# Patient Record
Sex: Female | Born: 1990 | Race: White | Hispanic: No | Marital: Single | Smoking: Never smoker
Health system: Southern US, Community
[De-identification: ages and names within clinical notes are randomized; demographics above are authoritative.]

## PROBLEM LIST (undated history)

## (undated) DIAGNOSIS — J45909 Unspecified asthma, uncomplicated: Secondary | ICD-10-CM

## (undated) DIAGNOSIS — N200 Calculus of kidney: Secondary | ICD-10-CM

## (undated) DIAGNOSIS — K922 Gastrointestinal hemorrhage, unspecified: Secondary | ICD-10-CM

## (undated) DIAGNOSIS — F419 Anxiety disorder, unspecified: Secondary | ICD-10-CM

## (undated) DIAGNOSIS — M797 Fibromyalgia: Secondary | ICD-10-CM

## (undated) DIAGNOSIS — F603 Borderline personality disorder: Secondary | ICD-10-CM

## (undated) DIAGNOSIS — E039 Hypothyroidism, unspecified: Secondary | ICD-10-CM

## (undated) DIAGNOSIS — F1410 Cocaine abuse, uncomplicated: Secondary | ICD-10-CM

## (undated) DIAGNOSIS — N760 Acute vaginitis: Secondary | ICD-10-CM

## (undated) DIAGNOSIS — F1290 Cannabis use, unspecified, uncomplicated: Secondary | ICD-10-CM

## (undated) DIAGNOSIS — E559 Vitamin D deficiency, unspecified: Secondary | ICD-10-CM

## (undated) DIAGNOSIS — G43909 Migraine, unspecified, not intractable, without status migrainosus: Secondary | ICD-10-CM

## (undated) DIAGNOSIS — U071 COVID-19: Secondary | ICD-10-CM

## (undated) DIAGNOSIS — R569 Unspecified convulsions: Secondary | ICD-10-CM

## (undated) DIAGNOSIS — F32A Depression, unspecified: Secondary | ICD-10-CM

## (undated) DIAGNOSIS — N189 Chronic kidney disease, unspecified: Secondary | ICD-10-CM

## (undated) HISTORY — PX: BUNIONECTOMY: SHX129

## (undated) HISTORY — PX: APPENDECTOMY: SHX54

## (undated) HISTORY — DX: Unspecified asthma, uncomplicated: J45.909

## (undated) HISTORY — PX: COLONOSCOPY: SHX174

## (undated) HISTORY — DX: Chronic kidney disease, unspecified: N18.9

## (undated) HISTORY — PX: OTHER SURGICAL HISTORY: SHX169

## (undated) HISTORY — DX: Depression, unspecified: F32.A

## (undated) HISTORY — DX: Fibromyalgia: M79.7

## (undated) SURGERY — Surgical Case
Anesthesia: *Unknown

## (undated) NOTE — Progress Notes (Signed)
Formatting of this note might be different from the original.  Chaplain met with pt in waiting room. Pt was tucked in a corner, waiting for more treatment. Pt was frustrated at what she considered lack of care or understanding from associates. Pt described herself as spiritual, and asked for prayer. Chaplain listened, offered words of encouragement and prayer for her circumstances.  Electronically signed by Fenton Malling, Chaplain at 06/14/2021  2:57 PM EST

## (undated) NOTE — ED Triage Notes (Signed)
Formatting of this note might be different from the original.  Pt c/o 10/10 abd pain after IUD placement.   Electronically signed by Dorothyann Gibbs, RN at 06/14/2021 10:30 AM EST

## (undated) NOTE — ED Notes (Signed)
Formatting of this note might be different from the original.  While getting her discharge paperwork and instructions pt walked out of room.     Dorothyann Gibbs, RN  06/14/21 1622    Electronically signed by Dorothyann Gibbs, RN at 06/14/2021  4:22 PM EST

## (undated) NOTE — ED Provider Notes (Signed)
Formatting of this note is different from the original.  Images from the original note were not included.      Patient Name:         Casey Hughes  Date of Birth:           03-08-1991  Medical Record #:   [id number]     HPI     Chief Complaint   Patient presents with   ? Abdominal Pain     63 year old female history of polysubstance abuse currently at [facility] heroin and cocaine abuse, fibromyalgia, anxiety depression, endometriosis presents emergency department today with lower pelvic/abdominal discomfort.  Patient states that last Thursday she recently had an IUD placed for "hormonal control" and since then she has been having excruciating "10/10" pain in her lower abdominal/pelvic area.  She states "my pain is so bad that I cannot walk".  Denies any associated fever, chills, shortness of breath, chest pain, nausea vomiting, dysuria, increased urinary frequency, melena, bloody stools, paresthesias in upper or lower extremities.  Patient was able to ambulate from waiting room to exam room as witnessed by Clinical research associate.  He states that she is followed by Dr. Luan Pulling OB/GYN "back at home".     Patient History     Allergies   Allergen Reactions   ? Ciprofloxacin Nausea   ? Lactose Diarrhea     Prior to Admission medications    Medication Sig Start Date End Date Taking? Authorizing Provider   acetaminophen (Tylenol) 500 mg tablet Take by mouth every 6 (six) hours if needed for mild pain (pain scale 1-3).    Historical Chs Provider   amphetamine-dextroamphetamine XR (Adderall XR) 10 mg 24 hr capsule Take 20 mg by mouth 1 (one) time each day in the morning. Do not crush or chew.    Historical Chs Provider   ARIPiprazole (Abilify) 10 mg tablet Take 10 mg by mouth 1 (one) time each day.    Historical Chs Provider   baclofen (Lioresal) 10 mg tablet Take by mouth 2 (two) times a day.    Historical Chs Provider   baclofen (Lioresal) 5 mg tablet tablet  10/28/19   Historical Chs Provider   cefdinir (Omnicef) 300 mg capsule  11/18/19    Historical Chs Provider   cetirizine 10 mg capsule Take 10 mg by mouth 1 (one) time each day if needed. ALLERGIES    Historical Chs Provider   clonazePAM (KlonoPIN) 0.5 mg tablet Take 0.5 mg by mouth at night if needed for anxiety.    Historical Chs Provider   DULoxetine (Cymbalta) 60 mg DR capsule Take 60 mg by mouth 1 (one) time each day. Do not crush or chew.    Historical Chs Provider   famotidine (Pepcid) 20 mg tablet Take 20 mg by mouth 2 (two) times a day if needed for heartburn.    Historical Chs Provider   fluticasone (Flonase) 50 mcg/actuation nasal spray Administer 1 spray into each nostril 1 (one) time each day. Shake gently. Before first use, prime pump. After use, clean tip and replace cap.    Historical Chs Provider   ibuprofen (Advil,Motrin) 600 mg tablet Take 600 mg by mouth every 8 (eight) hours if needed for mild pain (pain scale 1-3).    Historical Chs Provider   levonorgestrel [facility] UTRN) by intrauterine route 2 (two) times a day.    Historical Chs Provider   phenol (phenol) 1.4 % aerosol,spray Use in the mouth or throat.    Historical Chs Provider  prazosin (Minipress) 2 mg capsule Take 6 mg by mouth every night.    Historical Chs Provider   traMADoL (Ultram) 50 mg tablet  11/05/19   Historical Chs Provider   traZODone (Desyrel) 150 mg tablet Take 150 mg by mouth every night.    Historical Chs Provider     Past Medical History:   Diagnosis Date   ? Anxiety 11/07/2019   ? Depression 11/07/2019   ? Endometriosis    ? Endometriosis    ? Fibromyalgia    ? Fibromyalgia    ? Hx of cocaine abuse (CMS/HCC) 11/07/2019     Past Surgical History:   Procedure Laterality Date   ? APPENDECTOMY     ? FOOT SURGERY Left    ? KIDNEY STONE SURGERY       Family History   Problem Relation Name Age of Onset   ? Cervical cancer Mother     ? No Known Problems Father       Social History     Tobacco Use   ? Smoking status: Every Day     Packs/day: 0.25     Types: Cigarettes   ? Smokeless tobacco: Never   Substance Use  Topics   ? Alcohol use: Not Currently   ? Drug use: Not Currently     Types: Cocaine, Other     Comment: methadone      Review of Systems     Review of Systems   Constitutional: Negative for chills and fever.   HENT: Negative for ear pain and sore throat.    Eyes: Negative for pain and visual disturbance.   Respiratory: Negative for cough and shortness of breath.    Cardiovascular: Negative for chest pain and palpitations.   Gastrointestinal: Negative for abdominal pain, nausea and vomiting.   Genitourinary: Positive for pelvic pain. Negative for dysuria and hematuria.   Musculoskeletal: Negative for arthralgias and back pain.   Skin: Negative for color change and rash.   Neurological: Negative for seizures and syncope.   All other systems reviewed and are negative.     Physical Exam     ED Triage Vitals [06/14/21 1027]   Temp Pulse Resp BP   36.1 C (96.9 F) (!) 107 18 (!) 142/90     SpO2 Temp src Heart Rate Source Patient Position   97 % Temporal Oxygen saturation monitor Sitting     BP Location FiO2 (%)     Left arm --       Physical Exam  Vitals and nursing note reviewed.   Constitutional:       General: She is not in acute distress.     Appearance: She is well-developed. She is obese. She is not ill-appearing.   HENT:      Head: Normocephalic and atraumatic.   Eyes:      Conjunctiva/sclera: Conjunctivae normal.   Cardiovascular:      Rate and Rhythm: Normal rate and regular rhythm.      Heart sounds: No murmur heard.  Pulmonary:      Effort: Pulmonary effort is normal. No respiratory distress.      Breath sounds: Normal breath sounds.   Abdominal:      Palpations: Abdomen is soft.      Tenderness: There is abdominal tenderness in the suprapubic area.   Musculoskeletal:         General: No swelling.      Cervical back: Neck supple.   Skin:     General:  Skin is warm and dry.      Capillary Refill: Capillary refill takes less than 2 seconds.   Neurological:      Mental Status: She is alert and oriented to person,  place, and time.   Psychiatric:         Mood and Affect: Mood normal.         ED Course as of 06/14/21 1619   Mon Jun 14, 2021   1614 On reevaluation, patient was sitting in the corner of the waiting room and was able to ambulate all the way to the exam room without any difficulty and steady gait.  I thoroughly discussed the results of her labs and imaging as they are negative for any acute findings.  Discussed the results of blood work as her LFTs are within normal limits. Patient requesting doctor as she states "I dont believe my results by a nurse". Patient again was told her results with chaperone Randolph Bing at bedside with Clinical research associate.  Daine Floras   [phone] Counseled patient about diagnosis, the need for appropriate outpatient follow-up, return to the emergency department if symptoms worsen or persist or if there are any questions or concerns that arise at home.  After thorough patient history, exam, diagnosis, and evaluation there is no indication for emergent intervention or inpatient treatment at this time.  It is understood by the patient and/or guardian that if the symptoms persist or worsening to return immediately to the emergency room for evaluation.  I discussed with the patient/guardian the need to see primary care provider for further evaluation of symptoms.    Dragon voice recognition was used to prepare this note.  Although notes are reviewed for syntactic and/or grammatical errors, unintended but conspicuous translational errors can occur.  Please call if there are any questions about the contents of this note. [SQ]     ED Course User Index  [SQ] Letta Moynahan, NP       Clinical Impressions as of 06/14/21 1619   Generalized abdominal pain     Procedures    BP: (!) 142/90 (1027)  Pulse: (!) 107 (1027)  Resp: 18 (1027)  Temp: 36.1 C (96.9 F) (1027)  Are rigors present?: No (1030)  Is there a suspected infection?: No (1030)  Is the patient's mental status altered?: No (1030)        MDM    CT lumbar spine  reconstruction from abdomen statistical   Final Result   No acute findings     To comply with MIPS quality improvement reporting purposes, Amy described simple appearing hepatic or renal cyst (Bosniak one or 2), subcentimeter adrenal nodules, and adrenal adenomas with classic imaging features are considered benign findings into groin no follow-up imaging.     Reading Location:  [id number]     Dictated By: Mary Sella     Dictated Date/Time: 06/14/2021 3:56 PM     Electronically Signed By: Mary Sella     Signed Date/Time:  06/14/2021 4:02 PM         CT abdomen pelvis with IV contrast only [id number]   Final Result   No acute problems noted. Horseshoe kidney. IUD. Additional as above.     Reading Location:  [facility]     Dictated By: Kirk Ruths     Dictated Date/Time: 06/14/2021 3:55 PM     Electronically Signed By: Kirk Ruths     Signed Date/Time:  06/14/2021 4:02 PM     Radimetrics Dose Report^Up-to-date CT equipment and radiation  dose reduction techniques were employed. CTDIvol: 60.0 mGy. DLP: 3141 mGy-cm.     Ultrasound transvaginal non-obstetric 16109   Final Result   1. There is an IUD within the endometrial canal as expected.   2. Otherwise, unremarkable pelvic ultrasound.   3. Consider CT follow-up if symptoms persist or worsen.     Reading Location:  [id number]     Dictated By: Estrella Deeds     Dictated Date/Time: 06/14/2021 11:14 AM     Electronically Signed By: Estrella Deeds     Signed Date/Time:  06/14/2021 11:16 AM           89 year old female, HPI as above.  When seen at bedside discussed the results of ultrasound with patient as IUD is in endometrial canal as expected.  She was offered blood work and CT of the abdomen pelvis.  She is also offered pain medication however patient declined, stating that "Toradol is the same as ibuprofen and Tylenol and my doctor told me that had an enlarged liver and I cannot take Tylenol".  Patient also states "I feel like you are not giving me stronger  medication because you know I come from rehab and I feel like you think I am wasting my time".  Patient was corrected and I did discuss that I did offer her medication imaging and blood work and words such as "wasting my time" never came from Clinical research associate. States "I cant get tylenol because I was told my liver is enlarged". On review LFTs wnl. Dispo pending.    Labs Reviewed   COMPREHENSIVE METABOLIC PANEL - Abnormal       Result Value    Glucose 81      BUN 11      Creatinine, Serum 0.51 (*)     Bun/Creatinine Ratio 21.6 (*)     Sodium 137      Potassium 4.4      Chloride 102      CO2 27      Anion Gap 8      Calcium 8.9      Aspartate Aminotranferase (AST) 25      Alanine Aminotransferase (ALT) 19      Alkaline Phosphatase 97      Protein, Total 7.3      Albumin 3.9      Albumin/Globulin Ratio 1.1      Bilirubin, Total 0.3      GFR >90.0     CBC WITH AUTODIFF - Abnormal    WBC (White Blood Cell) Count 8.3      RBC 4.08 (*)     Hemoglobin 12.6      Hematocrit 37.0      MCV 90.8      MCH 30.9      MCHC 34.0      RDW 13.8      Platelets 268      MPV 6.8 (*)     Neutrophils Relative 63      Lymphocytes Relative 27      Monocytes Relative 7      Eosinophils Relative 3      Basophils Relative 1      Neutrophils Absolute 5.2      Lymphocytes Absolute 2.2      Monocytes Absolute 0.6      Eosinophils Absolute 0.2      Basophils Absolute 0.1     URINALYSIS WITH URINE CULTURE REFLEX - Abnormal    Urine Source Urine, Clean Catch  Urine Color Yellow      Urine Appearance Clear      Glucose, Urine Negative      Ketones, Urine Negative      Blood, Urine Trace (*)     Protein, Urine Negative      Nitrites, Urine Negative      Bilirubin, Urine Negative      Specific Gravity 1.025      pH, Urine 6.0      Urobilinogen, Urine 0.2      Leukocyte Esterase Negative     MICROSCOPIC REFLEX - Abnormal    Red Blood Cells, Urine 3-5 (*)     White Blood Cells, Urine 0-2      Bacteria Few (*)     Squamous Epithelial Cells 6-10 (*)    URINALYSIS  W/URINE CULTURE REFLEX    Narrative:     The following orders were created for panel order Urinalysis with Urine Culture Reflex.  Procedure                               Abnormality         Status                     ---------                               -----------         ------                     Wallace Cullens top tube (urine micro)[[id number]]                       In process                 Urinalysis with Urine Cul.Marland KitchenMarland Kitchen[[id number]]  Abnormal            Final result               Clear top tube (urine add.Marland KitchenMarland Kitchen[[id number]]                      In process                   Please view results for these tests on the individual orders.   GRAY TOP TUBE (URINE MICRO)   CLEAR TOP TUBE       CT lumbar spine reconstruction from abdomen statistical   Final Result   No acute findings     To comply with MIPS quality improvement reporting purposes, Amy described simple appearing hepatic or renal cyst (Bosniak one or 2), subcentimeter adrenal nodules, and adrenal adenomas with classic imaging features are considered benign findings into groin no follow-up imaging.     Reading Location:  [id number]     Dictated By: Mary Sella     Dictated Date/Time: 06/14/2021 3:56 PM     Electronically Signed By: Mary Sella     Signed Date/Time:  06/14/2021 4:02 PM         CT abdomen pelvis with IV contrast only [id number]   Final Result   No acute problems noted. Horseshoe kidney. IUD. Additional as above.     Reading Location:  [facility]     Dictated By: Kirk Ruths     Dictated Date/Time: 06/14/2021 3:55 PM  Electronically Signed By: Kirk Ruths     Signed Date/Time:  06/14/2021 4:02 PM     Radimetrics Dose Report^Up-to-date CT equipment and radiation dose reduction techniques were employed. CTDIvol: 60.0 mGy. DLP: 3141 mGy-cm.     Ultrasound transvaginal non-obstetric 16109   Final Result   1. There is an IUD within the endometrial canal as expected.   2. Otherwise, unremarkable pelvic ultrasound.   3. Consider CT follow-up if symptoms  persist or worsen.     Reading Location:  [id number]     Dictated By: Estrella Deeds     Dictated Date/Time: 06/14/2021 11:14 AM     Electronically Signed By: Estrella Deeds     Signed Date/Time:  06/14/2021 11:16 AM           Labs Reviewed   COMPREHENSIVE METABOLIC PANEL - Abnormal       Result Value    Glucose 81      BUN 11      Creatinine, Serum 0.51 (*)     Bun/Creatinine Ratio 21.6 (*)     Sodium 137      Potassium 4.4      Chloride 102      CO2 27      Anion Gap 8      Calcium 8.9      Aspartate Aminotranferase (AST) 25      Alanine Aminotransferase (ALT) 19      Alkaline Phosphatase 97      Protein, Total 7.3      Albumin 3.9      Albumin/Globulin Ratio 1.1      Bilirubin, Total 0.3      GFR >90.0     CBC WITH AUTODIFF - Abnormal    WBC (White Blood Cell) Count 8.3      RBC 4.08 (*)     Hemoglobin 12.6      Hematocrit 37.0      MCV 90.8      MCH 30.9      MCHC 34.0      RDW 13.8      Platelets 268      MPV 6.8 (*)     Neutrophils Relative 63      Lymphocytes Relative 27      Monocytes Relative 7      Eosinophils Relative 3      Basophils Relative 1      Neutrophils Absolute 5.2      Lymphocytes Absolute 2.2      Monocytes Absolute 0.6      Eosinophils Absolute 0.2      Basophils Absolute 0.1     URINALYSIS WITH URINE CULTURE REFLEX - Abnormal    Urine Source Urine, Clean Catch      Urine Color Yellow      Urine Appearance Clear      Glucose, Urine Negative      Ketones, Urine Negative      Blood, Urine Trace (*)     Protein, Urine Negative      Nitrites, Urine Negative      Bilirubin, Urine Negative      Specific Gravity 1.025      pH, Urine 6.0      Urobilinogen, Urine 0.2      Leukocyte Esterase Negative     MICROSCOPIC REFLEX - Abnormal    Red Blood Cells, Urine 3-5 (*)     White Blood Cells, Urine 0-2      Bacteria Few (*)  Squamous Epithelial Cells 6-10 (*)    URINALYSIS W/URINE CULTURE REFLEX    Narrative:     The following orders were created for panel order Urinalysis with Urine Culture  Reflex.  Procedure                               Abnormality         Status                     ---------                               -----------         ------                     Wallace Cullens top tube (urine micro)[[id number]]                       In process                 Urinalysis with Urine Cul.Marland KitchenMarland Kitchen[[id number]]  Abnormal            Final result               Clear top tube (urine add.Marland KitchenMarland Kitchen[[id number]]                      In process                   Please view results for these tests on the individual orders.   GRAY TOP TUBE (URINE MICRO)   CLEAR TOP TUBE     See ED course.                             Electronically signed by:    Letta Moynahan, NP  06/14/21 1619    Electronically signed by Lewie Chamber, MD at 06/14/2021  4:39 PM EST

---
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
## 2011-07-26 DIAGNOSIS — I471 Supraventricular tachycardia, unspecified: Secondary | ICD-10-CM

## 2011-07-26 HISTORY — DX: Supraventricular tachycardia, unspecified: I47.10

## 2011-07-26 HISTORY — DX: Supraventricular tachycardia: I47.1

## 2011-08-30 ENCOUNTER — Ambulatory Visit: Payer: Self-pay | Admitting: Cardiology

## 2011-08-30 ENCOUNTER — Encounter: Payer: Self-pay | Admitting: Gastroenterology

## 2011-10-04 ENCOUNTER — Ambulatory Visit: Payer: Self-pay | Admitting: Cardiology

## 2011-10-04 ENCOUNTER — Encounter: Payer: Self-pay | Admitting: Cardiology

## 2011-10-04 VITALS — BP 102/70 | HR 82 | Ht 65.5 in | Wt 199.0 lb

## 2011-10-04 DIAGNOSIS — I471 Supraventricular tachycardia: Secondary | ICD-10-CM | POA: Insufficient documentation

## 2011-10-04 NOTE — Progress Notes (Signed)
Recent Hospitalizations / ED Visits / Illnesses:  []  Yes [x]  No      Caffeine Intake:  [x]  Yes []  No  Coffee, hot tea, coke/pepsi totaling 2 small cups per day.    Diet:           Exercise:         Cardiovascular  Chest Discomfort [x]  Yes []  No   []  At rest    [x]  With exertion/ at work.  Has not happened in a month or more   []  At night   [x]  Random (no pattern)   Location: mid chest   Duration:    Pain moves to: arm   Type of pain: heavy   Other symptoms with chest pain: dizziness, feels hot and sweaty,    What do you take for the pain: nothing   Last Taken:    Swelling in Legs, Feet or Ankles:  [x]  Yes  With exercise knees go thot and swell get "itchy" []  No   Palpitations: []  Yes []  No   []  Heart Skips   [x]  Heart Races  Last episode end of February.  Chest pain with/burning with arm weakness and diaphoresis.  Pounding/fast HR.  Typical episode comes on while working at [employer].  Coming on suddenly.  Sometimes when bending over.  Takes about 5 minutes to subside, tries drinking/splashing cold water.  Takes 10 minutes to go away.  Feels weak and sick afterwards.    Has had palpitations 10 years ago.  But no symptoms since then until August-Nov.    Respiratory   Difficulty breathing []  Yes [x]  No   []  When lying down   []  At night   Shortness of Breath []  Yes []  No   [x]  with exertion/sensation in chest   []  At rest   Do you snore: []  Yes [x]  No   []  Stop breathing?   []  Excessive daytime fatigue  General   [x]  Fatigue-loggy a lot [x]  Yes []  No   [x]  Weight Gain > 10# in 29yr. States weight waxes and wanes and that this past week she "lost 12 pounds" []  Yes []  No   []  Weight Loss > 10# in 1 yr. []  Yes []  No  Neurologic   []  Fainting, dizziness []  Yes [x]  No   []  Lightheadedness-working/exertion [x]  Yes []  No   []  Blackout, near blackout []  Yes []  No   []  Anxiety [x]  Yes []  No   []  Headache []  Yes []  No   []  Depression []  Yes []  No   []  Seizures []  Yes []  No   []  Memory Loss []  Yes []  No   []  Change in sensation on  body []  Yes []  No   []  Localized weakness/numbness []  Yes []  No   []  Head Injury [x]  Yes  Mild concussion 2years ago []  No  Endocrine   []  Change in skin texture []  Yes []  No   []  Cold intolerance []  Yes []  No   []  Heat Intolerance []  Yes []  No   []  Diabetes []  Yes [x]  No  Eyes  NONE   []  Glaucoma []  Yes []  No   []  Double Vision []  Yes []  No   []  Blurred Vision []  Yes []  No  Skin  NONE    []  Excessive Sweating []  Yes []  No   []  Oily Skin []  Yes []  No   []  Dry Skin []  Yes []  No  Vascular   Pain in calves while walking []  Yes [  x] No  Gastrointestinal     [x]  Indigestion    [x]  Heartburn    [x]  Reflux    []  Ulcers    []  Hiatal Hernia    []  Gallstones    []  Appetite []  Increase []  Decrease     Bowels   [x]  Regular    []  Irregular    []  Constipation    []  Diarrhea   Urinary  NONE   []  Excessive thirst    []  urinating frequently (day)    []  urinating frequently (night)     Upcoming Surgery: NONE  Date of Surgery:   Surgeon:

## 2011-10-04 NOTE — Progress Notes (Signed)
CARDIOLOGY CONSULTATION    Dear Dr. Gilford Silvius,    Today, on 10/04/2011, I had the pleasure of seeing your patient Casey Hughes in consultation at your request for palpitations with an abnormal cardiac event monitor.  The patient complains of palpitations occurring since November occurring approximately 2-3 times every 2 weeks.  She states her symptoms occur suddenly in the form of a racing heart with associated chest discomfort, diaphoresis, and nausea.  She states her symptoms can last a total of 5-10 minutes with low cessation.  She reports approximately 2 cups of caffeine in the form of coffee, tea, or soda per day but she does not note that her symptoms are triggered by caffeine.  She does report distant marijuana use and has noted some symptoms associated with this, she has since stopped smoking marijuana.  She was seen in your office for these concerns and an event monitor was placed.  She wore the monitor from 08/16/11 until 08/23/11.  She reported 3 episodes associated with a sinus tachycardia.  At least one episode was associated with brief SVT.  She reports no recurrent symptoms in the last 2-3 weeks.      PAST MEDICAL HISTORY:   Past Medical History   Diagnosis Date   . Anxiety    . Hypothyroid    . Asthma    . Depression    . Chronic kidney disease      stone and horseshoe kidney        PAST SURGICAL HISTORY:   Past Surgical History   Procedure Date   . Lithotripsy        MEDICATIONS:   Current Outpatient Prescriptions   Medication Sig   . norethin-eth estradiol-fe (LO LOESTRIN FE) 1 MG-10 MCG / 10 MCG tablet Take 1 tablet by mouth daily       ALLERGIES:   Cipro xr     SOCIAL HISTORY: The patient lives locally and works at [employer].  She admits to distance tobacco and marijuana abuse but denies current use.  She denies alcohol use.  She does state she has approximately 2 cups of caffeinated beverages per day.    FAMILY HISTORY:   History reviewed. No pertinent family history.    REVIEW OF SYSTEMS: A complete  10+ ROS is documented in the patient's electronic chart which I reviewed & confirmed.  Pertinent positives and negatives are detailed in the HPI.    VITAL SIGNS:  Filed Vitals:    10/04/11 1317   BP: 102/70   Pulse: 82   Height: 1.664 m (5' 5.5")   Weight: 90.266 kg (199 lb)     Body mass index is 32.61 kg/(m^2).    PHYSICAL EXAMINATION: General: well appearing, sitting comfortably and in no acute distress.  Neck: supple, carotids are 2+ and without bruit bilaterally, JVP not elevated.  Pulmonary: chest expansion symmetric, clear to auscultation throughout, no wheezes, rales, or rhonchi noted.  Cardiac: regular rate, regular rhythm, normal S1 and S2 without murmur, rub, or gallop appreciated.  Abd: soft, nontender, normal bowel sounds Extremities: no edema, pedal pulses equal bilaterally    LAB DATA: TSH 1.07, free T4 1.15, sodium 140, potassium 3.8, BUN 10, creatinine 0.4  EVENT MONITOR: Sinus rhythm and sinus tachycardia, 116 with P waves difficult to find.  Regular tachycardia with no clear P waves, consistent with SVT was also observed.  No ventricular ectopy noted.    IMPRESSION/PLAN:  1.  SVT.  The patient's palpitations did correlate with very brief episodes  of SVT terminating quickly into the sinus tachycardia.  She reports no recurrent symptoms in the last 2-3 weeks.  Given that these episodes of SVT are so brief we will continue to treat this conservatively.  We did discuss avoidance triggers as well as vagal maneuvers to help abort an episode.  If she continues to experience symptomatic episodes, as needed AV nodal blockers can be considered.  We did discuss the option of electrophysiology consultation with subsequent ablation but, we do not feel this aggressive measure is indicated at this point.  We will plan for an echocardiogram to further assess cardiac structure and function and rule out any congenital cardiac abnormalities.    Thank you for the opportunity to participate in the care of this  pleasant patient.  We will plan for follow up at the time of her echocardiogram.      Sincerely,      Donah Driver, MD [facility]  Dictated by: Micah Flesher, RPA-C

## 2011-10-14 ENCOUNTER — Ambulatory Visit: Payer: Self-pay

## 2011-10-14 DIAGNOSIS — I471 Supraventricular tachycardia: Secondary | ICD-10-CM

## 2011-10-18 ENCOUNTER — Encounter: Payer: Self-pay | Admitting: Cardiology

## 2011-10-18 ENCOUNTER — Ambulatory Visit: Payer: Self-pay | Admitting: Cardiology

## 2011-10-18 VITALS — BP 118/76 | Ht 67.0 in | Wt 210.0 lb

## 2011-10-18 DIAGNOSIS — I471 Supraventricular tachycardia: Secondary | ICD-10-CM

## 2011-10-18 NOTE — Patient Instructions (Signed)
Try to exercise every day!    Call us if your palpitations get worse.

## 2011-10-18 NOTE — Progress Notes (Signed)
CARDIOLOGY FOLLOWUP     Dear Dr. Dolphus Jenny D:    Today, 10/18/2011, I had the pleasure of seeing Casey Hughes in followup for followup of her SVT.  Since we last saw her, she reports that she has had a few brief episodes of palpitations.  These have lasted for just a few minutes at a time.  She's had no associated syncope or near syncopal type symptoms and denies chest pain or dyspnea.  Vagal maneuvers have not been successful in terminating the SVT, however, the symptoms have resolved on.    The patient and I reviewed her recent echocardiogram.  It is normal with normal biventricular size and function and no significant valvular abnormality.    Allergies, PMFS, & ROS were reviewed and updated. Pertinent details as above.     MEDICATIONS:   Current Outpatient Prescriptions   Medication Sig   . norethin-eth estradiol-fe (LO LOESTRIN FE) 1 MG-10 MCG / 10 MCG tablet Take 1 tablet by mouth daily       PHYSICAL EXAM:  Blood pressure 118/76, height 1.702 m (5\' 7" ), weight 95.255 kg (210 lb), last menstrual period 09/19/2011., Body mass index is 32.89 kg/(m^2).. GEN: Pleasant, well-appearing, no acute distress. HEENT: JVP not elevated, Carotids without bruits. LUNGS: Clear to auscultation bilaterally without wheezes or crackles. CARDIOVASCULAR: Regular rate and rhythm, normal S1 and S2, no S3 or S4, no murmur, no rub. EXTREMITIES: No edema, pedal pulses intact.    IMPRESSION/PLAN:   1.  SVT.  With occasional fairly brief episodes.  At this point, EP study/ablation seems quite aggressive.  We discussed medical therapy but ultimately decided to follow the patient clinically without additional therapy.  She will try to avoid caffeine and other potential triggers of arrhythmia.  If her symptoms become more frequent, last longer, or are more bothersome to her, we could reconsider either nodal blocker therapy with a beta blocker or calcium channel blocker and/or ablation.  2.  weight.  I recommended regular exercise and  caloric restriction in an effort to lose weight.    We will plan for followup in 6 months or sooner if her symptoms dictate.    Sincerely,    Theron Arista, MD, [facility]  (dictated using Dragon Medical V10.1 software)

## 2011-10-18 NOTE — Progress Notes (Signed)
Recent Hospitalizations / ED Visits / Illnesses:  []  Yes [x]  No      Caffeine Intake:  []  Yes []  No      Diet:           Exercise:         Cardiovascular  Some since we have seen her last but they are "the same".  Chest Discomfort [x]  Yes []  No   []  At rest    []  With exertion   []  At night   []  Random (no pattern)   Location:    Duration:    Pain moves to:    Type of pain:    Other symptoms with chest pain:   What do you take for the pain:    Last Taken:    Swelling in Legs, Feet or Ankles:  []  Yes [x]  No   Palpitations: [x]  Yes []  No   []  Heart Skips  Unchanged   []  Heart Races    Respiratory   Difficulty breathing []  Yes []  No   []  When lying down   []  At night   Shortness of Breath [x]  Yes []  No   []  with exertion   []  At rest   Do you snore: []  Yes []  No   []  Stop breathing?   []  Excessive daytime fatigue  General   []  Fatigue [x]  Yes []  No   []  Weight Gain > 10# in 35yr.  11 pounds since 10/04/11 [x]  Yes []  No   []  Weight Loss > 10# in 1 yr. []  Yes []  No  Neurologic   []  Fainting, dizziness  Symptoms unchanged [x]  Yes []  No   []  Lightheadedness [x]  Yes []  No   []  Blackout, near blackout [x]  Yes []  No   []  Anxiety []  Yes []  No   []  Headache []  Yes []  No   []  Depression [x]  Yes []  No   []  Seizures []  Yes []  No   []  Memory Loss []  Yes []  No   []  Change in sensation on body []  Yes []  No   []  Localized weakness/numbness [x]  Yes []  No   []  Head Injury []  Yes []  No  Endocrine   []  Change in skin texture []  Yes []  No   []  Cold intolerance []  Yes []  No   []  Heat Intolerance []  Yes []  No   []  Diabetes []  Yes [x]  No  Eyes   []  Glaucoma []  Yes []  No   []  Double Vision []  Yes []  No   []  Blurred Vision []  Yes []  No  Skin   []  Excessive Sweating []  Yes []  No   []  Oily Skin []  Yes []  No   []  Dry Skin []  Yes []  No  Vascular   Pain in calves while walking []  Yes []  No  Gastrointestinal   []  Indigestion    []  Heartburn    []  Reflux    []  Ulcers    []  Hiatal Hernia    []  Gallstones    [x]  Appetite []  Increase []   Decrease     Bowels   [x]  Regular    []  Irregular    []  Constipation    []  Diarrhea   Urinary   []  Excessive thirst    []  urinating frequently (day)    []  urinating frequently (night)     Upcoming Surgery:   Date of Surgery:   Surgeon:

## 2012-11-30 ENCOUNTER — Encounter: Payer: Self-pay | Admitting: Gastroenterology

## 2012-12-04 ENCOUNTER — Telehealth: Payer: Self-pay

## 2012-12-04 NOTE — Telephone Encounter (Signed)
[facility]    M. Payton Spark, M.D., F.A.C.C.,F.S.C.A.I.  Silvano Bilis, M.D., F.A.C.C., F.A.S.N.C.  Edwena Blow, M.D.,F.A.C.C.,F.S.C.A.I.  Donah Driver, M.D., F.A.C.C.  Roger K. Gloris Manchester, M.D., F.A.C.C.  Colleen Can, M.D., F.A.C.C.  Robert L. Franchot Erichsen, D.O., F.A.C.Sammuel Bailiff, RPA-C  Dorena Bodo, MS, ANP- C  Suezanne Cheshire, MS, FNP  Victorio Palm. Lorelee Market, RN, FNP   Eula Flax, MS, ANP        PRE-OPERATIVE CARDIAC INFORMATION      Date: 12/04/2012   Patient:Casey Hughes  DOB: March 06, 1991     Type of surgery/procedure needed: bunionectomy    Procedure Date: 12/06/12    Doctor: Dr. Orlin Hilding    [x]    Cova Roblez  is low risk for the upcoming procedure.      [x]  She does have a history of SVT that occurs on rare occasions. She is not currently on a beta blocker. Her SVT is usually responsive to vagal maneuvers.       Sincerely,      Eula Flax, NP

## 2012-12-04 NOTE — Telephone Encounter (Signed)
Per Dr. Colin Rhein to have bunionectomy without placing her on BB.

## 2012-12-04 NOTE — Telephone Encounter (Signed)
[facility]  Misty Stanley from Dr Gypsy Lore office is calling because they saw Khala today so she could be cleared for surgery with Dr Orlin Hilding. While she was in she complained about having a rapid heart beat. They want to know what we thought about this. They were thinking about medications but they wanted our opinion. She is having bunion surgery this Thursday.   Should Misty Stanley come in and be seen or can we give her a medication?  Phone # to Dr [employer] office [phone]

## 2013-01-08 ENCOUNTER — Ambulatory Visit: Payer: Self-pay | Admitting: Cardiology

## 2013-01-08 ENCOUNTER — Encounter: Payer: Self-pay | Admitting: Cardiology

## 2013-01-08 VITALS — BP 116/60 | HR 64 | Ht 66.0 in | Wt 210.0 lb

## 2013-01-08 DIAGNOSIS — I471 Supraventricular tachycardia: Secondary | ICD-10-CM

## 2013-01-08 MED ORDER — DILTIAZEM HCL CR 120 MG PO CP12 *I*
120.0000 mg | ORAL_CAPSULE | Freq: Two times a day (BID) | ORAL | Status: DC
Start: 2013-01-08 — End: 2013-05-16

## 2013-01-08 NOTE — Progress Notes (Signed)
CARDIOLOGY FOLLOWUP     Dear Dr. Fernand Parkins, [name] A:    Today, 01/08/2013, I had the pleasure of seeing Casey Hughes in followup for her palpitations and history of supraventricular tachycardia.  Recently, the patient has been having more frequent palpitations.  She says that 3 or 4 times per week she has tended episodes where her heart beats rapidly.  At times, she feels a bit lightheaded and dizzy during these times.  However, there's been no syncope or presyncope.  She has cut back on her caffeine use and denies excessive alcohol consumption or illicit substance use.  She does not think that avoiding caffeine has changed her symptoms.    Allergies, PMFS, & ROS were reviewed and updated. Pertinent details as above.     MEDICATIONS:   Current Outpatient Prescriptions   Medication Sig   . amoxicillin (AMOXIL) 875 MG tablet    . antipyrine-benzocaine (AURALGAN) otic solution    . HYDROcodone-acetaminophen (NORCO) 5-325 MG per tablet    . hydrOXYzine HCl (ATARAX) 25 MG tablet    . omeprazole (PRILOSEC) 20 MG capsule    . ondansetron (ZOFRAN-ODT) 4 MG disintegrating tablet    . oxyCODONE-acetaminophen (PERCOCET) 5-325 MG per tablet    . venlafaxine (EFFEXOR-XR) 75 MG 24 hr capsule    . diltiazem (CARDIZEM SR) 120 MG 12 hr capsule Take 1 capsule (120 mg total) by mouth 2 times daily   . norethin-eth estradiol-fe (LO LOESTRIN FE) 1 MG-10 MCG / 10 MCG tablet Take 1 tablet by mouth daily     No current facility-administered medications for this visit.       PHYSICAL EXAM:  Blood pressure 116/60, pulse 64, height 1.676 m (5\' 6" ), weight 95.255 kg (210 lb)., Body mass index is 33.91 kg/(m^2).. GEN: Pleasant, overweight, no acute distress. HEENT: JVP not elevated, Carotids without bruits. LUNGS: Clear to auscultation bilaterally without wheezes or crackles. CARDIOVASCULAR: Regular rate and rhythm, normal S1 and S2, no S3 or S4, no murmur, no rub. EXTREMITIES: No edema, pedal pulses intact.    LABORATORY DATA:  11/30/12  include sodium 138, potassium 3.7, BUN 9, creatinine 0.5, TSH 1.77    IMPRESSION/PLAN:   1.  palpitations, SVT.  A prior event monitor showed an episode of SVT at a rate of 170 beats per minute when the patient experienced palpitations.  However, there were 4 other episodes of palpitations while she was wearing a monitor which correlated with sinus rhythm/sinus tachycardia.  The patient's palpitations have increased recently.  Therefore, we have elected to initiate medical therapy in the form of diltiazem CD 120 mg a day.  We will plan for a 48-hour Holter monitor to reassess her symptoms and tachycardia when she is on the diltiazem.    We will plan for followup in approximately one month.    Sincerely,    Theron Arista, MD, [facility]  (dictated using Dragon Medical V10.1 software)

## 2013-02-05 ENCOUNTER — Ambulatory Visit: Payer: Self-pay

## 2013-02-12 ENCOUNTER — Ambulatory Visit: Payer: Self-pay | Admitting: Cardiology

## 2013-02-17 ENCOUNTER — Encounter: Payer: Self-pay | Admitting: Gastroenterology

## 2013-02-19 ENCOUNTER — Encounter: Payer: Self-pay | Admitting: Cardiology

## 2013-02-19 ENCOUNTER — Ambulatory Visit: Payer: Self-pay | Admitting: Cardiology

## 2013-02-19 VITALS — BP 120/78 | HR 80 | Ht 66.0 in | Wt 210.0 lb

## 2013-02-19 DIAGNOSIS — I471 Supraventricular tachycardia: Secondary | ICD-10-CM

## 2013-02-19 NOTE — Progress Notes (Signed)
CARDIOLOGY FOLLOWUP     Dear Dr. Fernand Hughes, Casey Hughes A:    Today, 02/19/2013, I had the pleasure of seeing Casey Hughes in followup for her palpitations in the setting of a history of supraventricular tachycardia.  The patient has been feeling poorly in general.  She was seen in the emergency department over the weekend for back and abdominal pain along with anorexia.  She continues to experience palpitations where she feels her heart racing and she feels dyspneic.  At times she gets lightheaded with these symptoms.    The patient I reviewed her recent Holter monitor.  It shows a baseline rhythm of normal sinus/sinus tachycardia.  She had 2 episodes of palpitations correlating with sinus rhythm and a third correlating with sinus tachycardia.    Allergies, PMFS, & ROS were reviewed and updated. Pertinent details as above.     MEDICATIONS:   Current Outpatient Prescriptions   Medication Sig   . clonazePAM (KLONOPIN) 0.5 MG tablet Take 0.5 mg by mouth 2 times daily as needed      . venlafaxine (EFFEXOR-XR) 75 MG 24 hr capsule Take 75 mg by mouth 2 times daily      . diltiazem (CARDIZEM SR) 120 MG 12 hr capsule Take 1 capsule (120 mg total) by mouth 2 times daily   . norethin-eth estradiol-fe (LO LOESTRIN FE) 1 MG-10 MCG / 10 MCG tablet Take 1 tablet by mouth daily     No current facility-administered medications for this visit.       PHYSICAL EXAM:  Blood pressure 120/78, pulse 80, height 1.676 m (5\' 6" ), weight 95.255 kg (210 lb)., Body mass index is 33.91 kg/(m^2).. GEN: Pleasant, anxious appearing,  no acute distress. HEENT: JVP not elevated, Carotids without bruits. LUNGS: Clear to auscultation bilaterally without wheezes or crackles. CARDIOVASCULAR: Regular rate and rhythm, normal S1 and S2, no S3 or S4, no murmur, no rub. EXTREMITIES: No edema, pedal pulses intact.    IMPRESSION/PLAN:   1.  palpitations, history of supraventricular tachycardia.  The patient continues to experience palpitations but is no clear  arrhythmia on her recent Holter.  She did one of one episode of sinus tachycardia that occurred while working at [facility] [employer] where she was symptomatic.  She has several other medical complaints at present.  At this point, we do not appear to be dealing with a malignant arrhythmia.  I have recommended that we continue her current diltiazem for her history of SVT.  We will see her back in 3 months after repeating a Holter to reevaluate for arrhythmia.    We will plan for followup in 3 months.    Sincerely,    Theron Arista, MD, [facility]  (dictated using Dragon Medical V10.1 software)

## 2013-02-26 ENCOUNTER — Ambulatory Visit: Payer: Self-pay

## 2013-05-16 ENCOUNTER — Other Ambulatory Visit: Payer: Self-pay

## 2013-05-16 MED ORDER — DILTIAZEM HCL CR 120 MG PO CP12 *I*
120.0000 mg | ORAL_CAPSULE | Freq: Two times a day (BID) | ORAL | Status: DC
Start: 2013-05-16 — End: 2014-08-09

## 2013-05-21 ENCOUNTER — Ambulatory Visit: Payer: Self-pay | Admitting: Cardiology

## 2014-07-09 ENCOUNTER — Other Ambulatory Visit: Payer: Self-pay | Admitting: Family Medicine

## 2014-07-10 LAB — ANTINUCLEAR ANTIBODY SCREEN: ANA Screen: NEGATIVE

## 2014-07-22 ENCOUNTER — Telehealth: Payer: Self-pay | Admitting: Physical Medicine and Rehabilitation

## 2014-07-22 NOTE — Telephone Encounter (Signed)
Casey Hughes called in today, she would like to know if Dr. Arley Phenix could make an exception to see her anytime after 4pm she lives in [place] and she is relying on someone bringing her. Please call her back at [phone].    Thank you

## 2014-07-28 ENCOUNTER — Ambulatory Visit: Payer: Self-pay | Admitting: Physical Medicine and Rehabilitation

## 2014-08-09 ENCOUNTER — Emergency Department
Admission: EM | Admit: 2014-08-09 | Disposition: A | Discharge status: Home / Self Care | Payer: Self-pay | Source: Ambulatory Visit | Attending: Emergency Medicine | Admitting: Emergency Medicine

## 2014-08-09 ENCOUNTER — Encounter: Payer: Self-pay | Admitting: Obstetrics & Gynecology

## 2014-08-09 ENCOUNTER — Other Ambulatory Visit: Payer: Self-pay | Admitting: Gastroenterology

## 2014-08-09 DIAGNOSIS — M797 Fibromyalgia: Secondary | ICD-10-CM | POA: Insufficient documentation

## 2014-08-09 LAB — CBC AND DIFFERENTIAL
Baso # K/uL: 0.1 10*3/uL (ref 0.0–0.1)
Basophil %: 0.5 %
Eos # K/uL: 0.2 10*3/uL (ref 0.0–0.4)
Eosinophil %: 1.8 %
Hematocrit: 36 % (ref 34–45)
Hemoglobin: 11.8 g/dL (ref 11.2–15.7)
IMM Granulocytes #: 0 10*3/uL (ref 0.0–0.1)
IMM Granulocytes: 0.2 %
Lymph # K/uL: 2.6 10*3/uL (ref 1.2–3.7)
Lymphocyte %: 21.8 %
MCH: 28 pg/cell (ref 26–32)
MCHC: 32 g/dL (ref 32–36)
MCV: 88 fL (ref 79–95)
Mono # K/uL: 0.7 10*3/uL (ref 0.2–0.9)
Monocyte %: 6 %
Neut # K/uL: 8.4 10*3/uL — ABNORMAL HIGH (ref 1.6–6.1)
Nucl RBC # K/uL: 0 10*3/uL (ref 0.0–0.0)
Nucl RBC %: 0 /100 WBC (ref 0.0–0.2)
Platelets: 250 10*3/uL (ref 160–370)
RBC: 4.2 MIL/uL (ref 3.9–5.2)
RDW: 13.2 % (ref 11.7–14.4)
Seg Neut %: 69.7 %
WBC: 12 10*3/uL — ABNORMAL HIGH (ref 4.0–10.0)

## 2014-08-09 LAB — COMPREHENSIVE METABOLIC PANEL
ALT: 9 U/L (ref 0–35)
AST: 18 U/L (ref 0–35)
Albumin: 3.9 g/dL (ref 3.5–5.2)
Alk Phos: 79 U/L (ref 35–105)
Anion Gap: 17 — ABNORMAL HIGH (ref 7–16)
Bilirubin,Total: <0.2 mg/dL (ref 0.0–1.2)
CO2: 22 mmol/L (ref 20–28)
Calcium: 8.5 mg/dL — ABNORMAL LOW (ref 9.0–10.4)
Chloride: 102 mmol/L (ref 96–108)
Creatinine: 0.58 mg/dL (ref 0.51–0.95)
GFR,Black: 150 *
GFR,Caucasian: 130 *
Glucose: 90 mg/dL (ref 60–99)
Lab: 9 mg/dL (ref 6–20)
Potassium: 3.7 mmol/L (ref 3.3–5.1)
Sodium: 141 mmol/L (ref 133–145)
Total Protein: 7.1 g/dL (ref 6.3–7.7)

## 2014-08-09 LAB — HM HIV SCREENING OFFERED

## 2014-08-09 LAB — TROPONIN T: Troponin T: <0.01 ng/mL (ref 0.00–0.02)

## 2014-08-09 MED ORDER — SODIUM CHLORIDE 0.9 % IV BOLUS *I*
1000.0000 mL | Freq: Once | Status: AC
Start: 2014-08-09 — End: 2014-08-10
  Administered 2014-08-10: 1000 mL via INTRAVENOUS

## 2014-08-09 NOTE — ED Notes (Signed)
Pt with multiple complaints including CP, R arm pain with inability to move, left arm pain, nausea, bruising, memory loss, and smelling "toast." Friends at bedside. Pt endorses having 2 beers tonight. IV placed and labs drawn. Will continue to monitor and treat patient per provider orders

## 2014-08-09 NOTE — First Provider Contact (Signed)
ED Medical Screening Exam Note    Initial provider evaluation performed by   ED First Provider Contact     Date/Time Event User Comments    08/09/14 1935 ED Provider First Contact Selina CooleyOURTNEY, Rees Santistevan Initial Face to Face Provider Contact      pt states that she thinks she is having a medication induced heart attack. Pt states that she feelks dizzy, her arms hurt, she has chest pain and numbness in the legs  PMH obesity, svt,horseshoe kidney issues, gerd , hypothyroid.    Vital signs reviewed.    Orders placed:  EKG, LABS and XRAYS     Patient requires further evaluation.     Selina CooleyKathleen Gustin Zobrist, NP, 08/09/2014, 7:35 PM    Supervising physician Dr Neil Crouchrane was immediately available

## 2014-08-09 NOTE — ED Provider Notes (Addendum)
History     Chief Complaint   Patient presents with    Arm Pain    Dizziness       HPI Comments: 24 y/o with PMH of SVT, anxiety and depression, hypothyroid, GERD and fibromyalgia presents to the ED with multiple vague complaints. She states that at about 4 pm (~7hrs ago) she started having right arm pain. She states she cannot move her arm and she feels like "something is hugging her arm." Currently she states she feels numb from her elbow to her hand, but has pain from her shoulder to her elbow. Approximately an hour later she developed substernal chest pain that has gotten worse as well. She states it feels like a stabbing pain. This ahppened while she was riding in a car with her parents. She denies any trauma or arguments at the time of the onset of pain. She states, the pain is different from acid reflux and it hurts when she pushes on her sternum. She then became dizzy and nauseated, but did not vomit. She also states she feels week and "like I am in a dream." Lastly, she states she smells burning toast.           History provided by:  Patient      Past Medical History   Diagnosis Date    Anxiety     Hypothyroid     Depression     Chronic kidney disease      stone and horseshoe kidney    Fibromyalgia             Past Surgical History   Procedure Laterality Date    Lithotripsy      Bunionectomy      Appendectomy         No family history on file.      Social History      reports that she has never smoked. She does not have any smokeless tobacco history on file. She reports that she drinks alcohol. She reports that she does not currently engage in sexual activity. She reports using the following method of birth control/protection: OCP. She reports that she does not use illicit drugs.    Living Situation     Questions Responses    Patient lives with Family    Homeless No    Caregiver for other family member     External Services     Employment     Domestic Violence Risk           Problem List      Patient Active Problem List   Diagnosis Code    Calculi, ureter N20.1    GERD (gastroesophageal reflux disease) K21.9    Hypothyroid E03.9    Bronchitis J40    Pneumonia J18.9    Anxiety F41.9    Depression F32.9    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    Renal calculi N20.0    SVT (supraventricular tachycardia) I47.1    Fibromyalgia M79.7       Review of Systems   Review of Systems   Constitutional: Positive for chills. Negative for fever.   HENT: Negative for congestion, rhinorrhea and sore throat.    Respiratory: Positive for chest tightness and shortness of breath.    Cardiovascular: Positive for chest pain and palpitations.   Gastrointestinal: Positive for nausea. Negative for vomiting, diarrhea and anal bleeding.   Endocrine: Positive for heat intolerance.   Genitourinary: Negative for dysuria and hematuria.   Neurological: Positive for light-headedness and  numbness. Negative for syncope and headaches.       Physical Exam     ED Triage Vitals   BP Heart Rate Heart Rate (via Pulse Ox) Resp Temp Temp src SpO2 O2 Device O2 Flow Rate   08/09/14 1934 08/09/14 1934 -- 08/09/14 1938 08/09/14 1934 -- 08/09/14 1934 08/09/14 1934 --   152/110 mmHg 135  12 36.6 C (97.9 F)  100 % None (Room air)       Weight           --                          Physical Exam   Constitutional: She appears well-developed and well-nourished.   Pulmonary/Chest: Effort normal and breath sounds normal.   Abdominal: Soft. Bowel sounds are normal. There is tenderness in the right lower quadrant.   Neurological: She is alert. A cranial nerve deficit is present.   Reflex Scores:       Tricep reflexes are 2+ on the right side and 2+ on the left side.       Brachioradialis reflexes are 2+ on the right side and 2+ on the left side.  Skin: Skin is warm and dry.       Medical Decision Making        Initial Evaluation:  ED First Provider Contact     Date/Time Event User Comments    08/09/14 1935 ED Provider First Contact Selina Cooley Initial  Face to Face Provider Contact          Patient seen by me today 08/09/2014 at 1140pm    Assessment:  24 y.o., female comes to the ED with multiple complaints including chest pain, SOB, arm pain and numbness, dizziness and now RLQ pain.    Differential Diagnosis includes somatization, GERD, PE, ischemic event, UTI, thyroid disorder, infectious process    Plan:    - EKG: shows sinus tachycardia  - Troponin <0.01 and leukocytosis   - Will give TUMs now for possible acid reflux and decreased calcium (8.5)  - D-dimer and CXR  - UA and POCT pregnancy test  - Tylenol 650 mg q4hrs PRN for pain    Argentina Donovan, MD  Ob/Gyn, PGY-1  Pager - [phone]      Hanks, Fran Lowes, MD  Resident  08/10/14 0014    Hanks, Fran Lowes, MD  Resident  08/10/14 0038        Patient seen by me today, 08/10/2014 at 12:10 AM.    History:   I reviewed this patient, reviewed the resident note and agree     Exam:    I examined this patient, reviewed the resident note and agree     Decision Making:   I discussed with the documented resident decision making and agree     ED  Diagnosis:   Atypical chest pain;  right upper extremity pain, paresthesias & weakness.    Author Hayes Ludwig, MD          Hayes Ludwig, MD  08/10/14 [phone]

## 2014-08-09 NOTE — ED Notes (Signed)
C/o bilateral arm pain since 4pm, states she feels weak, dizzy, palpitations. Patient [facility]

## 2014-08-10 ENCOUNTER — Other Ambulatory Visit: Payer: Self-pay | Admitting: Neurology

## 2014-08-10 DIAGNOSIS — R29898 Other symptoms and signs involving the musculoskeletal system: Secondary | ICD-10-CM

## 2014-08-10 LAB — URINALYSIS REFLEX TO CULTURE
Ketones, UA: NEGATIVE
Leuk Esterase,UA: NEGATIVE
Nitrite,UA: NEGATIVE
Protein,UA: NEGATIVE mg/dL
Specific Gravity,UA: 1.009 (ref 1.002–1.030)
pH,UA: 8 (ref 5.0–8.0)

## 2014-08-10 LAB — D-DIMER, QUANTITATIVE: D-Dimer: 0.52 ug/mL FEU — ABNORMAL HIGH (ref 0.00–0.50)

## 2014-08-10 LAB — BHCG, QUANT PREGNANCY: BHCG, QUANT PREGNANCY: <1 m[IU]/mL (ref 0–1)

## 2014-08-10 LAB — TSH: TSH: 0.91 u[IU]/mL (ref 0.27–4.20)

## 2014-08-10 LAB — URINE MICROSCOPIC (IQ200)
RBC,UA: <1 /hpf (ref 0–2)
WBC,UA: <1 /hpf (ref 0–5)

## 2014-08-10 LAB — T4, FREE: Free T4: 1.4 ng/dL (ref 0.9–1.7)

## 2014-08-10 MED ORDER — CALCIUM CARBONATE ANTACID 500 MG PO CHEW *I*
1000.0000 mg | CHEWABLE_TABLET | Freq: Two times a day (BID) | ORAL | Status: DC
Start: 2014-08-10 — End: 2014-08-10
  Filled 2014-08-10 (×2): qty 2

## 2014-08-10 MED ORDER — ACETAMINOPHEN 325 MG PO TABS *I*
650.0000 mg | ORAL_TABLET | ORAL | Status: DC | PRN
Start: 2014-08-10 — End: 2014-08-10

## 2014-08-10 MED ORDER — IOHEXOL 350 MG/ML (OMNIPAQUE) IV SOLN *I*
1.0000 mL | Freq: Once | INTRAVENOUS | Status: AC
Start: 2014-08-10 — End: 2014-08-10
  Administered 2014-08-10: 70 mL via INTRAVENOUS

## 2014-08-10 NOTE — Discharge Instructions (Signed)
You were seen here in the Emergency Department for chest pain, dizziness, and right arm pain with numbness. Your blood work, imaging and the remainder of your physical exam was reassuring and you were felt safe to go home. Some illnesses are best understood/treated over time.      Neurology saw you while you were in the ED and made a plan to see you outpatient to further work-up your symptoms. Call them first thing tomorrow to schedule an appointment.     As always when coming to the emergency department, please follow up with your primary care provider in 3-5 days. Call first thing in the morning for an appointment and let them know that you were seen in the emergency department today.     Always remember you can return to the Emergency Department night or day for significant issues. Please return if you experience any fever >101F, chills, worsening pain not controlled with medications, chest pain, shortness of breath, nausea, vomiting, or any other worsening or concerning symptoms.

## 2014-08-10 NOTE — ED Provider Progress Notes (Signed)
ED Provider Progress Note    Pt reporting arm numbness and weakness are gone, but it is still painful. She reports ongoing chest pain. New friend at bedside with her.   Slight leukocytosis (WBC 12). Normal CT head, angio and cxr.  Neuro consulted for arm numbness/pain vs possible conversion d/o       Stefani DamaLaura Renee Malikah Lakey, MD, 08/10/2014, 5:39 AM

## 2014-08-10 NOTE — ED Notes (Signed)
Patient resting.  Family at bedside. Will continue to monitor and treat per orders.

## 2014-08-10 NOTE — Provider Consult (Signed)
NEUROLOGY Consult/Admission Note    Referring Provider/Service: ED    Reason for consult: RUE paresis/numbness    History of Present Illness:  24 y.o. female with PMH of SVT, anxiety and depression, hypothyroid, migraines and fibromyalgia who presented to the ED with multiple complaints including right upper extremity numbness and weakness and bilateral arm pain.      Around 4pm she noticed pain in the right upper arm and inability to feel or move the right lower arm. She also then noted bilateral arm pain. Other complaints reported to ED included nausea, bruising, memory loss, and smelling toast. At the time of my evaluation, her numbness and weakness was nearly resolved but she continued to have pain in the upper arm. She attributed her symptoms to recently starting Savella, thinking this is causing a heart attack. She notes increased stress at work due to not getting along with a co-worker.  She acknowledges monthly migraine headaches.     Past Medical History   Diagnosis Date    Anxiety     Hypothyroid     Depression     Chronic kidney disease      stone and horseshoe kidney    Fibromyalgia      Past Surgical History   Procedure Laterality Date    Lithotripsy      Bunionectomy      Appendectomy       No family history on file.  History     Social History    Marital Status: Single     Spouse Name: N/A     Number of Children: N/A    Years of Education: N/A     Social History Main Topics    Smoking status: Never Smoker     Smokeless tobacco: None    Alcohol Use: Yes      Comment: 2 ciders/beers twice a month    Drug Use: No      Comment: quit 11/11    Sexual Activity: Not Currently     Birth Control/ Protection: OCP     Other Topics Concern    None     Social History Narrative     Allergies   Allergen Reactions    Cipro Xr      Bothered stomach       Prior to Admission Medications:  Prior to Admission medications    Medication Sig Start Date End Date Taking? Authorizing Provider   milnacipran HCl  (SAVELLA) 12.5 & 25 & 50 MG titration pack Take by mouth   Day 1: 12.5 mg qd; Days 2-3: 12.5 mg bid; Days 4-7: 25 mg bid; then 50 mg bid   Yes [provider]   norethin-eth estradiol-fe (LO LOESTRIN FE) 1 MG-10 MCG / 10 MCG tablet Take 1 tablet by mouth daily   Yes [provider]       Active Hospital Medications:  Scheduled Meds:   calcium carbonate  1,000 mg Oral 2 times per day     Continuous Infusions:   PRN Meds:.acetaminophen    Review of Systems: as per HPI    Physical Exam  Temp:  [36.6 C (97.9 F)-36.8 C (98.2 F)] 36.8 C (98.2 F)  Heart Rate:  [100-135] 100  Resp:  [12-18] 18  BP: (120-152)/(70-110) 125/86 mmHg    Medical Examination:  General: NAD, resting comfortably in bed  HEENT:  +photophobia, NC/AT, piercing in nose  Cardiac: RRR, no m/r/g  Pulmonary: CTA b/l  Extremities:  No lower extremity edema  Neurological Examination:  Mental Status: Awake and alert. Oriented to person, place, and time.  Fluent speech with no dysarthria.   Cranial Nerves:       II: unable to evaluate eyes due to photophobia    III/IV/VI: Versions intact without nystagmus, no gaze preference.    V: Facial sensation symmetric to light touch    VII: Facial expression symmetric    VIII: Hearing intact to voice    IX/X: Palate elevates symmetrically    XI: Shoulder shrug symmetric    XII: Tongue midline  Motor: Bulk, tone, and strength were normal throughout. Pronator drift was absent.   Sensory: Sensation to light touch was variable, with sensation feeling altered in some extremities. Temperature sensation reduced in RUE. Vibration intact.  Romberg was absent.  Coordination: Finger to nose intact.  Reflexes: 2+ throughout the upper and lower extremities with downgoing toes bilaterally.  Gait: Narrow based and normal.     Lab Results:  BMP with AG 17, otherwise unremarkable  CBC with WBC 12    Imaging:  CT head normal    Assessment: Casey Hughes is a 24 y.o. female with PMH of fibromyalgia,  anxiety/depression and migraines who presents with multiple complaints including RUE numbness, weakness and pain that is resolving spontaneously. Exam is unremarkable and imaging is normal. Most likely explanation of symptoms is musculoskeletal complaints due to fibromyalgia, although migraines may be a contributor. While a neurological disease is less likely, imaging to rule out a demyelinating cervical lesion as the etiology of her symptoms is warranted as an outpatient.     Plan:   - no further acute neurological workup  - MR head and cervical spine as outpatient  - follow up in neurology clinic  - patient seen with neurology attending Dr Gaynell FaceMarshall    Author: Milus Banisterarolyn E Janesha Brissette, MD  as of: 08/10/2014  at: 4:54 AM

## 2014-08-10 NOTE — ED Provider Progress Notes (Signed)
ED Provider Progress Note    Assumed care of patient from Dr Hanks.    Awaiting Neurology consult.     Dispo pending      9:22 AM  Neurology cleared patient for discharge. Patient will follow-up with them outpatient for further work-up.     Tildon HuskyAlecia Rosibel Giacobbe, DO, 08/10/2014, 7:17 AM

## 2014-08-10 NOTE — ED Notes (Signed)
Discussed discharge papers with patient.  Patient verbalized understanding.  IV removed.  Patient being driven home by family.

## 2014-08-10 NOTE — ED Provider Progress Notes (Signed)
ED Provider Progress Note    Per RN pt refusing Tums. In to explain to pt that the TUMs can help with her low calcium and will probably help resolve her chest pain that is most likely d/t acid reflux. Pt understands and agrees with plan.       Stefani DamaLaura Renee Jaysiah Marchetta, MD, 08/10/2014, 6:16 AM

## 2014-08-11 LAB — SPEC COAG REVIEW

## 2014-08-11 LAB — REVIEWED BY:

## 2014-08-11 LAB — INTERPRETATION,SPEC COAG

## 2014-08-12 LAB — EKG 12-LEAD
P: 41 degrees
QRS: 96 degrees
Rate: 120 {beats}/min
Severity: BORDERLINE
Severity: BORDERLINE
Statement: BORDERLINE
Statement: BORDERLINE
T: 1 degrees

## 2014-08-13 ENCOUNTER — Telehealth: Payer: Self-pay | Admitting: Neurology

## 2014-08-13 NOTE — Telephone Encounter (Signed)
-----   Message from Milus Banisterarolyn E Zyloney, MD sent at 08/10/2014 10:21 AM EST -----  Regarding: Needs appointment  Hello AC1    Ms Casey Hughes was discharged after being seen by myself and Dr Gaynell FaceMarshall for right arm weakness. She  will need to follow up with me in clinic non-urgently. Please call her to offer her an appointment, I can fit her in any open 3pm slot in February or at any of my open FUV spots in April. She will also need an MRI which I am ordering.   Thank you    Milus Banisterarolyn E Zyloney, MD

## 2014-08-13 NOTE — Telephone Encounter (Signed)
No vm box set up

## 2014-09-01 ENCOUNTER — Ambulatory Visit
Admit: 2014-09-01 | Discharge: 2014-09-01 | Disposition: A | Discharge status: Home / Self Care | Payer: Self-pay | Source: Ambulatory Visit | Attending: Neurology | Admitting: Neurology

## 2014-09-01 LAB — POCT URINE PREGNANCY: Lot #: 701626

## 2014-09-01 MED ORDER — GADOBUTROL 1 MMOL/ML (GADAVIST) IV SOLN *WRAPPED*
10.0000 mL | Freq: Once | INTRAVENOUS | Status: AC
Start: 2014-09-01 — End: 2014-09-01
  Administered 2014-09-01: 10 mL via INTRAVENOUS

## 2014-09-01 NOTE — Progress Notes (Signed)
23G butterfly, +blood return/thrill pre and post injection

## 2014-09-02 ENCOUNTER — Telehealth: Payer: Self-pay | Admitting: Neurology

## 2014-09-02 NOTE — Telephone Encounter (Signed)
MRI results reviewed, unremarkable.    Attempted to contact patient at listed phone number to communicate results. Phone rang and then there was automated message stating voicemail was not set up.    Upon reviewing chart, I had recommended that staff call patient to offer appointment but they were also unable to reach her in the past.    Will send letter summarizing results and providing our contact number so that patient can call if she has questions or wants to make an appointment. I have routed this letter to Neurology staff to mail at their earliest convenience.    Milus Banisterarolyn E Auriella Wieand, MD

## 2014-09-02 NOTE — Telephone Encounter (Signed)
Mailed letter °

## 2014-12-06 ENCOUNTER — Other Ambulatory Visit: Payer: Self-pay | Admitting: Neurosurgery

## 2014-12-06 LAB — URINALYSIS REFLEX TO CULTURE
Bilirubin,Ur: NEGATIVE
Blood,UA: NEGATIVE
Glucose,UA: NEGATIVE mg/dL^mg/dL
Ketones, UA: NEGATIVE mg/dL^mg/dL
Nitrite,UA: NEGATIVE
Protein,UA: NEGATIVE mg/dL^mg/dL
RBC,UA: 0 /HPF^/HPF (ref 0–5)
Renal Epithel,UA: 5 /HPF^/HPF
Specific Gravity,UA: 1.013 (ref 1.005–1.030)
Urobilinogen,UA: 0.2 mg/dL^mg/dL
WBC,UA: 4 /HPF^/HPF (ref 0–5)
pH,UA: 7 (ref 5.0–8.0)

## 2014-12-06 LAB — CBC AND DIFFERENTIAL
Baso # K/uL: 0 10*3/uL (ref 0.0–0.1)
Basophil %: 0.2 %^% (ref 0.1–1.2)
Eos # K/uL: 0.2 10*3/uL (ref 0.0–0.4)
Eosinophil %: 1.8 %^% (ref 0.7–5.8)
Hematocrit: 40.5 %^% (ref 34.1–44.9)
Hemoglobin: 13.7 g/dL^g/dL (ref 11.2–15.7)
Immature Granulocytes Absolute: 0.03 10*3/uL (ref 0.0–0.2)
Immature Granulocytes: 0.4 %^% (ref 0.0–2.0)
Lymph # K/uL: 2.2 10*3/uL (ref 1.2–3.7)
Lymphocyte %: 26.4 %^% (ref 19.3–51.7)
MCH: 29 pg^pg (ref 25.6–32.2)
MCHC: 33.8 g/dL^g/dL (ref 32.2–35.5)
MCV: 85.8 fL^fL (ref 79.4–94.8)
Mono # K/uL: 0.5 10*3/uL (ref 0.2–0.9)
Monocyte %: 5.9 %^% (ref 4.7–12.5)
Neut # K/uL: 5.5 10*3/uL (ref 1.6–6.0)
Nucl RBC %: 0 /100 WBC^/100 WBC (ref 0.0–0.2)
Platelets: 291 10*3/uL (ref 182–369)
RBC Distribution Width-SD: 40.1 fL^fL (ref 36.4–46.3)
RBC: 4.72 10*6/uL (ref 3.93–5.22)
RDW: 12.8 %^% (ref 11.7–14.4)
Seg Neut %: 65.3 %^% (ref 34.0–71.1)
WBC: 8.4 10*3/uL (ref 4.0–10.0)

## 2014-12-06 LAB — LIPASE: Lipase: 124 U/L^U/L (ref 73–393)

## 2014-12-06 LAB — COMPREHENSIVE METABOLIC PANEL
ALT: 20 U/L^U/L (ref 12–78)
AST: 21 U/L^U/L (ref 11–37)
Albumin: 3.1 g/dL^g/dL — ABNORMAL LOW (ref 3.4–5.0)
Alk Phos: 104 U/L^U/L (ref 45–117)
Anion Gap: 8 (ref 3–11)
Bilirubin,Total: 0.3 mg/dL^mg/dL (ref 0.2–1.0)
CO2: 27 meq/L^meq/L (ref 21–32)
Calcium: 8.3 mg/dL^mg/dL — ABNORMAL LOW (ref 8.5–10.1)
Chloride: 104 meq/L^meq/L (ref 98–107)
Creatinine: 0.4 mg/dL^mg/dL — ABNORMAL LOW (ref 0.6–1.3)
GFR,Black: 239 mL/min/{1.73_m2}
GFR,Caucasian: 198 mL/min/{1.73_m2}
Glucose: 72 mg/dL^mg/dL (ref 70–100)
Lab: 6 mg/dL^mg/dL — ABNORMAL LOW (ref 7–18)
Potassium: 3.7 meq/L^meq/L (ref 3.5–5.1)
Sodium: 139 meq/L^meq/L (ref 136–145)
Total Protein: 7.7 g/dL^g/dL (ref 6.4–8.2)

## 2014-12-06 LAB — PREGNANCY TEST, SERUM: Preg,Serum: NEGATIVE

## 2015-01-29 ENCOUNTER — Encounter: Payer: Self-pay | Admitting: Emergency Medicine

## 2015-01-29 ENCOUNTER — Emergency Department
Admission: EM | Admit: 2015-01-29 | Discharge: 2015-01-29 | Disposition: A | Discharge status: Home / Self Care | Payer: Medicaid Other | Attending: Emergency Medicine | Admitting: Emergency Medicine

## 2015-01-29 DIAGNOSIS — B9689 Other specified bacterial agents as the cause of diseases classified elsewhere: Secondary | ICD-10-CM

## 2015-01-29 DIAGNOSIS — N760 Acute vaginitis: Secondary | ICD-10-CM | POA: Diagnosis not present

## 2015-01-29 DIAGNOSIS — N739 Female pelvic inflammatory disease, unspecified: Secondary | ICD-10-CM | POA: Insufficient documentation

## 2015-01-29 DIAGNOSIS — Z3202 Encounter for pregnancy test, result negative: Secondary | ICD-10-CM | POA: Diagnosis not present

## 2015-01-29 DIAGNOSIS — R109 Unspecified abdominal pain: Secondary | ICD-10-CM | POA: Diagnosis present

## 2015-01-29 HISTORY — DX: Calculus of kidney: N20.0

## 2015-01-29 LAB — CBC WITH DIFFERENTIAL/PLATELET
BASOS PCT: 0 %
Basophils Absolute: 0.1 10*3/uL (ref 0–0.1)
EOS ABS: 0.1 10*3/uL (ref 0–0.7)
Eosinophils Relative: 1 %
HEMATOCRIT: 43.2 % (ref 35.0–47.0)
HEMOGLOBIN: 14 g/dL (ref 12.0–16.0)
Lymphocytes Relative: 28 %
Lymphs Abs: 3.9 10*3/uL — ABNORMAL HIGH (ref 1.0–3.6)
MCH: 28.4 pg (ref 26.0–34.0)
MCHC: 32.4 g/dL (ref 32.0–36.0)
MCV: 87.8 fL (ref 80.0–100.0)
MONOS PCT: 6 %
Monocytes Absolute: 0.8 10*3/uL (ref 0.2–0.9)
NEUTROS ABS: 9.2 10*3/uL — AB (ref 1.4–6.5)
Neutrophils Relative %: 65 %
Platelets: 272 10*3/uL (ref 150–440)
RBC: 4.92 MIL/uL (ref 3.80–5.20)
RDW: 13.5 % (ref 11.5–14.5)
WBC: 14 10*3/uL — ABNORMAL HIGH (ref 3.6–11.0)

## 2015-01-29 LAB — PREGNANCY, URINE: Preg Test, Ur: NEGATIVE

## 2015-01-29 LAB — URINALYSIS COMPLETE WITH MICROSCOPIC (ARMC ONLY)
Bacteria, UA: NONE SEEN
Bilirubin Urine: NEGATIVE
Glucose, UA: NEGATIVE mg/dL
HGB URINE DIPSTICK: NEGATIVE
Ketones, ur: NEGATIVE mg/dL
Leukocytes, UA: NEGATIVE
NITRITE: NEGATIVE
PH: 6 (ref 5.0–8.0)
Protein, ur: NEGATIVE mg/dL
Specific Gravity, Urine: 1.019 (ref 1.005–1.030)

## 2015-01-29 LAB — COMPREHENSIVE METABOLIC PANEL
ALK PHOS: 86 U/L (ref 38–126)
ALT: 17 U/L (ref 14–54)
AST: 23 U/L (ref 15–41)
Albumin: 4 g/dL (ref 3.5–5.0)
Anion gap: 9 (ref 5–15)
BUN: 11 mg/dL (ref 6–20)
CO2: 24 mmol/L (ref 22–32)
Calcium: 8.9 mg/dL (ref 8.9–10.3)
Chloride: 102 mmol/L (ref 101–111)
Creatinine, Ser: 0.61 mg/dL (ref 0.44–1.00)
GFR calc Af Amer: >60 mL/min (ref >60)
Glucose, Bld: 79 mg/dL (ref 65–99)
POTASSIUM: 4 mmol/L (ref 3.5–5.1)
SODIUM: 135 mmol/L (ref 135–145)
Total Bilirubin: 0.4 mg/dL (ref 0.3–1.2)
Total Protein: 8.2 g/dL — ABNORMAL HIGH (ref 6.5–8.1)

## 2015-01-29 LAB — WET PREP, GENITAL
Trich, Wet Prep: NONE SEEN
Yeast Wet Prep HPF POC: NONE SEEN

## 2015-01-29 LAB — LIPASE, BLOOD: Lipase: 31 U/L (ref 22–51)

## 2015-01-29 MED ORDER — METRONIDAZOLE 500 MG PO TABS: 500.0000 mg | ORAL_TABLET | Freq: Two times a day (BID) | ORAL | Status: AC

## 2015-01-29 MED ORDER — CEFTRIAXONE SODIUM 1 G IJ SOLR
500.0000 mg | Freq: Once | INTRAMUSCULAR | Status: AC
  Administered 2015-01-29: 500 mg via INTRAMUSCULAR
  Filled 2015-01-29: qty 10

## 2015-01-29 MED ORDER — OXYCODONE-ACETAMINOPHEN 5-325 MG PO TABS
1.0000 | ORAL_TABLET | Freq: Once | ORAL | Status: AC
Start: 2015-01-29 — End: 2015-01-29
  Administered 2015-01-29: 1 via ORAL
  Filled 2015-01-29: qty 1

## 2015-01-29 MED ORDER — LIDOCAINE HCL (PF) 1 % IJ SOLN
2.1000 mL | Freq: Once | INTRAMUSCULAR | Status: AC
  Administered 2015-01-29: 2.1 mL
  Filled 2015-01-29: qty 5

## 2015-01-29 MED ORDER — AZITHROMYCIN 250 MG PO TABS
1000.0000 mg | ORAL_TABLET | Freq: Once | ORAL | Status: AC
  Administered 2015-01-29: 1000 mg via ORAL
  Filled 2015-01-29: qty 4

## 2015-01-29 MED ORDER — ACETAMINOPHEN-CODEINE #3 300-30 MG PO TABS: 1.0000 | ORAL_TABLET | ORAL | Status: AC | PRN

## 2015-01-29 NOTE — Discharge Instructions (Signed)
Bacterial Vaginosis Bacterial vaginosis is a vaginal infection that occurs when the normal balance of bacteria in the vagina is disrupted. It results from an overgrowth of certain bacteria. This is the most common vaginal infection in women of childbearing age. Treatment is important to prevent complications, especially in pregnant women, as it can cause a premature delivery. CAUSES  Bacterial vaginosis is caused by an increase in harmful bacteria that are normally present in smaller amounts in the vagina. Several different kinds of bacteria can cause bacterial vaginosis. However, the reason that the condition develops is not fully understood. RISK FACTORS Certain activities or behaviors can put you at an increased risk of developing bacterial vaginosis, including:  Having a new sex partner or multiple sex partners.  Douching.  Using an intrauterine device (IUD) for contraception. Women do not get bacterial vaginosis from toilet seats, bedding, swimming pools, or contact with objects around them. SIGNS AND SYMPTOMS  Some women with bacterial vaginosis have no signs or symptoms. Common symptoms include:  Grey vaginal discharge.  A fishlike odor with discharge, especially after sexual intercourse.  Itching or burning of the vagina and vulva.  Burning or pain with urination. DIAGNOSIS  Your health care provider will take a medical history and examine the vagina for signs of bacterial vaginosis. A sample of vaginal fluid may be taken. Your health care provider will look at this sample under a microscope to check for bacteria and abnormal cells. A vaginal pH test may also be done.  TREATMENT  Bacterial vaginosis may be treated with antibiotic medicines. These may be given in the form of a pill or a vaginal cream. A second round of antibiotics may be prescribed if the condition comes back after treatment.  HOME CARE INSTRUCTIONS   Only take over-the-counter or prescription medicines as  directed by your health care provider.  If antibiotic medicine was prescribed, take it as directed. Make sure you finish it even if you start to feel better.  Do not have sex until treatment is completed.  Tell all sexual partners that you have a vaginal infection. They should see their health care provider and be treated if they have problems, such as a mild rash or itching.  Practice safe sex by using condoms and only having one sex partner. SEEK MEDICAL CARE IF:   Your symptoms are not improving after 3 days of treatment.  You have increased discharge or pain.  You have a fever. MAKE SURE YOU:   Understand these instructions.  Will watch your condition.  Will get help right away if you are not doing well or get worse. FOR MORE INFORMATION  Centers for Disease Control and Prevention, Division of STD Prevention: www.cdc.gov/std American Sexual Health Association (ASHA): www.ashastd.org  Document Released: 07/11/2005 Document Revised: 05/01/2013 Document Reviewed: 02/20/2013 Exit[employer] Patient Information 2015 [employer]. This information is not intended to replace advice given to you by your health care provider. Make sure you discuss any questions you have with your health care provider.  

## 2015-01-29 NOTE — ED Notes (Signed)
Patient states that she started with right lower abd pain two days ago and now radiating to right lower back. Patient states that she does have pain with urination.

## 2015-01-29 NOTE — ED Provider Notes (Signed)
[facility] Emergency Department Provider Note ____________________________________________  Time seen: Approximately 9:42 PM  I have reviewed the triage vital signs and the nursing notes.   HISTORY  Chief Complaint Flank Pain   HPI Carla Gutierrez is a 24 y.o. female who presents to the emergency department for evaluation of pelvic and flank pain. She states that symptoms started 2 days ago. She admits to having vaginal discharge that started around the same time. She denies a fever, nausea, vomiting, constipation, or diarrhea. The pain is worse with ambulation and palpation over the abdomen. She states that the pain radiates into her lower back on both sides. She does have some dysuria and frequency. She has not been exposed to any STDs that she is aware of.   Past Medical History  Diagnosis Date  . Kidney stones     There are no active problems to display for this patient.   Past Surgical History  Procedure Laterality Date  . Appendectomy      No current outpatient prescriptions on file.  Allergies Ciprofloxacin  No family history on file.  Social History History  Substance Use Topics  . Smoking status: Never Smoker   . Smokeless tobacco: Not on file  . Alcohol Use: Yes     Comment: occasionaly    Review of Systems Constitutional: No fever/chills Cardiovascular: Denies chest pain. Respiratory: Denies shortness of breath or cough. Gastrointestinal: Abdominal pain yes., nausea no, vomitingno. Genitourinary: Dysuria yes, vaginal discharge yes.. Musculoskeletal: Negative for back pain. Skin: Negative for rash. Neurological: Negative for headaches, focal weakness or numbness.  10-point ROS otherwise negative.  ____________________________________________   PHYSICAL EXAM:  VITAL SIGNS: ED Triage Vitals  Enc Vitals Group     BP 01/29/15 2123 130/81 mmHg     Pulse Rate 01/29/15 2123 100     Resp 01/29/15 2123 18     Temp 01/29/15  2123 98.6 F (37 C)     Temp Source 01/29/15 2123 Oral     SpO2 01/29/15 2123 100 %     Weight 01/29/15 2123 210 lb (95.255 kg)     Height 01/29/15 2123 5\' 5"  (1.651 m)     Head Cir --      Peak Flow --      Pain Score 01/29/15 2123 8     Pain Loc --      Pain Edu? --      Excl. in GC? --     Constitutional: Alert and oriented. Well appearing and in no acute distress. Eyes: Conjunctivae are normal. PERRL. EOMI. Head: Atraumatic. Nose: No congestion/rhinnorhea. Mouth/Throat: Mucous membranes are moist.  Oropharynx non-erythematous. Neck: No stridor. Cardiovascular: Good peripheral circulation. Respiratory: Normal respiratory effort.  No retractions. Gastrointestinal: Soft and nontender. No distention. No abdominal bruits. Genitourinary: Pelvic exam: Normal external exam. White, thin discharge present in the vaginal vault and around the cervix. Cervix is beefy red in color. Cervix with brownish discharge at cervical os. Cervical motion tenderness is present. Bilateral adnexal tenderness also present. Musculoskeletal: No extremity tenderness nor edema.  Neurologic:  Normal speech and language. No gross focal neurologic deficits are appreciated. Speech is normal. No gait instability. Skin:  Skin is warm, dry and intact. No rash noted. Psychiatric: Mood and affect are normal. Speech and behavior are normal.  ____________________________________________   LABS (all labs ordered are listed, but only abnormal results are displayed)  Labs Reviewed  WET PREP, GENITAL - Abnormal; Notable for the following:    Clue Cells  Wet Prep HPF POC MODERATE (*)    WBC, Wet Prep HPF POC MODERATE (*)    All other components within normal limits  CBC WITH DIFFERENTIAL/PLATELET - Abnormal; Notable for the following:    WBC 14.0 (*)    Neutro Abs 9.2 (*)    Lymphs Abs 3.9 (*)    All other components within normal limits  COMPREHENSIVE METABOLIC PANEL - Abnormal; Notable for the following:    Total  Protein 8.2 (*)    All other components within normal limits  URINALYSIS COMPLETEWITH MICROSCOPIC ([facility] ONLY) - Abnormal; Notable for the following:    Color, Urine YELLOW (*)    APPearance CLEAR (*)    Squamous Epithelial / LPF 0-5 (*)    All other components within normal limits  CHLAMYDIA/NGC RT PCR ([facility] ONLY)  LIPASE, BLOOD  PREGNANCY, URINE   ____________________________________________  RADIOLOGY  Not indicated ____________________________________________   PROCEDURES  Procedure(s) performed: Pelvic exam complete-see assessment.  ____________________________________________   INITIAL IMPRESSION / ASSESSMENT AND PLAN / ED COURSE  Pertinent labs & imaging results that were available during my care of the patient were reviewed by me and considered in my medical decision making (see chart for details).  Patient was given IM Rocephin and by mouth azithromycin in the emergency department. She will receive a prescription for Flagyl. She was advised to avoid drinking any alcohol while taking the medication. She was advised to follow-up with gynecology or the health department for symptoms that are not improving over the week. She was advised to return to the emergency department for symptoms that change or worsen if she is unable schedule an appointment. ____________________________________________   FINAL CLINICAL IMPRESSION(S) / ED DIAGNOSES  Final diagnoses:  Bacterial vaginosis  Pelvic inflammatory disease       Chinita Pester, FNP 01/29/15 [id number]  Maurilio Lovely, MD 01/30/15 0025

## 2015-01-30 LAB — CHLAMYDIA/NGC RT PCR (ARMC ONLY)
Chlamydia Tr: NOT DETECTED
N gonorrhoeae: NOT DETECTED

## 2015-02-16 ENCOUNTER — Emergency Department
Admission: EM | Admit: 2015-02-16 | Discharge: 2015-02-16 | Disposition: A | Discharge status: Home / Self Care | Payer: Medicaid Other | Attending: Emergency Medicine | Admitting: Emergency Medicine

## 2015-02-16 ENCOUNTER — Emergency Department: Payer: Medicaid Other

## 2015-02-16 DIAGNOSIS — F419 Anxiety disorder, unspecified: Secondary | ICD-10-CM | POA: Insufficient documentation

## 2015-02-16 DIAGNOSIS — Z79899 Other long term (current) drug therapy: Secondary | ICD-10-CM | POA: Insufficient documentation

## 2015-02-16 DIAGNOSIS — Z3202 Encounter for pregnancy test, result negative: Secondary | ICD-10-CM | POA: Diagnosis not present

## 2015-02-16 DIAGNOSIS — R109 Unspecified abdominal pain: Secondary | ICD-10-CM

## 2015-02-16 DIAGNOSIS — R1011 Right upper quadrant pain: Secondary | ICD-10-CM | POA: Insufficient documentation

## 2015-02-16 LAB — LIPASE, BLOOD: Lipase: 22 U/L (ref 22–51)

## 2015-02-16 LAB — COMPREHENSIVE METABOLIC PANEL
ALBUMIN: 3.7 g/dL (ref 3.5–5.0)
ALK PHOS: 79 U/L (ref 38–126)
ALT: 27 U/L (ref 14–54)
ANION GAP: 7 (ref 5–15)
AST: 29 U/L (ref 15–41)
BILIRUBIN TOTAL: 0.5 mg/dL (ref 0.3–1.2)
BUN: 12 mg/dL (ref 6–20)
CHLORIDE: 105 mmol/L (ref 101–111)
CO2: 26 mmol/L (ref 22–32)
Calcium: 8.6 mg/dL — ABNORMAL LOW (ref 8.9–10.3)
Creatinine, Ser: 0.5 mg/dL (ref 0.44–1.00)
GFR calc Af Amer: >60 mL/min (ref >60)
GFR calc non Af Amer: >60 mL/min (ref >60)
Glucose, Bld: 108 mg/dL — ABNORMAL HIGH (ref 65–99)
Potassium: 3.7 mmol/L (ref 3.5–5.1)
Sodium: 138 mmol/L (ref 135–145)
TOTAL PROTEIN: 7.7 g/dL (ref 6.5–8.1)

## 2015-02-16 LAB — URINALYSIS COMPLETE WITH MICROSCOPIC (ARMC ONLY)
BACTERIA UA: NONE SEEN
BILIRUBIN URINE: NEGATIVE
Glucose, UA: NEGATIVE mg/dL
KETONES UR: NEGATIVE mg/dL
LEUKOCYTES UA: NEGATIVE
NITRITE: NEGATIVE
Protein, ur: NEGATIVE mg/dL
Specific Gravity, Urine: 1.02 (ref 1.005–1.030)
pH: 6 (ref 5.0–8.0)

## 2015-02-16 LAB — CBC
HEMATOCRIT: 37 % (ref 35.0–47.0)
Hemoglobin: 12 g/dL (ref 12.0–16.0)
MCH: 28.2 pg (ref 26.0–34.0)
MCHC: 32.5 g/dL (ref 32.0–36.0)
MCV: 86.5 fL (ref 80.0–100.0)
Platelets: 267 10*3/uL (ref 150–440)
RBC: 4.27 MIL/uL (ref 3.80–5.20)
RDW: 14 % (ref 11.5–14.5)
WBC: 14.6 10*3/uL — AB (ref 3.6–11.0)

## 2015-02-16 LAB — POCT PREGNANCY, URINE: Preg Test, Ur: NEGATIVE

## 2015-02-16 MED ORDER — ACETAMINOPHEN 500 MG PO TABS
ORAL_TABLET | ORAL | Status: AC
  Administered 2015-02-16: 1000 mg via ORAL
  Filled 2015-02-16: qty 2

## 2015-02-16 MED ORDER — MORPHINE SULFATE 4 MG/ML IJ SOLN
4.0000 mg | Freq: Once | INTRAMUSCULAR | Status: AC
  Administered 2015-02-16: 4 mg via INTRAVENOUS
  Filled 2015-02-16: qty 1

## 2015-02-16 MED ORDER — ONDANSETRON HCL 4 MG/2ML IJ SOLN
4.0000 mg | Freq: Once | INTRAMUSCULAR | Status: AC
  Administered 2015-02-16: 4 mg via INTRAVENOUS
  Filled 2015-02-16: qty 2

## 2015-02-16 MED ORDER — ACETAMINOPHEN 500 MG PO TABS
1000.0000 mg | ORAL_TABLET | Freq: Once | ORAL | Status: AC
  Administered 2015-02-16: 1000 mg via ORAL
  Filled 2015-02-16: qty 2

## 2015-02-16 MED ORDER — TRAMADOL HCL 50 MG PO TABS: 50.0000 mg | ORAL_TABLET | Freq: Four times a day (QID) | ORAL | Status: AC | PRN

## 2015-02-16 MED ORDER — ONDANSETRON HCL 4 MG PO TABS: 4.0000 mg | ORAL_TABLET | Freq: Every day | ORAL | Status: AC | PRN

## 2015-02-16 MED ORDER — ONDANSETRON 4 MG PO TBDP: 4.0000 mg | ORAL_TABLET | Freq: Once | ORAL | Status: DC | PRN

## 2015-02-16 MED ORDER — SODIUM CHLORIDE 0.9 % IV SOLN
1000.0000 mL | Freq: Once | INTRAVENOUS | Status: AC
  Administered 2015-02-16: 1000 mL via INTRAVENOUS

## 2015-02-16 NOTE — ED Notes (Signed)
Patient reports severe abd pain and nausea that began at 8pm.

## 2015-02-16 NOTE — ED Provider Notes (Signed)
[facility] Emergency Department Provider Note  ____________________________________________  Time seen: 7:20 AM  I have reviewed the triage vital signs and the nursing notes.   HISTORY  Chief Complaint Abdominal Pain    HPI Carla Gutierrez is a 24 y.o. female presents with abdominal pain that started last night at approximately 8 PM. She reports the pain is sharp and tearing in sensation To her back. She says the pain is in the umbilical area. She says this pain is different from when she was here 2 weeks ago. She denies dysuria. 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 She does not she's had an appendectomy. No fevers no chills. Severe nausea but no vomiting. Normal stools.     Past Medical History  Diagnosis Date  . Kidney stones     There are no active problems to display for this patient.   Past Surgical History  Procedure Laterality Date  . Appendectomy      Current Outpatient Rx  Name  Route  Sig  Dispense  Refill  . acetaminophen-codeine (TYLENOL #3) 300-30 MG per tablet   Oral   Take 1-2 tablets by mouth every 4 (four) hours as needed for moderate pain. Patient not taking: Reported on 02/16/2015   12 tablet   0   . metroNIDAZOLE (FLAGYL) 500 MG tablet   Oral   Take 1 tablet (500 mg total) by mouth 2 (two) times daily. Patient not taking: Reported on 02/16/2015   14 tablet   0   . ondansetron (ZOFRAN) 4 MG tablet   Oral   Take 1 tablet (4 mg total) by mouth daily as needed for nausea or vomiting.   20 tablet   1   . traMADol (ULTRAM) 50 MG tablet   Oral   Take 1 tablet (50 mg total) by mouth every 6 (six) hours as needed.   20 tablet   0     Allergies Ciprofloxacin  No family history on file.  Social History History  Substance Use Topics  . Smoking status: Never Smoker   . Smokeless tobacco: Not on file  . Alcohol Use: Yes     Comment: occasionaly    Review of Systems  Constitutional: Negative for  fever. Eyes: Negative for visual changes. ENT: Negative for sore throat Cardiovascular: Negative for chest pain. Respiratory: Negative for shortness of breath. Gastrointestinal: Positive for abdominal pain and nausea Genitourinary: Negative for dysuria. Musculoskeletal: Negative for back pain. Skin: Negative for rash. Neurological: Negative for headaches or focal weakness Psychiatric: Mild anxiety  10-point ROS otherwise negative.  ____________________________________________   PHYSICAL EXAM:  VITAL SIGNS: ED Triage Vitals  Enc Vitals Group     BP 02/16/15 0252 121/73 mmHg     Pulse --      Resp 02/16/15 0252 22     Temp 02/16/15 0252 97.8 F (36.6 C)     Temp Source 02/16/15 0252 Oral     SpO2 02/16/15 0252 100 %     Weight 02/16/15 0252 210 lb (95.255 kg)     Height 02/16/15 0252  (1.651 m)     Head Cir --      Peak Flow --      Pain Score 02/16/15 0252 10     Pain Loc --      Pain Edu? --      Excl. in GC? --      Constitutional: Alert and oriented. Well appearing and in no distress. Eyes: Conjunctivae are normal.  ENT   Head: Normocephalic and atraumatic.   Mouth/Throat: Mucous membranes are moist. Cardiovascular: Normal rate, regular rhythm. Normal and symmetric distal pulses are present in all extremities. No murmurs, rubs, or gallops. Respiratory: Normal respiratory effort without tachypnea nor retractions. Breath sounds are clear and equal bilaterally.  Gastrointestinal: Significant tenderness palpation right upper quadrant, otherwise benign exam. No distention. There is no CVA tenderness. Genitourinary: deferred Musculoskeletal: Nontender with normal range of motion in all extremities. No lower extremity tenderness nor edema. Neurologic:  Normal speech and language. No gross focal neurologic deficits are appreciated. Skin:  Skin is warm, dry and intact. No rash noted. Psychiatric: Mood and affect are normal. Patient exhibits appropriate insight  and judgment.  ____________________________________________    LABS (pertinent positives/negatives)  Labs Reviewed  COMPREHENSIVE METABOLIC PANEL - Abnormal; Notable for the following:    Glucose, Bld 108 (*)    Calcium 8.6 (*)    All other components within normal limits  CBC - Abnormal; Notable for the following:    WBC 14.6 (*)    All other components within normal limits  URINALYSIS COMPLETEWITH MICROSCOPIC ([facility] ONLY) - Abnormal; Notable for the following:    Color, Urine YELLOW (*)    APPearance CLEAR (*)    Hgb urine dipstick 1+ (*)    Squamous Epithelial / LPF 0-5 (*)    All other components within normal limits  LIPASE, BLOOD  POCT PREGNANCY, URINE  POC URINE PREG, ED    ____________________________________________   EKG None  ____________________________________________    RADIOLOGY I have personally reviewed any xrays that were ordered on this patient: Ultrasound right upper quadrant unremarkable  ____________________________________________   PROCEDURES  Procedure(s) performed: none  Critical Care performed: none  ____________________________________________   INITIAL IMPRESSION / ASSESSMENT AND PLAN / ED COURSE  Pertinent labs & imaging results that were available during my care of the patient were reviewed by me and considered in my medical decision making (see chart for details).  Patient complains of umbilical pain but is significantly tender in the right upper quadrant. Given rapid onset of abdominal pain and nausea biliary colic is a possibility. Her lipase is normal. Gastritis is another possibility versus peptic ulcer disease. Given that she has a horseshoe kidney kidney stone is also a possibility although less likely. We are waiting for urine   Urine unremarkable and in fact patient's pain has resolved. Her ultrasound is normal she feels well and is ready to go. I think this is reasonable. She will return if her symptoms return or if  she has any concerns  ____________________________________________   FINAL CLINICAL IMPRESSION(S) / ED DIAGNOSES  Final diagnoses:  Abdominal pain     Jene Every, MD 02/16/15 1341

## 2015-02-16 NOTE — ED Notes (Signed)
Patient transported to Ultrasound 

## 2015-02-16 NOTE — ED Notes (Signed)
MD at bedside. 

## 2015-02-16 NOTE — Discharge Instructions (Signed)
Abdominal Pain, Women °Abdominal (stomach, pelvic, or belly) pain can be caused by many things. It is important to tell your doctor: °· The location of the pain. °· Does it come and go or is it present all the time? °· Are there things that start the pain (eating certain foods, exercise)? °· Are there other symptoms associated with the pain (fever, nausea, vomiting, diarrhea)? °All of this is helpful to know when trying to find the cause of the pain. °CAUSES  °· Stomach: virus or bacteria infection, or ulcer. °· Intestine: appendicitis (inflamed appendix), regional ileitis (Crohn's disease), ulcerative colitis (inflamed colon), irritable bowel syndrome, diverticulitis (inflamed diverticulum of the colon), or cancer of the stomach or intestine. °· Gallbladder disease or stones in the gallbladder. °· Kidney disease, kidney stones, or infection. °· Pancreas infection or cancer. °· Fibromyalgia (pain disorder). °· Diseases of the female organs: °¨ Uterus: fibroid (non-cancerous) tumors or infection. °¨ Fallopian tubes: infection or tubal pregnancy. °¨ Ovary: cysts or tumors. °¨ Pelvic adhesions (scar tissue). °¨ Endometriosis (uterus lining tissue growing in the pelvis and on the pelvic organs). °¨ Pelvic congestion syndrome (female organs filling up with blood just before the menstrual period). °¨ Pain with the menstrual period. °¨ Pain with ovulation (producing an egg). °¨ Pain with an IUD (intrauterine device, birth control) in the uterus. °¨ Cancer of the female organs. °· Functional pain (pain not caused by a disease, may improve without treatment). °· Psychological pain. °· Depression. °DIAGNOSIS  °Your doctor will decide the seriousness of your pain by doing an examination. °· Blood tests. °· X-rays. °· Ultrasound. °· CT scan (computed tomography, special type of X-ray). °· MRI (magnetic resonance imaging). °· Cultures, for infection. °· Barium enema (dye inserted in the large intestine, to better view it with  X-rays). °· Colonoscopy (looking in intestine with a lighted tube). °· Laparoscopy (minor surgery, looking in abdomen with a lighted tube). °· Major abdominal exploratory surgery (looking in abdomen with a large incision). °TREATMENT  °The treatment will depend on the cause of the pain.  °· Many cases can be observed and treated at home. °· Over-the-counter medicines recommended by your caregiver. °· Prescription medicine. °· Antibiotics, for infection. °· Birth control pills, for painful periods or for ovulation pain. °· Hormone treatment, for endometriosis. °· Nerve blocking injections. °· Physical therapy. °· Antidepressants. °· Counseling with a psychologist or psychiatrist. °· Minor or major surgery. °HOME CARE INSTRUCTIONS  °· Do not take laxatives, unless directed by your caregiver. °· Take over-the-counter pain medicine only if ordered by your caregiver. Do not take aspirin because it can cause an upset stomach or bleeding. °· Try a clear liquid diet (broth or water) as ordered by your caregiver. Slowly move to a bland diet, as tolerated, if the pain is related to the stomach or intestine. °· Have a thermometer and take your temperature several times a day, and record it. °· Bed rest and sleep, if it helps the pain. °· Avoid sexual intercourse, if it causes pain. °· Avoid stressful situations. °· Keep your follow-up appointments and tests, as your caregiver orders. °· If the pain does not go away with medicine or surgery, you may try: °¨ Acupuncture. °¨ Relaxation exercises (yoga, meditation). °¨ Group therapy. °¨ Counseling. °SEEK MEDICAL CARE IF:  °· You notice certain foods cause stomach pain. °· Your home care treatment is not helping your pain. °· You need stronger pain medicine. °· You want your IUD removed. °· You feel faint or   lightheaded. °· You develop nausea and vomiting. °· You develop a rash. °· You are having side effects or an allergy to your medicine. °SEEK IMMEDIATE MEDICAL CARE IF:  °· Your  pain does not go away or gets worse. °· You have a fever. °· Your pain is felt only in portions of the abdomen. The right side could possibly be appendicitis. The left lower portion of the abdomen could be colitis or diverticulitis. °· You are passing blood in your stools (bright red or black tarry stools, with or without vomiting). °· You have blood in your urine. °· You develop chills, with or without a fever. °· You pass out. °MAKE SURE YOU:  °· Understand these instructions. °· Will watch your condition. °· Will get help right away if you are not doing well or get worse. °Document Released: 05/08/2007 Document Revised: 11/25/2013 Document Reviewed: 05/28/2009 °[employer]® Patient Information ©2015 [employer]. This information is not intended to replace advice given to you by your health care provider. Make sure you discuss any questions you have with your health care provider. ° °

## 2015-02-16 NOTE — ED Notes (Signed)
Pt informed to return if any life threatening symptoms occur.  

## 2015-03-05 DIAGNOSIS — R1011 Right upper quadrant pain: Secondary | ICD-10-CM | POA: Insufficient documentation

## 2015-03-05 DIAGNOSIS — Z3202 Encounter for pregnancy test, result negative: Secondary | ICD-10-CM | POA: Insufficient documentation

## 2015-03-05 LAB — URINALYSIS COMPLETE WITH MICROSCOPIC (ARMC ONLY)
Bilirubin Urine: NEGATIVE
Glucose, UA: NEGATIVE mg/dL
HGB URINE DIPSTICK: NEGATIVE
KETONES UR: NEGATIVE mg/dL
Nitrite: NEGATIVE
Protein, ur: NEGATIVE mg/dL
SPECIFIC GRAVITY, URINE: 1.016 (ref 1.005–1.030)
pH: 6 (ref 5.0–8.0)

## 2015-03-05 LAB — COMPREHENSIVE METABOLIC PANEL
ALT: 20 U/L (ref 14–54)
ANION GAP: 6 (ref 5–15)
AST: 25 U/L (ref 15–41)
Albumin: 3.7 g/dL (ref 3.5–5.0)
Alkaline Phosphatase: 80 U/L (ref 38–126)
BUN: 9 mg/dL (ref 6–20)
CALCIUM: 9.1 mg/dL (ref 8.9–10.3)
CO2: 27 mmol/L (ref 22–32)
CREATININE: 0.62 mg/dL (ref 0.44–1.00)
Chloride: 104 mmol/L (ref 101–111)
GFR calc Af Amer: >60 mL/min (ref >60)
GFR calc non Af Amer: >60 mL/min (ref >60)
GLUCOSE: 84 mg/dL (ref 65–99)
Potassium: 3.7 mmol/L (ref 3.5–5.1)
Sodium: 137 mmol/L (ref 135–145)
TOTAL PROTEIN: 7.9 g/dL (ref 6.5–8.1)
Total Bilirubin: 0.3 mg/dL (ref 0.3–1.2)

## 2015-03-05 LAB — LIPASE, BLOOD: Lipase: 29 U/L (ref 22–51)

## 2015-03-05 NOTE — ED Notes (Signed)
Patient to ED for continued abdominal pain. Saw Dr. Cyril Loosen on the 25th of last month and was told that if she continued to hurt or have fevers to come back. She returns today because she is continuing to hurt. States nausea without vomiting.

## 2015-03-06 ENCOUNTER — Emergency Department: Payer: Medicaid Other

## 2015-03-06 ENCOUNTER — Emergency Department
Admission: EM | Admit: 2015-03-06 | Discharge: 2015-03-06 | Disposition: A | Discharge status: Home / Self Care | Payer: Medicaid Other | Attending: Emergency Medicine | Admitting: Emergency Medicine

## 2015-03-06 DIAGNOSIS — R1011 Right upper quadrant pain: Secondary | ICD-10-CM

## 2015-03-06 HISTORY — DX: Unspecified asthma, uncomplicated: J45.909

## 2015-03-06 MED ORDER — ONDANSETRON HCL 4 MG/2ML IJ SOLN
4.0000 mg | Freq: Once | INTRAMUSCULAR | Status: AC
  Administered 2015-03-06: 4 mg via INTRAVENOUS
  Filled 2015-03-06: qty 2

## 2015-03-06 MED ORDER — OXYCODONE-ACETAMINOPHEN 5-325 MG PO TABS: 1.0000 | ORAL_TABLET | ORAL | Status: AC | PRN

## 2015-03-06 MED ORDER — IOHEXOL 240 MG/ML SOLN
25.0000 mL | Freq: Once | INTRAMUSCULAR | Status: AC | PRN
  Administered 2015-03-06: 25 mL via ORAL

## 2015-03-06 MED ORDER — MORPHINE SULFATE 2 MG/ML IJ SOLN
2.0000 mg | Freq: Once | INTRAMUSCULAR | Status: AC
  Administered 2015-03-06: 2 mg via INTRAVENOUS
  Filled 2015-03-06: qty 1

## 2015-03-06 MED ORDER — IOHEXOL 300 MG/ML  SOLN
100.0000 mL | Freq: Once | INTRAMUSCULAR | Status: AC | PRN
Start: 2015-03-06 — End: 2015-03-06
  Administered 2015-03-06: 125 mL via INTRAVENOUS

## 2015-03-06 NOTE — ED Notes (Signed)
POC Urine preg resulted= NEGATIVE 

## 2015-03-06 NOTE — ED Provider Notes (Signed)
[facility] Emergency Department Provider Note  ____________________________________________  Time seen: 1:15 AM  I have reviewed the triage vital signs and the nursing notes.   HISTORY  Chief Complaint Abdominal Pain     HPI Carla Gutierrez is a 24 y.o. female presents with worsening abdominal pain has been going on in total 1-1/2 month. Patient states the pain is located in her right upper quadrant worse or eating fatty foods. Patient states the pain radiates to her right shoulder blade. Patient also admits to intermittent fevers.      Past Medical History  Diagnosis Date  . Kidney stones     There are no active problems to display for this patient.   Past Surgical History  Procedure Laterality Date  . Bunionectomy Left   . Appendectomy      Current Outpatient Rx  Name  Route  Sig  Dispense  Refill  . acetaminophen-codeine (TYLENOL #3) 300-30 MG per tablet   Oral   Take 1-2 tablets by mouth every 4 (four) hours as needed for moderate pain. Patient not taking: Reported on 02/16/2015   12 tablet   0   . metroNIDAZOLE (FLAGYL) 500 MG tablet   Oral   Take 1 tablet (500 mg total) by mouth 2 (two) times daily. Patient not taking: Reported on 02/16/2015   14 tablet   0   . ondansetron (ZOFRAN) 4 MG tablet   Oral   Take 1 tablet (4 mg total) by mouth daily as needed for nausea or vomiting.   20 tablet   1   . traMADol (ULTRAM) 50 MG tablet   Oral   Take 1 tablet (50 mg total) by mouth every 6 (six) hours as needed.   20 tablet   0     Allergies Ciprofloxacin  No family history on file.  Social History Social History  Substance Use Topics  . Smoking status: Never Smoker   . Smokeless tobacco: None  . Alcohol Use: Yes     Comment: occasionaly    Review of Systems  Constitutional: Negative for fever. Eyes: Negative for visual changes. ENT: Negative for sore throat. Cardiovascular: Negative for chest pain. Respiratory:  Negative for shortness of breath. Gastrointestinal: Negative for abdominal pain, vomiting and diarrhea. Genitourinary: Negative for dysuria. Musculoskeletal: Negative for back pain. Skin: Negative for rash. Neurological: Negative for headaches, focal weakness or numbness.   10-point ROS otherwise negative.  ____________________________________________   PHYSICAL EXAM:  VITAL SIGNS: ED Triage Vitals  Enc Vitals Group     BP 03/05/15 2152 108/79 mmHg     Pulse Rate 03/05/15 2152 98     Resp --      Temp 03/05/15 2152 98.6 F (37 C)     Temp Source 03/05/15 2152 Oral     SpO2 03/05/15 2151 99 %     Weight 03/05/15 2152 210 lb (95.255 kg)     Height 03/05/15 2152 5\' 5"  (1.651 m)     Head Cir --      Peak Flow --      Pain Score 03/05/15 2154 8     Pain Loc --      Pain Edu? --      Excl. in GC? --      Constitutional: Alert and oriented. Well appearing and in no distress. Eyes: Conjunctivae are normal. PERRL. Normal extraocular movements. ENT   Head: Normocephalic and atraumatic.   Nose: No congestion/rhinnorhea.   Mouth/Throat: Mucous membranes are moist.  Neck: No stridor. Cardiovascular: Normal rate, regular rhythm. Normal and symmetric distal pulses are present in all extremities. No murmurs, rubs, or gallops. Respiratory: Normal respiratory effort without tachypnea nor retractions. Breath sounds are clear and equal bilaterally. No wheezes/rales/rhonchi. Gastrointestinal: Soft and nontender. No distention. There is no CVA tenderness. Genitourinary: deferred Musculoskeletal: Nontender with normal range of motion in all extremities. No joint effusions.  No lower extremity tenderness nor edema. Neurologic:  Normal speech and language. No gross focal neurologic deficits are appreciated. Speech is normal.  Skin:  Skin is warm, dry and intact. No rash noted. Psychiatric: Mood and affect are normal. Speech and behavior are normal. Patient exhibits appropriate  insight and judgment.  ____________________________________________    LABS (pertinent positives/negatives)  Labs Reviewed  URINALYSIS COMPLETEWITH MICROSCOPIC ([facility] ONLY) - Abnormal; Notable for the following:    Color, Urine YELLOW (*)    APPearance HAZY (*)    Leukocytes, UA TRACE (*)    Bacteria, UA RARE (*)    Squamous Epithelial / LPF 0-5 (*)    All other components within normal limits  LIPASE, BLOOD  COMPREHENSIVE METABOLIC PANEL  POC URINE PREG, ED    RADIOLOGY  CT Abdomen Pelvis W Contrast (Final result) Result time: 03/06/15 03:15:02   Final result by Rad Results In Interface (03/06/15 03:15:02)   Narrative:   CLINICAL DATA: Acute onset of generalized abdominal pain. Nausea. Initial encounter.  EXAM: CT ABDOMEN AND PELVIS WITH CONTRAST  TECHNIQUE: Multidetector CT imaging of the abdomen and pelvis was performed using the standard protocol following bolus administration of intravenous contrast.  CONTRAST: OMNIPAQUE IOHEXOL 300 MG/ML SOLN  COMPARISON: Right upper quadrant ultrasound performed 05/19/2015  FINDINGS: The visualized lung bases are clear.  The liver and spleen are unremarkable in appearance. The gallbladder is within normal limits. The pancreas and adrenal glands are unremarkable.  A horseshoe kidney is noted. The kidney is unremarkable in appearance. There is no evidence of hydronephrosis. No renal or ureteral stones are seen. No perinephric stranding is unchanged.  No free fluid is identified. The small bowel is unremarkable in appearance. The stomach is within normal limits. No acute vascular abnormalities are seen.  The patient is status post appendectomy. The colon is unremarkable in appearance.  The bladder is mildly distended and grossly unremarkable. The uterus is unremarkable in appearance. The ovaries are grossly symmetric. No suspicious adnexal masses are seen. No inguinal lymphadenopathy is seen.  No acute  osseous abnormalities are identified.  IMPRESSION: 1. No acute abnormality seen within the abdomen or pelvis. 2. Horseshoe kidney is unremarkable in appearance.   Electronically Signed By: Roanna Raider M.D. On: 03/06/2015 03:15          INITIAL IMPRESSION / ASSESSMENT AND PLAN / ED COURSE  Pertinent labs & imaging results that were available during my care of the patient were reviewed by me and considered in my medical decision making (see chart for details).  CT scan of the abdomen and pelvis revealed no acute abnormality. History of physical exam concerning for possible gallbladder disease as such patient will be referred to gastroenterology for outpatient HIDA scan.   ____________________________________________   FINAL CLINICAL IMPRESSION(S) / ED DIAGNOSES  Final diagnoses:  Right upper quadrant pain      Darci Current, MD 03/06/15 [phone]

## 2015-03-06 NOTE — Discharge Instructions (Signed)

## 2015-03-20 LAB — POCT PREGNANCY, URINE: Preg Test, Ur: NEGATIVE

## 2015-06-03 ENCOUNTER — Other Ambulatory Visit: Payer: Self-pay | Admitting: Family Medicine

## 2015-06-03 LAB — CBC AND DIFFERENTIAL
Baso # K/uL: 0 10*3/uL (ref 0.0–0.1)
Basophil %: 0.5 %^% (ref 0.1–1.2)
Eos # K/uL: 0.1 10*3/uL (ref 0.0–0.4)
Eosinophil %: 2.2 %^% (ref 0.7–5.8)
Hematocrit: 35.7 %^% (ref 34.1–44.9)
Hemoglobin: 11.9 g/dL^g/dL (ref 11.2–15.7)
Immature Granulocytes Absolute: 0.02 10*3/uL (ref 0.0–0.2)
Immature Granulocytes: 0.3 %^% (ref 0.0–2.0)
Lymph # K/uL: 2 10*3/uL (ref 1.2–3.7)
Lymphocyte %: 30.6 %^% (ref 19.3–51.7)
MCH: 28.5 pg^pg (ref 25.6–32.2)
MCHC: 33.3 g/dL^g/dL (ref 32.2–35.5)
MCV: 85.4 fL^fL (ref 79.4–94.8)
Mono # K/uL: 0.5 10*3/uL (ref 0.2–0.9)
Monocyte %: 7.3 %^% (ref 4.7–12.5)
Neut # K/uL: 3.8 10*3/uL (ref 1.6–6.0)
Nucl RBC %: 0 /100 WBC^/100 WBC (ref 0.0–0.2)
Platelets: 295 10*3/uL (ref 182–369)
RBC Distribution Width-SD: 41.4 fL^fL (ref 36.4–46.3)
RBC: 4.18 10*6/uL (ref 3.93–5.22)
RDW: 13.3 %^% (ref 11.7–14.4)
Seg Neut %: 59.1 %^% (ref 34.0–71.1)
WBC: 6.5 10*3/uL (ref 4.0–10.0)

## 2015-06-03 LAB — RHEUMATOID FACTOR,SCREEN: RF Quant: <10 IU/mL^IU/mL (ref 0–15)

## 2015-06-03 LAB — SEDIMENTATION RATE, AUTOMATED: Sedimentation Rate: 40 mm/hr^mm/hr — ABNORMAL HIGH (ref 0–20)

## 2015-06-03 LAB — T4, FREE: Free T4: 1.38 ng/dL^ng/dL (ref 0.76–1.46)

## 2015-06-03 LAB — CRP: CRP: 6.6 mg/L^mg/L (ref 0.0–9.0)

## 2015-06-03 LAB — TSH: TSH: 0.43 uIU/mL^uIU/mL (ref 0.36–3.74)

## 2015-06-05 LAB — ANTINUCLEAR ANTIBODY SCREEN: ANA Screen: NEGATIVE

## 2015-09-03 ENCOUNTER — Other Ambulatory Visit: Payer: Self-pay

## 2015-09-04 LAB — VARICELLA ZOSTER IGG AB: VZV IgG: POSITIVE

## 2015-09-04 LAB — HEPATITIS B SURFACE ANTIGEN: HBV S AG: NONREACTIVE

## 2015-09-04 LAB — HEPATITIS C ANTIBODY: Hepatitis C Ab: <0.02 s/co ratio^s/co ratio (ref 0.00–0.79)

## 2015-09-04 LAB — HEPATITIS B SURFACE ANTIBODY: Hep B S Ab: 18.82 mIU/mL^mIU/mL

## 2015-09-05 LAB — VAGINITIS SCREEN: DNA PROBE

## 2015-09-07 LAB — HSV 2 ANTIBODY, IGG: HSV 2 IgG: 0.22 IV^IV

## 2015-09-08 LAB — HSV 1 ANTIBODY, IGG: HSV 1 IgG: 0.34 IV^IV

## 2015-09-09 LAB — GYN CYTOLOGY

## 2015-09-15 LAB — HIV 1&2 ANTIGEN/ANTIBODY: HIV 1&2 ANTIGEN/ANTIBODY: NONREACTIVE

## 2016-02-02 ENCOUNTER — Other Ambulatory Visit: Payer: Self-pay | Admitting: Obstetrics & Gynecology

## 2016-02-02 ENCOUNTER — Other Ambulatory Visit: Payer: Self-pay | Admitting: Obstetrics and Gynecology

## 2016-02-03 LAB — VAGINITIS SCREEN: DNA PROBE

## 2016-02-05 LAB — HSV 2 ANTIBODY, IGG: HSV 2 IgG: 0.12 IV

## 2016-02-05 LAB — HSV 1 ANTIBODY, IGG: HSV 1 IgG: 0.34 IV

## 2016-02-14 IMAGING — CT CT ABD-PELV W/ CM
1 of 2 series · 15 of 32 positions shown, 19 images · IV contrast (omnipaque)
Comparison: Right upper quadrant ultrasound performed 05/19/2015

CLINICAL DATA: Acute onset of generalized abdominal pain. Nausea.
Initial encounter.

EXAM:
CT ABDOMEN AND PELVIS WITH CONTRAST
TECHNIQUE: Multidetector CT imaging of the abdomen and pelvis was performed
using the standard protocol following bolus administration of
intravenous contrast.
CONTRAST:  125mL OMNIPAQUE IOHEXOL 300 MG/ML  SOLN

[Series 2: routine abd pel with · axial · 0.87mm/px · z∈[-594,-118]mm · 15 of 105 slices shown, 19 images]
[im 5/105  soft-tissue]
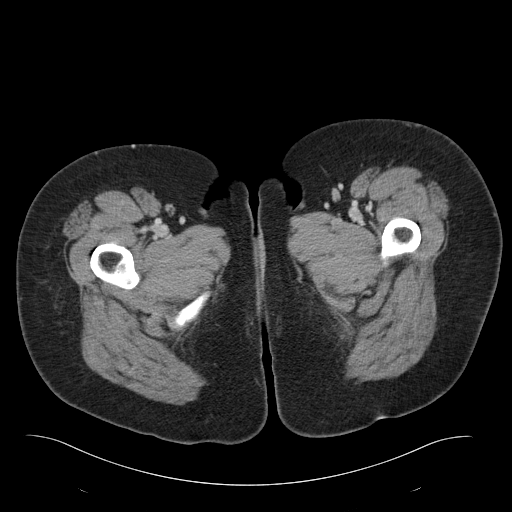
[im 5/105  bone]
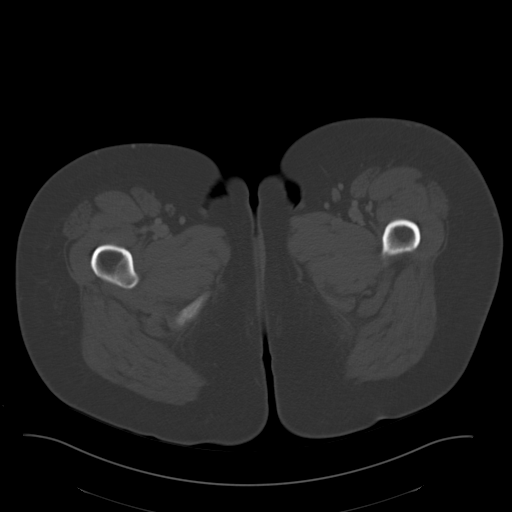
[im 14/105  soft-tissue]
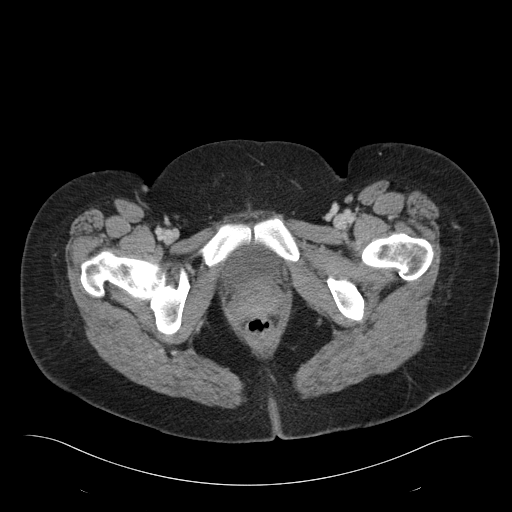
[im 22/105  soft-tissue]
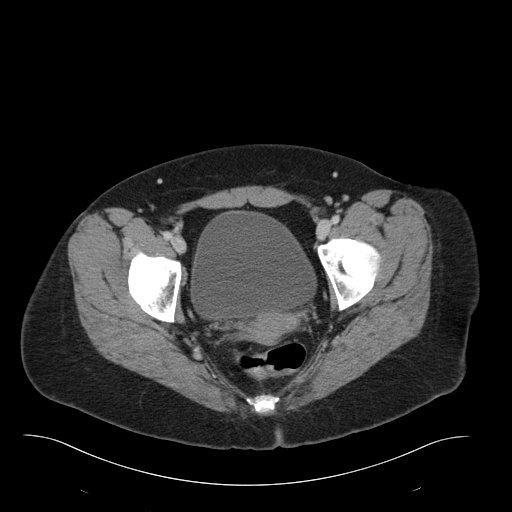
[im 31/105  soft-tissue]
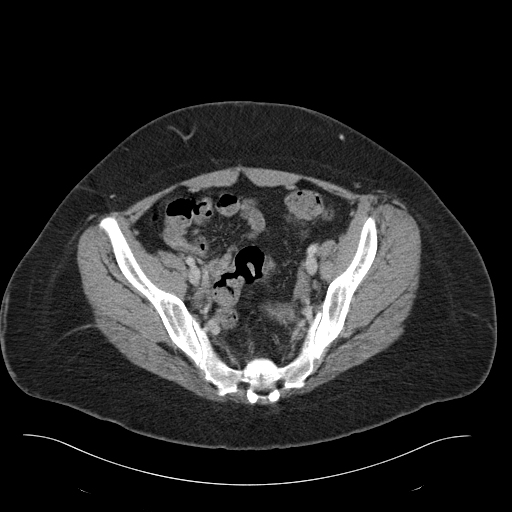
[im 35/105  soft-tissue]
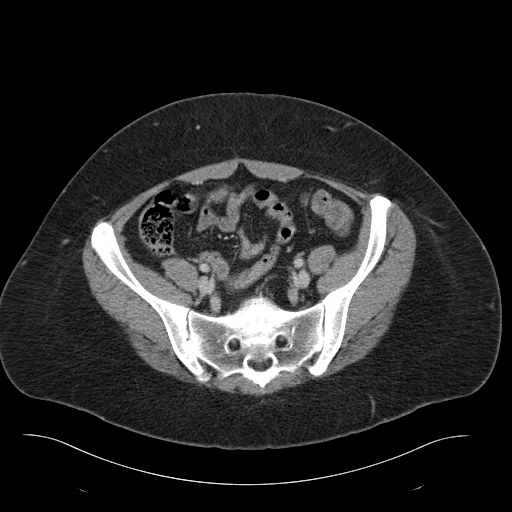
[im 44/105  soft-tissue]
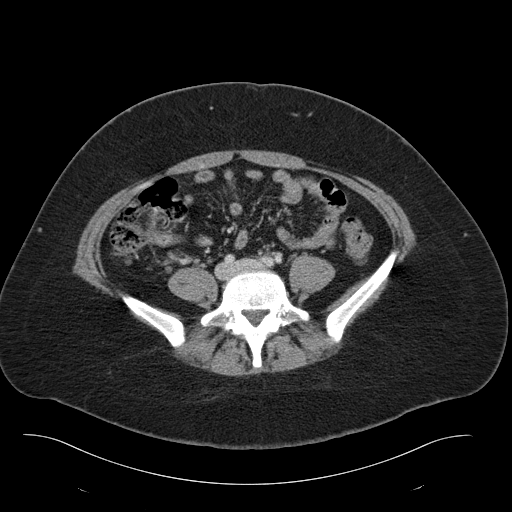
[im 53/105  soft-tissue]
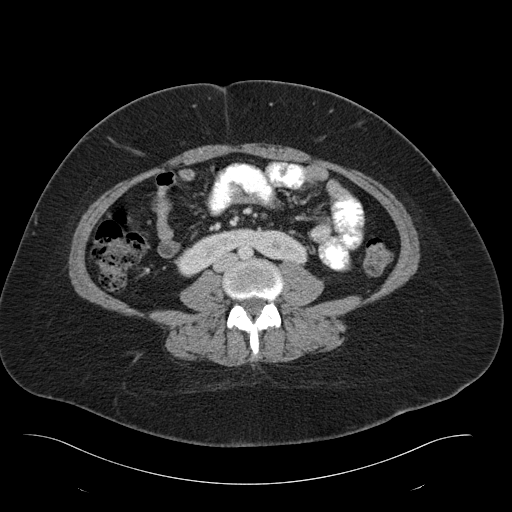
[im 61/105  soft-tissue]
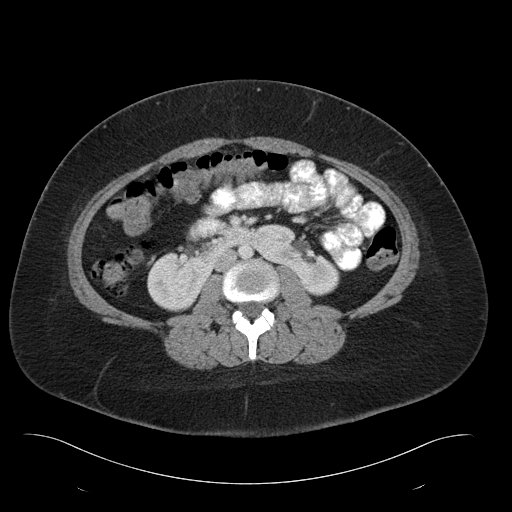
[im 70/105  soft-tissue]
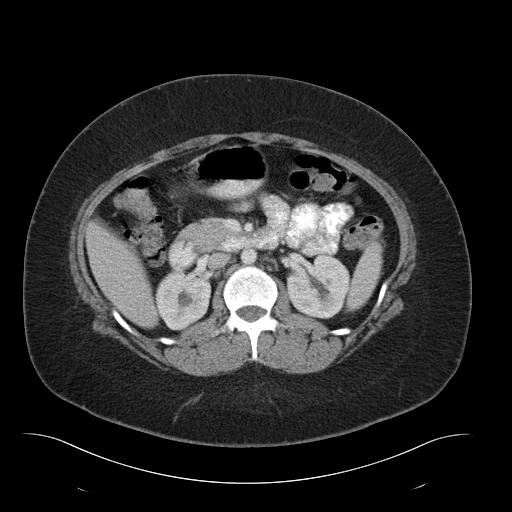
[im 70/105  bone]
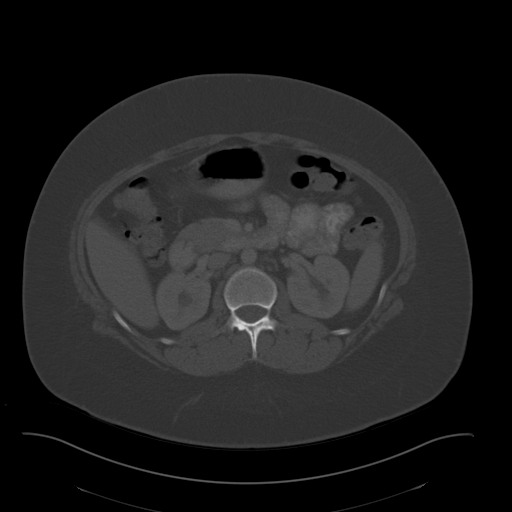
[im 74/105  soft-tissue]
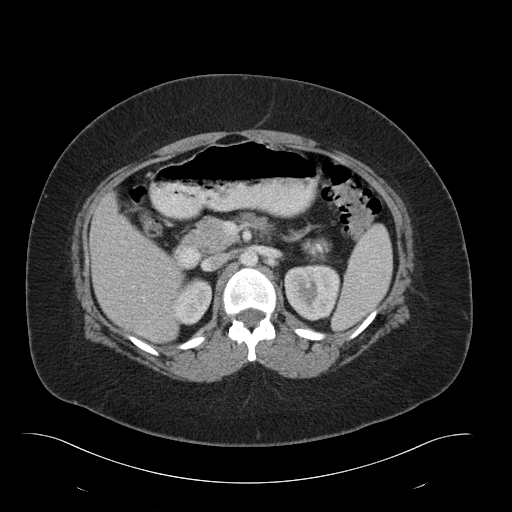
[im 83/105  soft-tissue]
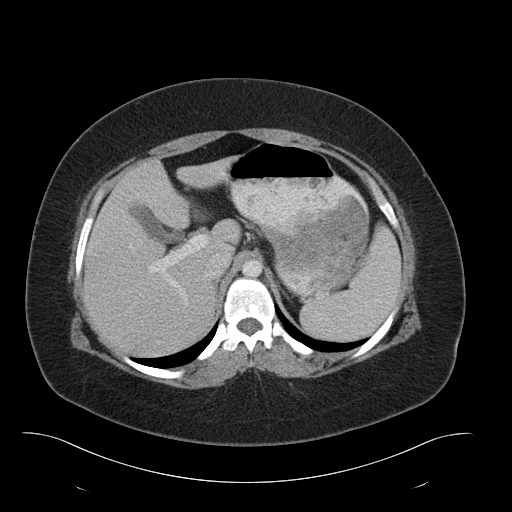
[im 87/105  lung]
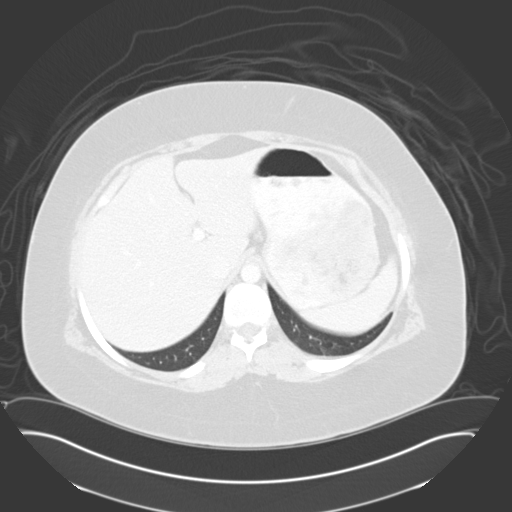
[im 92/105  soft-tissue]
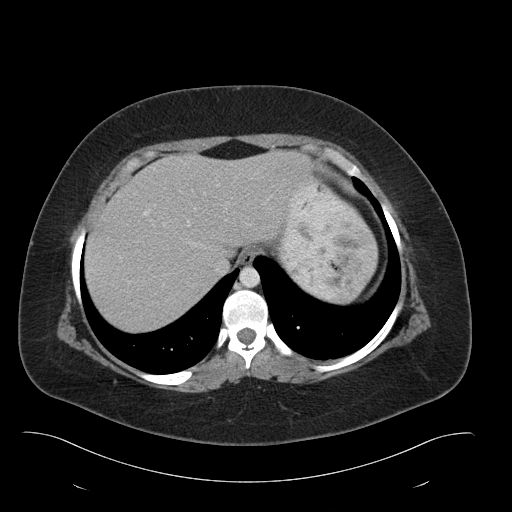
[im 92/105  lung]
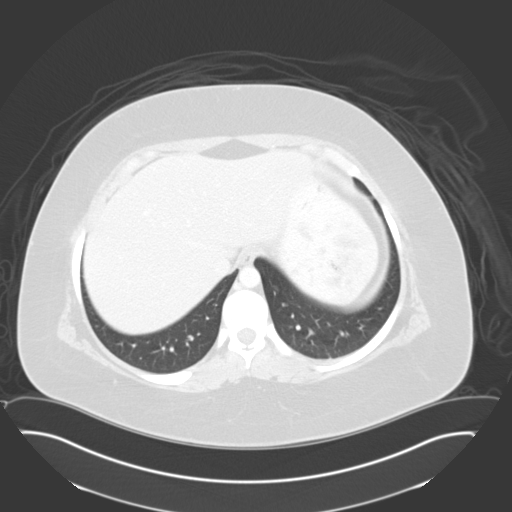
[im 96/105  lung]
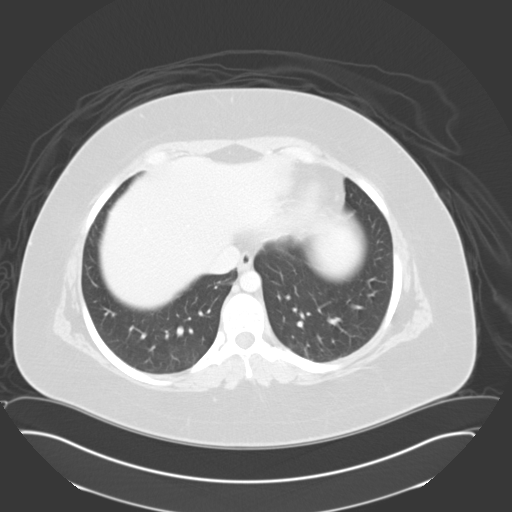
[im 100/105  soft-tissue]
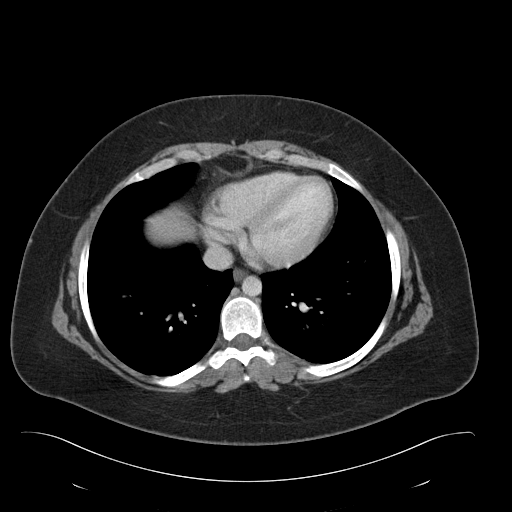
[im 100/105  lung]
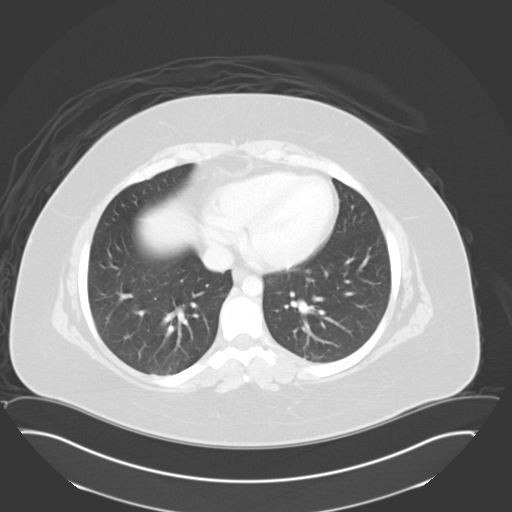

[15 of 32 positions shown; findings below may reference images not displayed]

FINDINGS: The visualized lung bases are clear.

The liver and spleen are unremarkable in appearance. The gallbladder
is within normal limits. The pancreas and adrenal glands are
unremarkable.

A horseshoe kidney is noted. The kidney is unremarkable in
appearance. There is no evidence of hydronephrosis. No renal or
ureteral stones are seen. No perinephric stranding is unchanged.

No free fluid is identified. The small bowel is unremarkable in
appearance. The stomach is within normal limits. No acute vascular
abnormalities are seen.

The patient is status post appendectomy. The colon is unremarkable
in appearance.

The bladder is mildly distended and grossly unremarkable. The uterus
is unremarkable in appearance. The ovaries are grossly symmetric. No
suspicious adnexal masses are seen. No inguinal lymphadenopathy is
seen.

No acute osseous abnormalities are identified.
IMPRESSION: 1. No acute abnormality seen within the abdomen or pelvis.
2. Horseshoe kidney is unremarkable in appearance.

## 2016-02-15 ENCOUNTER — Other Ambulatory Visit: Payer: Self-pay

## 2016-02-15 LAB — TSH: TSH: 0.91 u[IU]/mL (ref 0.27–4.20)

## 2016-02-16 DIAGNOSIS — F4310 Post-traumatic stress disorder, unspecified: Secondary | ICD-10-CM | POA: Insufficient documentation

## 2016-02-16 DIAGNOSIS — Z62810 Personal history of physical and sexual abuse in childhood: Secondary | ICD-10-CM | POA: Insufficient documentation

## 2016-02-24 DIAGNOSIS — F410 Panic disorder [episodic paroxysmal anxiety] without agoraphobia: Secondary | ICD-10-CM | POA: Insufficient documentation

## 2016-04-04 ENCOUNTER — Encounter: Payer: Self-pay | Admitting: Gastroenterology

## 2016-04-05 ENCOUNTER — Telehealth: Payer: Self-pay | Admitting: Neurology

## 2016-04-05 NOTE — Telephone Encounter (Signed)
Calling to schedule follow up appointment with patient to see Dr. Florian BuffZyloney and received updated office notes. Patient is schedule.

## 2016-04-19 ENCOUNTER — Other Ambulatory Visit: Payer: Self-pay | Admitting: Urgent Care

## 2016-04-19 ENCOUNTER — Other Ambulatory Visit: Payer: Self-pay | Admitting: Neurosurgery

## 2016-04-19 LAB — CBC AND DIFFERENTIAL
Baso # K/uL: 0 10*3/uL (ref 0.0–0.1)
Basophil %: 0.2 % (ref 0.1–1.2)
Eos # K/uL: 0.1 10*3/uL (ref 0.0–0.4)
Eosinophil %: 0.9 % (ref 0.7–5.8)
Hematocrit: 38.1 % (ref 34.1–44.9)
Hemoglobin: 12.9 g/dL (ref 11.2–15.7)
Immature Granulocytes Absolute: 0.03 10*3/uL (ref 0.0–0.2)
Immature Granulocytes: 0.3 % (ref 0.0–2.0)
Lymph # K/uL: 3.1 10*3/uL (ref 1.2–3.7)
Lymphocyte %: 31.3 % (ref 19.3–51.7)
MCH: 28.7 pg (ref 25.6–32.2)
MCHC: 33.9 g/dL (ref 32.2–35.5)
MCV: 84.7 fL (ref 79.4–94.8)
Mono # K/uL: 0.5 10*3/uL (ref 0.2–0.9)
Monocyte %: 5.1 % (ref 4.7–12.5)
Neut # K/uL: 6.1 10*3/uL — ABNORMAL HIGH (ref 1.6–6.0)
Nucl RBC # K/uL: 0 /100 WBC (ref 0.0–0.2)
Platelets: 311 10*3/uL (ref 182–369)
RBC Distribution Width-SD: 41.3 fL (ref 36.4–46.3)
RBC: 4.5 10*6/uL (ref 3.93–5.22)
RDW: 13.3 % (ref 11.7–14.4)
Seg Neut %: 62.2 % (ref 34.0–71.1)
WBC: 9.8 10*3/uL (ref 4.0–10.0)

## 2016-04-19 LAB — COMPREHENSIVE METABOLIC PANEL
ALT: 11 U/L (ref 0–35)
AST: 21 U/L (ref 0–35)
Albumin: 4.3 g/dL (ref 3.5–5.2)
Alk Phos: 95 U/L (ref 35–105)
Anion Gap: 15 ^^L (ref 7–16)
Bilirubin,Total: 0.3 mg/dL (ref 0.0–1.2)
CO2: 24 mmol/L (ref 20–28)
Calcium: 9.5 mg/dL (ref 9.0–10.4)
Chloride: 98 mmol/L (ref 96–108)
Creatinine: 0.48 mg/dL — ABNORMAL LOW (ref 0.51–0.95)
GFR,Black: 192 mL/min/{1.73_m2}
GFR,Caucasian: 159 mL/min/{1.73_m2}
Glucose: 83 mg/dL (ref 60–99)
Lab: 8 mg/dL (ref 6–20)
Potassium: 3.9 mmol/L (ref 3.4–4.7)
Sodium: 137 mmol/L (ref 133–145)
Total Protein: 8.7 g/dL — ABNORMAL HIGH (ref 6.3–7.7)

## 2016-04-19 LAB — URINALYSIS WITH REFLEX TO MICROSCOPIC
Bilirubin,Ur: NEGATIVE ^^L
Blood,UA: NEGATIVE ^^L
Glucose, Ur: NEGATIVE mg/dL
Ketones, UA: NEGATIVE mg/dL
Leuk Esterase,UA: NEGATIVE ^^L
Nitrite,UA: NEGATIVE ^^L
Protein,UA: NEGATIVE mg/dL
Specific Gravity,UA: 1.015 ^^L (ref 1.005–1.030)
Urobilinogen,UA: 0.2 mg/dL
pH: 7.5 ^^L (ref 5.0–8.0)

## 2016-04-19 LAB — PREGNANCY TEST, SERUM: Preg,Serum: NEGATIVE ^^L

## 2016-04-21 LAB — AEROBIC CULTURE

## 2016-04-22 ENCOUNTER — Other Ambulatory Visit: Payer: Self-pay

## 2016-04-24 ENCOUNTER — Encounter: Payer: Self-pay | Admitting: Emergency Medicine

## 2016-04-24 ENCOUNTER — Emergency Department
Admission: EM | Admit: 2016-04-24 | Disposition: A | Discharge status: Home / Self Care | Payer: Self-pay | Source: Ambulatory Visit | Attending: Emergency Medicine | Admitting: Emergency Medicine

## 2016-04-24 LAB — URINALYSIS WITH MICROSCOPIC
Ketones, UA: NEGATIVE
Leuk Esterase,UA: NEGATIVE
Nitrite,UA: NEGATIVE
Protein,UA: NEGATIVE mg/dL
RBC,UA: 1 /hpf (ref 0–2)
Specific Gravity,UA: 1.01 (ref 1.002–1.030)
WBC,UA: 1 /hpf (ref 0–5)
pH,UA: 7 (ref 5.0–8.0)

## 2016-04-24 LAB — CBC AND DIFFERENTIAL
Baso # K/uL: 0 10*3/uL (ref 0.0–0.1)
Basophil %: 0.2 %
Eos # K/uL: 0.1 10*3/uL (ref 0.0–0.4)
Eosinophil %: 0.5 %
Hematocrit: 38 % (ref 34–45)
Hemoglobin: 12.7 g/dL (ref 11.2–15.7)
IMM Granulocytes #: 0 10*3/uL (ref 0.0–0.1)
IMM Granulocytes: 0.2 %
Lymph # K/uL: 2.9 10*3/uL (ref 1.2–3.7)
Lymphocyte %: 31.3 %
MCH: 29 pg/cell (ref 26–32)
MCHC: 34 g/dL (ref 32–36)
MCV: 85 fL (ref 79–95)
Mono # K/uL: 0.5 10*3/uL (ref 0.2–0.9)
Monocyte %: 4.9 %
Neut # K/uL: 5.9 10*3/uL (ref 1.6–6.1)
Nucl RBC # K/uL: 0 10*3/uL (ref 0.0–0.0)
Nucl RBC %: 0 /100 WBC (ref 0.0–0.2)
Platelets: 271 10*3/uL (ref 160–370)
RBC: 4.5 MIL/uL (ref 3.9–5.2)
RDW: 13.3 % (ref 11.7–14.4)
Seg Neut %: 62.9 %
WBC: 9.3 10*3/uL (ref 4.0–10.0)

## 2016-04-24 LAB — AEROBIC CULTURE

## 2016-04-24 LAB — RUQ PANEL (ED ONLY)
ALT: 15 U/L (ref 0–35)
Albumin: 4.4 g/dL (ref 3.5–5.2)
Alk Phos: 91 U/L (ref 35–105)
Amylase: 61 U/L (ref 28–100)
Bilirubin,Direct: <0.2 mg/dL (ref 0.0–0.3)
Bilirubin,Total: 0.3 mg/dL (ref 0.0–1.2)
Lipase: 36 U/L (ref 13–60)
Total Protein: 8 g/dL — ABNORMAL HIGH (ref 6.3–7.7)

## 2016-04-24 LAB — PLASMA PROF 7 (ED ONLY)
Anion Gap,PL: 12 (ref 7–16)
CO2,Plasma: 24 mmol/L (ref 20–28)
Chloride,Plasma: 100 mmol/L (ref 96–108)
Creatinine: 0.44 mg/dL — ABNORMAL LOW (ref 0.51–0.95)
GFR,Black: 163 *
GFR,Caucasian: 141 *
Glucose,Plasma: 83 mg/dL (ref 60–99)
Potassium,Plasma: 4.2 mmol/L (ref 3.4–4.7)
Sodium,Plasma: 136 mmol/L (ref 133–145)
UN,Plasma: 8 mg/dL (ref 6–20)

## 2016-04-24 LAB — BLOOD BANK HOLD RED

## 2016-04-24 LAB — BLOOD BANK HOLD LAVENDER

## 2016-04-24 LAB — PREGNANCY TEST, SERUM: Preg,Serum: NEGATIVE

## 2016-04-24 LAB — HOLD BLUE

## 2016-04-24 MED ORDER — SODIUM CHLORIDE 0.9 % IV BOLUS *I*
1000.0000 mL | Freq: Once | Status: AC
Start: 2016-04-24 — End: 2016-04-24
  Administered 2016-04-24: 1000 mL via INTRAVENOUS

## 2016-04-24 MED ORDER — KETOROLAC TROMETHAMINE 30 MG/ML IJ SOLN *I*
30.0000 mg | Freq: Once | INTRAMUSCULAR | Status: AC
Start: 2016-04-24 — End: 2016-04-24
  Administered 2016-04-24: 30 mg via INTRAVENOUS
  Filled 2016-04-24: qty 1

## 2016-04-24 MED ORDER — HYDROCODONE-ACETAMINOPHEN 5-325 MG PO TABS *I*
1.0000 | ORAL_TABLET | Freq: Once | ORAL | Status: AC
Start: 2016-04-24 — End: 2016-04-24
  Administered 2016-04-24: 1 via ORAL
  Filled 2016-04-24: qty 1

## 2016-04-24 MED ORDER — ONDANSETRON HCL 2 MG/ML IV SOLN *I*
4.0000 mg | Freq: Once | INTRAMUSCULAR | Status: AC
Start: 2016-04-24 — End: 2016-04-24
  Administered 2016-04-24: 4 mg via INTRAVENOUS
  Filled 2016-04-24: qty 2

## 2016-04-24 MED ORDER — MORPHINE SULFATE 4 MG/ML IV SOLN *WRAPPED*
4.0000 mg | INTRAVENOUS | Status: DC | PRN
Start: 2016-04-24 — End: 2016-04-24

## 2016-04-24 NOTE — Discharge Instructions (Signed)
Please call your primary care doctor to schedule a follow-up appointment for this visit.  Seek care if you have Worsening pain, vomiting, inability to eat or drink, or any other concerns.

## 2016-04-24 NOTE — First Provider Contact (Signed)
ED Medical Screening Exam Note    Initial provider evaluation performed by   ED First Provider Contact     Date/Time Event User Comments    04/24/16 1145 ED Provider First Contact Kratos Ruscitti Initial Face to Face Provider Contact        The patient is an otherwise healthy 25 year old female presenting to [facility] ED with abdominal pain, nausea and vomiting.  Epigastric/RUQ, radiating to the back.  She had chronic problems with this in the past year but the pain recurred over the last week.      Vital signs reviewed.    Orders placed:  LABS, U/S and ANALGESIA     Patient requires further evaluation.       Orvan SeenPaolo,Nicholas Aren Cherne, MD, 04/24/2016, 11:45 AM         Orvan SeenGrenga, Kayston Jodoin Nicholas, MD  Resident  04/24/16 [id number]

## 2016-04-24 NOTE — ED Provider Notes (Addendum)
History     Chief Complaint   Patient presents with    Abdominal Pain     HPI Comments: 25 year old female with a PMH of horseshoe kidney who presents to the ED following the sudden onset of RUQ abdominal pain and diarrhea today at approx 6:00 am today.  Reports she was at work today when she experienced the abrupt onset of the pain; she works at a coffee shop.  States it is sharp and stabbing in quality and radiates into the right side of her back.  Reports one previous episode, approx one year ago and she was told it was her gallbladder at that time.  She also had this pain approx one week ago; at that time, she states she went to [facility] and she was found to have hematuria.  Reports the pain is constant, 8/10 in severity; no alleviating or aggravating factors.  States the diarrhea consisted of four episodes of loose, water stools; denies bloody/tarry/black stools.  Associated with nausea.    - Reports she has an IUD.  Denies vaginal discharge, vaginal bleeding, pelvic pain.  - Denies fever, rigors, vomiting, dysuria, flank pain and hematuria.  - PSH of appendectomy      History provided by:  Patient  Language interpreter used: No    Is this ED visit related to civilian activity for income:  Not work related    Past Medical History:   Diagnosis Date    Anxiety     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    Depression     Fibromyalgia     Hypothyroid         Past Surgical History:   Procedure Laterality Date    APPENDECTOMY      BUNIONECTOMY      lithotripsy       History reviewed. No pertinent family history.    Social History    reports that she has never smoked. She does not have any smokeless tobacco history on file. She reports that she drinks alcohol. She reports that she does not currently engage in sexual activity. She reports using the following method of birth control/protection: OCP. She reports that she does not use illicit drugs.    Living Situation     Questions  Responses    Patient lives with Family    Homeless No    Caregiver for other family member     External Services     Employment     Domestic Violence Risk           Problem List     Patient Active Problem List   Diagnosis Code    Calculi, ureter N20.1    GERD (gastroesophageal reflux disease) K21.9    Hypothyroid E03.9    Bronchitis J40    Pneumonia J18.9    Anxiety F41.9    Depression F32.9    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    Renal calculi N20.0    SVT (supraventricular tachycardia) I47.1    Fibromyalgia M79.7       Review of Systems   Review of Systems   Constitutional: Negative for chills and fever.   HENT: Negative for congestion, rhinorrhea, sneezing and sore throat.    Eyes: Negative for photophobia and visual disturbance.   Respiratory: Negative for cough, chest tightness, shortness of breath and wheezing.    Cardiovascular: Negative for chest pain and palpitations.   Gastrointestinal: Positive for abdominal pain, diarrhea and nausea. Negative  for abdominal distention, blood in stool and vomiting.   Endocrine: Negative for cold intolerance and heat intolerance.   Genitourinary: Negative for decreased urine volume, difficulty urinating, dysuria, flank pain, frequency, hematuria, menstrual problem, pelvic pain, urgency, vaginal bleeding, vaginal discharge and vaginal pain.   Musculoskeletal: Positive for back pain. Negative for arthralgias and myalgias.   Skin: Negative for rash and wound.   Allergic/Immunologic: Negative for immunocompromised state.   Neurological: Negative for dizziness, syncope, light-headedness and headaches.   Hematological: Does not bruise/bleed easily.   Psychiatric/Behavioral: The patient is not nervous/anxious and is not hyperactive.        Physical Exam     ED Triage Vitals   BP Heart Rate Heart Rate (via Pulse Ox) Resp Temp Temp src SpO2 O2 Device O2 Flow Rate   04/24/16 1146 04/24/16 1146 -- 04/24/16 1146 04/24/16 1146 04/24/16 1146 04/24/16 1146 04/24/16 1146 --   137/90 96  18  36.8 C (98.2 F) TEMPORAL 100 % None (Room air)       Weight           04/24/16 1146           104.3 kg (230 lb)                    Physical Exam   Constitutional: She is oriented to person, place, and time. She appears well-developed and well-nourished.   Non-toxic appearing, non-ill appearing.   HENT:   Head: Normocephalic and atraumatic.   Nose: Nose normal.   Mouth/Throat: Oropharynx is clear and moist. No oropharyngeal exudate.   Eyes: Conjunctivae and EOM are normal. Pupils are equal, round, and reactive to light. No scleral icterus.   Neck: Normal range of motion. Neck supple. No tracheal deviation present. No thyromegaly present.   Cardiovascular: Normal rate, regular rhythm, normal heart sounds and intact distal pulses.    Pulmonary/Chest: Effort normal and breath sounds normal. No stridor. No respiratory distress. She has no wheezes.   Abdominal: Soft. Bowel sounds are normal. She exhibits no distension and no mass. There is tenderness. There is no rebound and no guarding. No hernia.   Focal RUQ tenderness to palpation.  No rebound, guarding nor rigidity.  No CVA tenderness to palpation.   Musculoskeletal: Normal range of motion. She exhibits no edema, tenderness or deformity.   Lymphadenopathy:     She has no cervical adenopathy.   Neurological: She is alert and oriented to person, place, and time. She has normal reflexes. She exhibits normal muscle tone. Coordination normal.   Skin: Skin is warm and dry. No rash noted. She is not diaphoretic. No erythema. No pallor.   Psychiatric: Her behavior is normal. Judgment normal.   Nursing note and vitals reviewed.      Medical Decision Making      Amount and/or Complexity of Data Reviewed  Clinical lab tests: ordered and reviewed  Tests in the radiology section of CPT: ordered and reviewed  Tests in the medicine section of CPT: ordered and reviewed  Discussion of test results with the performing providers: yes  Decide to obtain previous medical records or to  obtain history from someone other than the patient: yes  Review and summarize past medical records: yes  Independent visualization of images, tracings, or specimens: yes        Initial Evaluation:  ED First Provider Contact     Date/Time Event User Comments    04/24/16 1145 ED Provider First Contact GRENGA, PAOLO Initial  Face to Face Provider Contact        Patient seen by me today 04/24/2016 at 1:12 PM    Assessment:  25 y.o.female with a PMH of horseshoe kidney who comes to the ED with RUQ pain that radiates around to her mid back as well as loose, watery stools and nausea.  The presentation is concerning for biliary pathology including - biliary colic vs acute cholecystitis vs choledocholithiasis - given the location of the pain and the patient's risk factors for biliary disease (female, fertile, higher BMI).  Low suspicion for cholangitis as the patient is afebrile and has no jaundice.  In addition to biliary disease, concern for acute pancreatitis and/or ureterolithiasis +//- hydronephrosis.    - Low clinical index for Fitz-Hugh-Curtis syndrome as the patient has no history of STIs and is not high risk for sexually transmitted diseases (sexually active with her significant other only).  - Low clinical index for perforated viscus; no peritoneal signs on physical examination (no rebound tenderness, guarding or rigidity).  - Low clinical index for cardiac ischemia due to the patient's young age (25 years old) and lack of cardiovascular risk factors; however, will evaluate with ECG as female patient's may present with RUQ pain in the setting of ACS.    Differential Diagnosis includes biliary colic, acute cholecystitis, functional gallbladder disorder, acute pancreatitis, acute hepatitis, PUD, nonulcer dyspepsia, subhepatic or intraabdominal abscess, ureterolithiasis, hydronephrosis, electrolyte abnormality, metabolic derangement, pregnancy.                      Plan:   Labs - CBC, plasma 7, RUQ panel, BhCG,  UA  Imaging - RUQ US  Therapeutic - toradol 30 mg IV given by acute intake provider, will order zofran 4 mg IV, 1000 mL of 0.9% NS IV and norco 5-325 mg po    Vicente Maleshristine Marie Johnstone, DO           Vicente MalesJohnstone, Christine Marie, [place]DO  Resident  04/24/16 1558      Resident Attestation:     Patient seen by me on arrival date of 04/24/2016 at 1350    History:   I reviewed this patient, reviewed the resident's note and agree.  Exam:   I examined this patient, reviewed the resident's note and agree.    Decision Making:   I discussed with the resident his/her documented decision making  and agree.        Author Zollie BeckersJason Micharl Helmes, MD     Zollie Beckersotoli, Ingram Onnen, MD  04/25/16 2118

## 2016-04-24 NOTE — ED Triage Notes (Signed)
Right side abdominal pain with nausea, frequent bowel movements, and different colored stools x 1 week. Has symptoms chronically and worsened in last week.        Triage Note   Casey AreolaPatricia Marie Shatina Streets, RN

## 2016-04-24 NOTE — ED Provider Progress Notes (Signed)
ED Provider Progress Note    Urine remarkable for 1+ blood.  CT abdomen and pelvis without contrast obtained for possible nephrolithiasis.  This was unremarkable.  Patient's pain has improved.  We will discharge patient home with return precautions and follow-up instructions       Alisa GraffJohn Daniel Emmeline Winebarger, DO, 04/24/2016, 6:53 PM         Alisa GraffBallew, Ulysee Fyock Daniel, DO  Resident  04/24/16 [phone]

## 2016-04-24 NOTE — ED Notes (Signed)
Pt alert and oriented NAD. Pt c/o R flank pain x 1 wk, radiating to R abdominal x1 day. Pt endorses 1 day N/D, denies C/V. Pt reports similar symptoms 1 yr prior, diagnoses as gall bladder; untreated [place] and reports PCP unwilling to tx. Pt denies other complaints at this time. Call light within reach, family at bedside.

## 2016-04-24 NOTE — ED Notes (Signed)
Pt alert oriented and ambulatory NAD. Pt IV removed and discharge teaching reviewed, pt verbalized understanding.

## 2016-04-27 LAB — AEROBIC CULTURE

## 2016-05-16 DIAGNOSIS — F603 Borderline personality disorder: Secondary | ICD-10-CM | POA: Insufficient documentation

## 2016-06-16 ENCOUNTER — Other Ambulatory Visit: Payer: Self-pay | Admitting: Emergency Medicine

## 2016-06-16 LAB — D-DIMER, QUANTITATIVE: D-Dimer: 233 ng/mL D-DU (ref 0–250)

## 2016-07-02 ENCOUNTER — Other Ambulatory Visit: Payer: Self-pay | Admitting: Pediatrics

## 2016-07-02 LAB — CBC AND DIFFERENTIAL
Baso # K/uL: 0 10*3/uL (ref 0.0–0.1)
Basophil %: 0.4 % (ref 0.1–1.2)
Eos # K/uL: 0.1 10*3/uL (ref 0.0–0.4)
Eosinophil %: 0.8 % (ref 0.7–5.8)
Hematocrit: 39.5 % (ref 34.1–44.9)
Hemoglobin: 12.7 g/dL (ref 11.2–15.7)
Immature Granulocytes Absolute: 0.02 10*3/uL (ref 0.0–0.2)
Immature Granulocytes: 0.3 % (ref 0.0–2.0)
Lymph # K/uL: 2.1 10*3/uL (ref 1.2–3.7)
Lymphocyte %: 29 % (ref 19.3–51.7)
MCH: 28.5 pg (ref 25.6–32.2)
MCHC: 32.2 g/dL (ref 32.2–35.5)
MCV: 88.8 fL (ref 79.4–94.8)
Mono # K/uL: 0.4 10*3/uL (ref 0.2–0.9)
Monocyte %: 5.4 % (ref 4.7–12.5)
Neut # K/uL: 4.7 10*3/uL (ref 1.6–6.0)
Nucl RBC # K/uL: 0 /100 WBC (ref 0.0–0.2)
Platelets: 295 10*3/uL (ref 182–369)
RBC Distribution Width-SD: 44.5 fL (ref 36.4–46.3)
RBC: 4.45 10*6/uL (ref 3.93–5.22)
RDW: 13.7 % (ref 11.7–14.4)
Seg Neut %: 64.1 % (ref 34.0–71.1)
WBC: 7.3 10*3/uL (ref 4.0–10.0)

## 2016-07-02 LAB — COMPREHENSIVE METABOLIC PANEL
ALT: 10 U/L (ref 0–35)
AST: 17 U/L (ref 0–35)
Albumin: 4.4 g/dL (ref 3.5–5.2)
Alk Phos: 86 U/L (ref 35–105)
Anion Gap: 11 ^^L (ref 7–16)
Bilirubin,Total: 0.3 mg/dL (ref 0.0–1.2)
CO2: 27 mmol/L (ref 20–28)
Calcium: 9.3 mg/dL (ref 9.0–10.4)
Chloride: 100 mmol/L (ref 96–108)
Creatinine: 0.51 mg/dL (ref 0.51–0.95)
GFR,Black: 179 mL/min/{1.73_m2}
GFR,Caucasian: 148 mL/min/{1.73_m2}
Glucose: 90 mg/dL (ref 60–99)
Lab: 12 mg/dL (ref 6–20)
Potassium: 3.9 mmol/L (ref 3.4–4.7)
Sodium: 138 mmol/L (ref 133–145)
Total Protein: 8.4 g/dL — ABNORMAL HIGH (ref 6.3–7.7)

## 2016-07-02 LAB — TSH: TSH: 0.33 u[IU]/mL (ref 0.27–4.20)

## 2016-07-08 ENCOUNTER — Ambulatory Visit: Payer: Self-pay | Admitting: Neurology

## 2016-07-12 ENCOUNTER — Encounter: Payer: Self-pay | Admitting: Neurology

## 2016-09-16 ENCOUNTER — Other Ambulatory Visit: Payer: Self-pay | Admitting: Family Medicine

## 2016-09-18 LAB — AEROBIC CULTURE

## 2016-09-26 ENCOUNTER — Other Ambulatory Visit: Payer: Self-pay

## 2016-09-26 LAB — HEPATITIS B SURFACE ANTIBODY: Hep B S Ab: 30 ^^L

## 2016-09-26 LAB — HEPATITIS B SURFACE ANTIGEN: HBV S AG: NONREACTIVE ^^L

## 2016-09-26 LAB — HEPATITIS C ANTIBODY: Hepatitis C Ab: NONREACTIVE ^^L

## 2016-09-27 LAB — SYPHILIS SCREEN
Syphilis Screen: NEGATIVE ^^L
Syphilis Status: NONREACTIVE ^^L

## 2016-09-27 LAB — HSV 2 ANTIBODY, IGG: HSV 2 IgG: <0.2 AI

## 2016-09-27 LAB — HSV 1 ANTIBODY, IGG: HSV 1 IgG: 0.3 AI

## 2016-09-27 LAB — HIV 1&2 ANTIGEN/ANTIBODY: HIV 1&2 ANTIGEN/ANTIBODY: NONREACTIVE ^^L

## 2016-09-27 LAB — AEROBIC CULTURE

## 2016-09-28 LAB — HERPES SIMPLEX VIRUS TYPE 1AND/OR 2 IGM BY ELISA: HSV I/II Comb IgM: 1.47 IV — ABNORMAL HIGH (ref <=0.89)

## 2016-09-29 LAB — GYN CYTOLOGY

## 2016-11-05 ENCOUNTER — Encounter: Payer: Self-pay | Admitting: Radiology

## 2016-11-05 ENCOUNTER — Other Ambulatory Visit: Payer: Self-pay | Admitting: Emergency Medicine

## 2016-11-05 DIAGNOSIS — S2020XA Contusion of thorax, unspecified, initial encounter: Secondary | ICD-10-CM

## 2016-11-05 DIAGNOSIS — S6992XA Unspecified injury of left wrist, hand and finger(s), initial encounter: Secondary | ICD-10-CM

## 2016-11-05 DIAGNOSIS — S0990XA Unspecified injury of head, initial encounter: Secondary | ICD-10-CM

## 2016-11-05 DIAGNOSIS — S3991XA Unspecified injury of abdomen, initial encounter: Secondary | ICD-10-CM

## 2016-11-05 DIAGNOSIS — S199XXA Unspecified injury of neck, initial encounter: Secondary | ICD-10-CM

## 2016-11-05 DIAGNOSIS — Q631 Lobulated, fused and horseshoe kidney: Secondary | ICD-10-CM

## 2016-11-05 LAB — COMPREHENSIVE METABOLIC PANEL
ALT: 13 U/L (ref 0–35)
AST: 20 U/L (ref 0–35)
Albumin: 4 g/dL (ref 3.5–5.2)
Alk Phos: 93 U/L (ref 35–105)
Anion Gap: 10 ^^L (ref 7–16)
Bilirubin,Total: 0.3 mg/dL (ref 0.0–1.2)
CO2: 26 mmol/L (ref 20–28)
Calcium: 8.7 mg/dL — ABNORMAL LOW (ref 8.8–10.2)
Chloride: 101 mmol/L (ref 96–108)
Creatinine: 0.49 mg/dL — ABNORMAL LOW (ref 0.51–0.95)
GFR,Black: 186 mL/min/{1.73_m2}
GFR,Caucasian: 154 mL/min/{1.73_m2}
Glucose: 102 mg/dL — ABNORMAL HIGH (ref 60–99)
Lab: 9 mg/dL (ref 6–20)
Potassium: 3.9 mmol/L (ref 3.4–4.7)
Sodium: 137 mmol/L (ref 133–145)
Total Protein: 7.6 g/dL (ref 6.3–7.7)

## 2016-11-05 LAB — URINALYSIS WITH REFLEX TO MICROSCOPIC
Bilirubin,Ur: NEGATIVE ^^L
Glucose, Ur: NEGATIVE mg/dL
Ketones, UA: NEGATIVE mg/dL
Leuk Esterase,UA: NEGATIVE ^^L
Nitrite,UA: NEGATIVE ^^L
Protein,UA: NEGATIVE mg/dL
RBC,UA: >40 /HPF — ABNORMAL HIGH (ref 0–5)
Specific Gravity,UA: 1.034 ^^L — ABNORMAL HIGH (ref 1.005–1.030)
Urobilinogen,UA: 0.2 mg/dL
WBC,UA: 0 /HPF (ref 0–5)
pH: 7 ^^L (ref 5.0–8.0)

## 2016-11-05 LAB — CBC AND DIFFERENTIAL
Baso # K/uL: 0.1 10*3/uL (ref 0.0–0.1)
Basophil %: 0.4 % (ref 0.1–1.2)
Eos # K/uL: 0 10*3/uL (ref 0.0–0.4)
Eosinophil %: 0.2 % — ABNORMAL LOW (ref 0.7–5.8)
Hematocrit: 37.6 % (ref 34.1–44.9)
Hemoglobin: 12.7 g/dL (ref 11.2–15.7)
Immature Granulocytes Absolute: 0.12 10*3/uL (ref 0.0–0.2)
Immature Granulocytes: 0.7 % (ref 0.0–2.0)
Lymph # K/uL: 1.4 10*3/uL (ref 1.2–3.7)
Lymphocyte %: 8.1 % — ABNORMAL LOW (ref 19.3–51.7)
MCH: 30.2 pg (ref 25.6–32.2)
MCHC: 33.8 g/dL (ref 32.2–35.5)
MCV: 89.3 fL (ref 79.4–94.8)
Mono # K/uL: 0.9 10*3/uL (ref 0.2–0.9)
Monocyte %: 5.1 % (ref 4.7–12.5)
Neut # K/uL: 14.4 10*3/uL — ABNORMAL HIGH (ref 1.6–6.0)
Nucl RBC # K/uL: 0 /100 WBC (ref 0.0–0.2)
Platelets: 270 10*3/uL (ref 182–369)
RBC Distribution Width-SD: 44.1 fL (ref 36.4–46.3)
RBC: 4.21 10*6/uL (ref 3.93–5.22)
RDW: 13.6 % (ref 11.7–14.4)
Seg Neut %: 85.5 % — ABNORMAL HIGH (ref 34.0–71.1)
WBC: 16.8 10*3/uL — ABNORMAL HIGH (ref 4.0–10.0)

## 2016-11-05 LAB — APTT: aPTT: 29 seconds (ref 23.5–34.9)

## 2016-11-05 LAB — INR FFT: INR: 1.09 ^^L (ref 0.88–1.17)

## 2016-11-05 LAB — ALCOHOL SCREEN: Ethanol: <14 mg/dL (ref 0–14)

## 2016-11-05 LAB — SALICYLATE LEVEL: Salicylate: <0.3 mg/dL — ABNORMAL LOW (ref 15–30)

## 2016-11-05 LAB — PREGNANCY TEST, SERUM: Preg,Serum: NEGATIVE ^^L

## 2016-11-05 LAB — ACETAMINOPHEN LEVEL: Acetaminophen: <9 ug/mL — ABNORMAL LOW (ref 10–30)

## 2016-12-08 ENCOUNTER — Other Ambulatory Visit: Payer: Self-pay | Admitting: Pediatrics

## 2016-12-08 LAB — URINALYSIS WITH REFLEX TO MICROSCOPIC
Bilirubin,Ur: NEGATIVE ^^L
Glucose, Ur: NEGATIVE mg/dL
Ketones, UA: NEGATIVE mg/dL
Leuk Esterase,UA: NEGATIVE ^^L
Nitrite,UA: NEGATIVE ^^L
Protein,UA: NEGATIVE mg/dL
Specific Gravity,UA: 1.006 ^^L (ref 1.005–1.030)
Urobilinogen,UA: 0.2 mg/dL
pH: 7.5 ^^L (ref 5.0–8.0)

## 2016-12-09 LAB — AEROBIC CULTURE

## 2016-12-16 ENCOUNTER — Encounter: Payer: Medicaid Other | Admitting: Radiology

## 2016-12-16 DIAGNOSIS — M542 Cervicalgia: Secondary | ICD-10-CM

## 2016-12-16 DIAGNOSIS — M25532 Pain in left wrist: Secondary | ICD-10-CM

## 2017-01-16 ENCOUNTER — Other Ambulatory Visit: Payer: Self-pay | Admitting: Pediatrics

## 2017-01-16 LAB — COMPREHENSIVE METABOLIC PANEL
ALT: 11 U/L (ref 0–35)
AST: 15 U/L (ref 0–35)
Albumin: 4.2 g/dL (ref 3.5–5.2)
Alk Phos: 88 U/L (ref 35–105)
Anion Gap: 12 (ref 7–16)
Bilirubin,Total: <0.2 mg/dL (ref 0.0–1.2)
CO2: 24 mmol/L (ref 20–28)
Calcium: 9.3 mg/dL (ref 8.8–10.2)
Chloride: 102 mmol/L (ref 96–108)
Creatinine: 0.62 mg/dL (ref 0.51–0.95)
GFR,Black: 142 mL/min/{1.73_m2}
GFR,Caucasian: 117 mL/min/{1.73_m2}
Glucose: 93 mg/dL (ref 60–99)
Lab: 7 mg/dL (ref 6–20)
Potassium: 3.8 mmol/L (ref 3.4–4.7)
Sodium: 138 mmol/L (ref 133–145)
Total Protein: 7.8 g/dL — ABNORMAL HIGH (ref 6.3–7.7)

## 2017-01-16 LAB — CBC AND DIFFERENTIAL
Baso # K/uL: 0 10*3/uL (ref 0.0–0.1)
Basophil %: 0.5 % (ref 0.1–1.2)
Eos # K/uL: 0.1 10*3/uL (ref 0.0–0.4)
Eosinophil %: 1.6 % (ref 0.7–5.8)
Hematocrit: 38.1 % (ref 34.1–44.9)
Hemoglobin: 12.7 g/dL (ref 11.2–15.7)
Immature Granulocytes Absolute: 0.03 10*3/uL (ref 0.0–0.2)
Immature Granulocytes: 0.3 % (ref 0.0–2.0)
Lymph # K/uL: 2.6 10*3/uL (ref 1.2–3.7)
Lymphocyte %: 29 % (ref 19.3–51.7)
MCH: 29.7 pg (ref 25.6–32.2)
MCHC: 33.3 g/dL (ref 32.2–35.5)
MCV: 89.2 fL (ref 79.4–94.8)
Mono # K/uL: 0.6 10*3/uL (ref 0.2–0.9)
Monocyte %: 7.1 % (ref 4.7–12.5)
Neut # K/uL: 5.4 10*3/uL (ref 1.6–6.0)
Nucl RBC # K/uL: 0 /100 WBC (ref 0.0–0.2)
Platelets: 250 10*3/uL (ref 182–369)
RBC Distribution Width-SD: 44.9 fL (ref 36.4–46.3)
RBC: 4.27 10*6/uL (ref 3.93–5.22)
RDW: 13.8 % (ref 11.7–14.4)
Seg Neut %: 61.5 % (ref 34.0–71.1)
WBC: 8.9 10*3/uL (ref 4.0–10.0)

## 2017-01-16 LAB — AMYLASE: Amylase: 73 U/L (ref 28–100)

## 2017-01-16 LAB — T3: T3: 145.7 ng/dL (ref 80.0–200.0)

## 2017-01-16 LAB — SEDIMENTATION RATE, AUTOMATED: Sedimentation Rate: 45 mm/hr — ABNORMAL HIGH (ref 0–20)

## 2017-01-16 LAB — TSH: TSH: 0.79 u[IU]/mL (ref 0.27–4.20)

## 2017-01-16 LAB — T4, FREE: Free T4: 1.37 ng/dL (ref 0.90–1.70)

## 2017-03-13 ENCOUNTER — Other Ambulatory Visit: Payer: Self-pay | Admitting: Pediatrics

## 2017-03-13 LAB — RHEUMATOID FACTOR,SCREEN: RF Quant: <10 IU/mL (ref 0–14)

## 2017-03-13 LAB — CRP: CRP: 12 mg/L — ABNORMAL HIGH (ref 0–10)

## 2017-03-13 LAB — SEDIMENTATION RATE, AUTOMATED: Sedimentation Rate: 58 mm/hr — ABNORMAL HIGH (ref 0–20)

## 2017-03-15 LAB — ANA TITER/PATTERN: ANA Titer: 80 — AB

## 2017-03-15 LAB — CYCLIC CITRULLINATED PEPTIDE: Cyclic Citrullin Peptide Ab: 0.7 U/mL (ref 0.0–2.9)

## 2017-03-15 LAB — ANTINUCLEAR ANTIBODY SCREEN: ANA Screen: POSITIVE — AB

## 2017-03-22 DIAGNOSIS — R1084 Generalized abdominal pain: Secondary | ICD-10-CM | POA: Insufficient documentation

## 2017-03-22 DIAGNOSIS — R197 Diarrhea, unspecified: Secondary | ICD-10-CM | POA: Insufficient documentation

## 2017-03-22 DIAGNOSIS — R110 Nausea: Secondary | ICD-10-CM | POA: Insufficient documentation

## 2017-03-22 DIAGNOSIS — K625 Hemorrhage of anus and rectum: Secondary | ICD-10-CM | POA: Insufficient documentation

## 2017-03-22 DIAGNOSIS — R1011 Right upper quadrant pain: Secondary | ICD-10-CM | POA: Diagnosis present

## 2017-03-24 ENCOUNTER — Other Ambulatory Visit: Payer: Self-pay | Admitting: Internal Medicine

## 2017-03-24 ENCOUNTER — Encounter: Payer: Medicaid Other | Admitting: Radiology

## 2017-03-24 DIAGNOSIS — K625 Hemorrhage of anus and rectum: Secondary | ICD-10-CM

## 2017-03-24 DIAGNOSIS — Q631 Lobulated, fused and horseshoe kidney: Secondary | ICD-10-CM

## 2017-03-24 LAB — CBC AND DIFFERENTIAL
Baso # K/uL: 0.1 10*3/uL (ref 0.0–0.1)
Basophil %: 0.5 % (ref 0.1–1.2)
Eos # K/uL: 0.1 10*3/uL (ref 0.0–0.4)
Eosinophil %: 0.9 % (ref 0.7–5.8)
Hematocrit: 38.3 % (ref 34.1–44.9)
Hemoglobin: 12.9 g/dL (ref 11.2–15.7)
Immature Granulocytes Absolute: 0.05 10*3/uL (ref 0.0–0.2)
Immature Granulocytes: 0.5 % (ref 0.0–2.0)
Lymph # K/uL: 2.3 10*3/uL (ref 1.2–3.7)
Lymphocyte %: 21.7 % (ref 19.3–51.7)
MCH: 30.1 pg (ref 25.6–32.2)
MCHC: 33.7 g/dL (ref 32.2–35.5)
MCV: 89.5 fL (ref 79.4–94.8)
Mono # K/uL: 0.7 10*3/uL (ref 0.2–0.9)
Monocyte %: 6.4 % (ref 4.7–12.5)
Neut # K/uL: 7.4 10*3/uL — ABNORMAL HIGH (ref 1.6–6.0)
Nucl RBC # K/uL: 0 /100 WBC (ref 0.0–0.2)
Platelets: 272 10*3/uL (ref 182–369)
RBC Distribution Width-SD: 44.8 fL (ref 36.4–46.3)
RBC: 4.28 10*6/uL (ref 3.93–5.22)
RDW: 13.7 % (ref 11.7–14.4)
Seg Neut %: 70 % (ref 34.0–71.1)
WBC: 10.6 10*3/uL — ABNORMAL HIGH (ref 4.0–10.0)

## 2017-03-24 LAB — AMYLASE: Amylase: 49 U/L (ref 28–100)

## 2017-03-24 LAB — ALKALINE PHOSPHATASE: Alk Phos: 108 U/L — ABNORMAL HIGH (ref 35–105)

## 2017-03-24 LAB — CREATININE, SERUM
Creatinine: 0.5 mg/dL — ABNORMAL LOW (ref 0.51–0.95)
GFR,Black: 182 mL/min/{1.73_m2}
GFR,Caucasian: 150 mL/min/{1.73_m2}

## 2017-03-24 LAB — BUN: Lab: 10 mg/dL (ref 6–20)

## 2017-03-24 LAB — HIGH SENSITIVITY CRP: CRP,High Sensitivity: 10.82 mg/L — ABNORMAL HIGH (ref 0.00–3.00)

## 2017-03-24 LAB — POTASSIUM: Potassium: 3.9 mmol/L (ref 3.4–4.7)

## 2017-03-24 LAB — ALT: ALT: 11 U/L (ref 0–35)

## 2017-03-24 LAB — SEDIMENTATION RATE, AUTOMATED: Sedimentation Rate: 55 mm/hr — ABNORMAL HIGH (ref 0–20)

## 2017-03-24 LAB — AST: AST: 18 U/L (ref 0–35)

## 2017-03-24 LAB — SODIUM: Sodium: 138 mmol/L (ref 133–145)

## 2017-03-24 LAB — T BILI: Bilirubin,Total: 0.6 mg/dL (ref 0.0–1.2)

## 2017-03-24 LAB — LIPASE: Lipase: 24 U/L (ref 13–60)

## 2017-03-24 LAB — ALBUMIN: Albumin: 4.2 g/dL (ref 3.5–5.2)

## 2017-03-24 LAB — GGT: GGT: 14 U/L (ref 5–36)

## 2017-03-27 LAB — ENDOMYSIAL (EMA) AB IGG: Endomysial IgA AB: <1:10 {titer}

## 2017-03-28 ENCOUNTER — Other Ambulatory Visit: Payer: Self-pay | Admitting: Internal Medicine

## 2017-03-28 LAB — ENDOMYSIAL (EMA) AB IGG: Endomysial IgG: <1:10 {titer}

## 2017-03-29 LAB — SURGICAL PATHOLOGY

## 2017-03-30 ENCOUNTER — Other Ambulatory Visit: Payer: Self-pay | Admitting: Pediatrics

## 2017-03-30 ENCOUNTER — Encounter: Payer: Self-pay | Admitting: Gastroenterology

## 2017-03-30 LAB — COMPREHENSIVE METABOLIC PANEL
ALT: 11 U/L (ref 0–35)
AST: 18 U/L (ref 0–35)
Albumin: 4.3 g/dL (ref 3.5–5.2)
Alk Phos: 104 U/L (ref 35–105)
Anion Gap: 11 (ref 7–16)
Bilirubin,Total: 0.4 mg/dL (ref 0.0–1.2)
CO2: 26 mmol/L (ref 20–28)
Calcium: 9.4 mg/dL (ref 8.8–10.2)
Chloride: 101 mmol/L (ref 96–108)
Creatinine: 0.51 mg/dL (ref 0.51–0.95)
GFR,Black: 178 mL/min/{1.73_m2}
GFR,Caucasian: 147 mL/min/{1.73_m2}
Glucose: 91 mg/dL (ref 60–99)
Lab: 8 mg/dL (ref 6–20)
Potassium: 3.9 mmol/L (ref 3.4–4.7)
Sodium: 138 mmol/L (ref 133–145)
Total Protein: 8.2 g/dL — ABNORMAL HIGH (ref 6.3–7.7)

## 2017-03-30 LAB — CBC AND DIFFERENTIAL
Baso # K/uL: 0 10*3/uL (ref 0.0–0.1)
Basophil %: 0.3 % (ref 0.1–1.2)
Eos # K/uL: 0.1 10*3/uL (ref 0.0–0.4)
Eosinophil %: 0.9 % (ref 0.7–5.8)
Hematocrit: 40 % (ref 34.1–44.9)
Hemoglobin: 13.2 g/dL (ref 11.2–15.7)
Immature Granulocytes Absolute: 0.05 10*3/uL (ref 0.0–0.2)
Immature Granulocytes: 0.5 % (ref 0.0–2.0)
Lymph # K/uL: 2.5 10*3/uL (ref 1.2–3.7)
Lymphocyte %: 24.9 % (ref 19.3–51.7)
MCH: 29.7 pg (ref 25.6–32.2)
MCHC: 33 g/dL (ref 32.2–35.5)
MCV: 90.1 fL (ref 79.4–94.8)
Mono # K/uL: 0.6 10*3/uL (ref 0.2–0.9)
Monocyte %: 6.3 % (ref 4.7–12.5)
Neut # K/uL: 6.8 10*3/uL — ABNORMAL HIGH (ref 1.6–6.0)
Nucl RBC # K/uL: 0 /100 WBC (ref 0.0–0.2)
Platelets: 285 10*3/uL (ref 182–369)
RBC Distribution Width-SD: 44.2 fL (ref 36.4–46.3)
RBC: 4.44 10*6/uL (ref 3.93–5.22)
RDW: 13.5 % (ref 11.7–14.4)
Seg Neut %: 67.1 % (ref 34.0–71.1)
WBC: 10.2 10*3/uL — ABNORMAL HIGH (ref 4.0–10.0)

## 2017-03-30 LAB — URINALYSIS WITH REFLEX TO MICROSCOPIC
Bilirubin,Ur: NEGATIVE
Blood,UA: NEGATIVE
Glucose, Ur: NEGATIVE mg/dL
Ketones, UA: NEGATIVE mg/dL
Leuk Esterase,UA: NEGATIVE
Nitrite,UA: NEGATIVE
Protein,UA: NEGATIVE mg/dL
Specific Gravity,UA: 1.025 (ref 1.005–1.030)
Urobilinogen,UA: 0.2 mg/dL
pH: 6 (ref 5.0–8.0)

## 2017-03-30 LAB — PREGNANCY TEST, SERUM: Preg,Serum: NEGATIVE

## 2017-03-31 ENCOUNTER — Telehealth: Payer: Self-pay

## 2017-03-31 ENCOUNTER — Encounter: Payer: Medicaid Other | Admitting: Radiology

## 2017-03-31 ENCOUNTER — Encounter: Payer: Self-pay | Admitting: Gastroenterology

## 2017-03-31 DIAGNOSIS — M545 Low back pain: Secondary | ICD-10-CM

## 2017-03-31 DIAGNOSIS — Q631 Lobulated, fused and horseshoe kidney: Secondary | ICD-10-CM

## 2017-03-31 DIAGNOSIS — R10811 Right upper quadrant abdominal tenderness: Secondary | ICD-10-CM

## 2017-03-31 LAB — AEROBIC CULTURE

## 2017-03-31 NOTE — Telephone Encounter (Signed)
Referral in review

## 2017-04-03 ENCOUNTER — Other Ambulatory Visit: Payer: Self-pay | Admitting: Pediatrics

## 2017-04-03 ENCOUNTER — Telehealth: Payer: Self-pay

## 2017-04-03 NOTE — Telephone Encounter (Signed)
Left message for patient to call back re: scheduling New Patient Visit.   Diagnosis: POS ANA  Visit to be scheduled next available.   Visit to be scheduled with Awilda MetroJonna Schaper [place]

## 2017-04-13 IMAGING — US US ABDOMEN LIMITED
1 series · 14 of 25 positions shown · non-contrast
Comparison: None.

CLINICAL DATA: Generalized abdominal pain since last night. Nausea
without vomiting since last night.

EXAM:
US ABDOMEN LIMITED - RIGHT UPPER QUADRANT

[Series 1: us abdomen limited · 0.22mm/px · 14 of 42 slices shown]
[im 1/42]
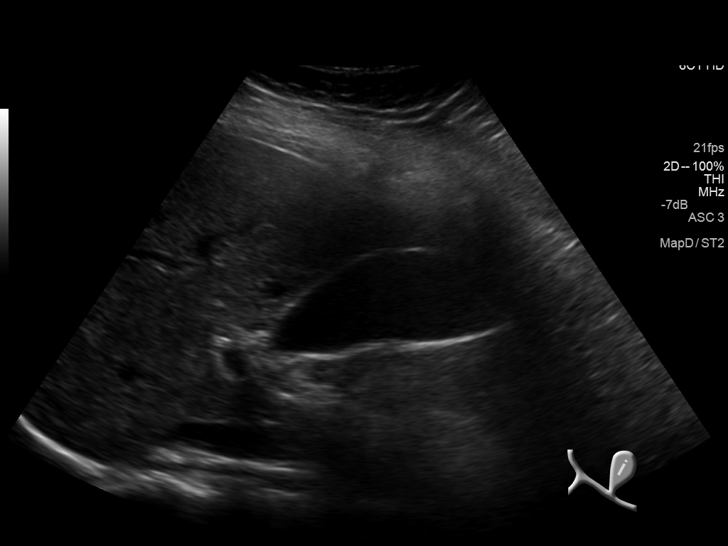
[im 4/42]
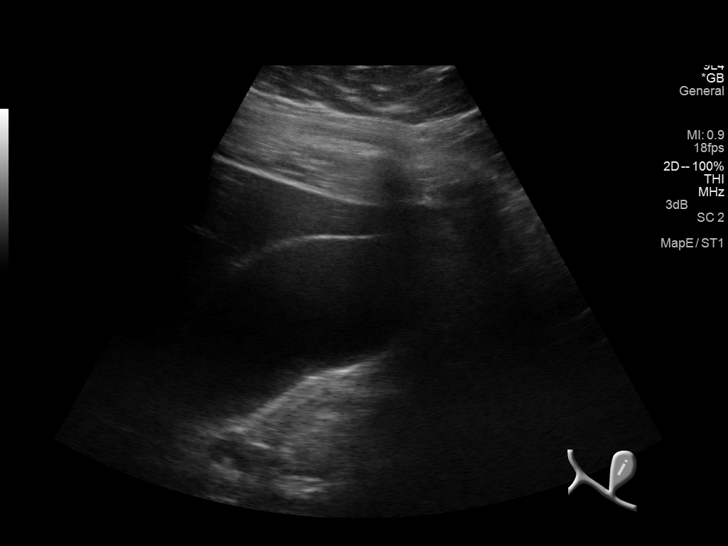
[im 7/42]
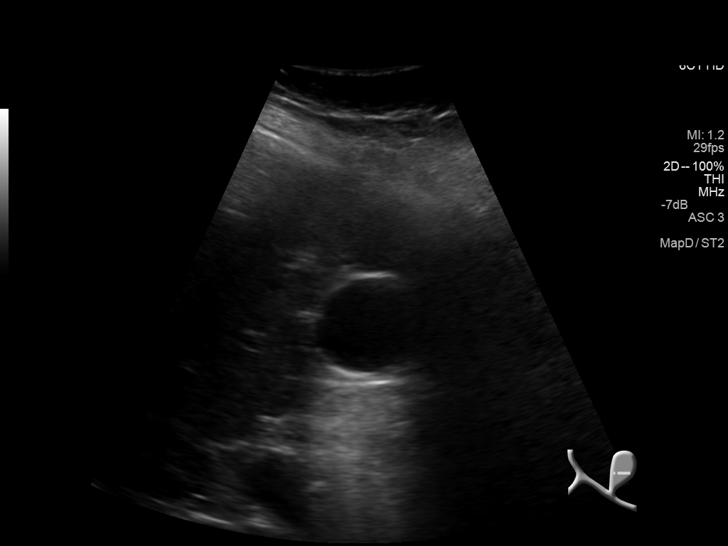
[im 11/42]
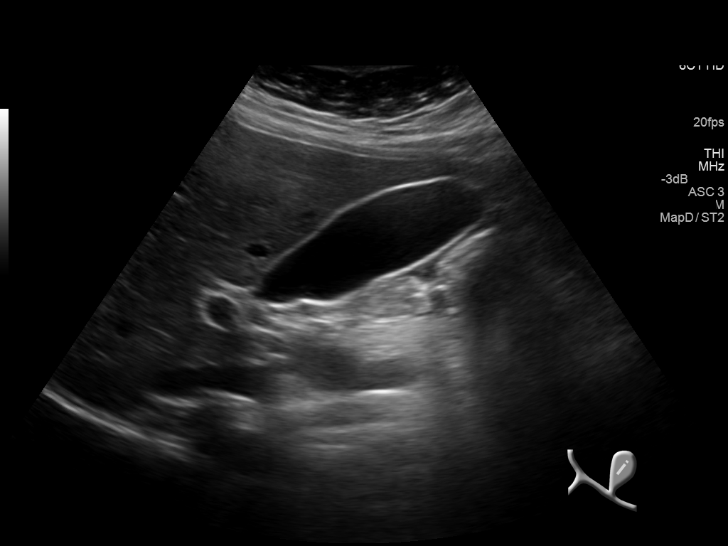
[im 14/42]
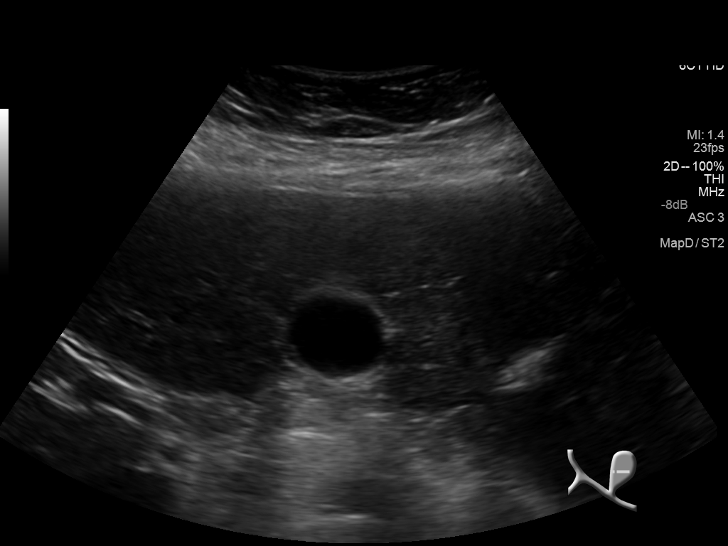
[im 16/42]
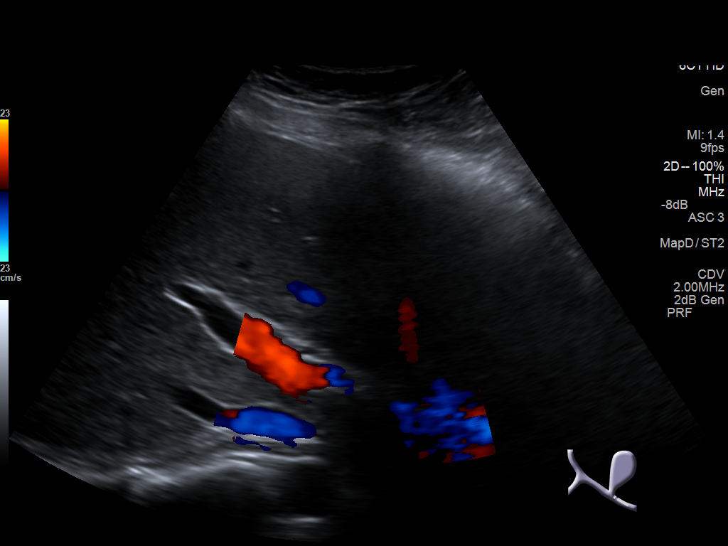
[im 19/42]
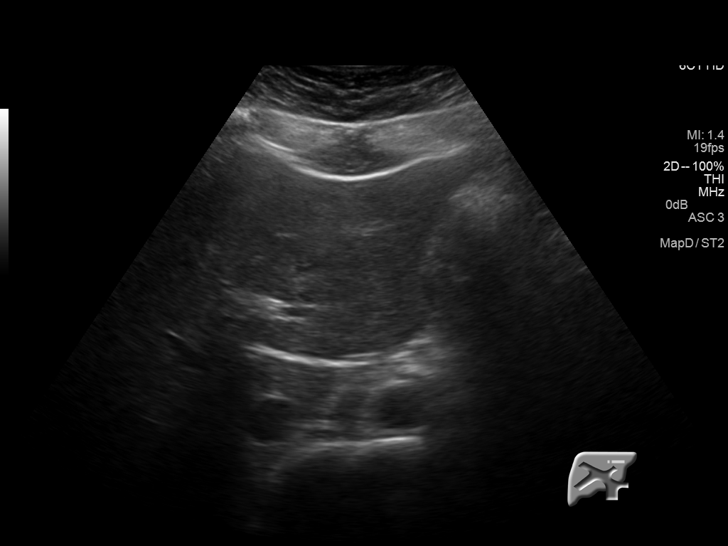
[im 23/42]
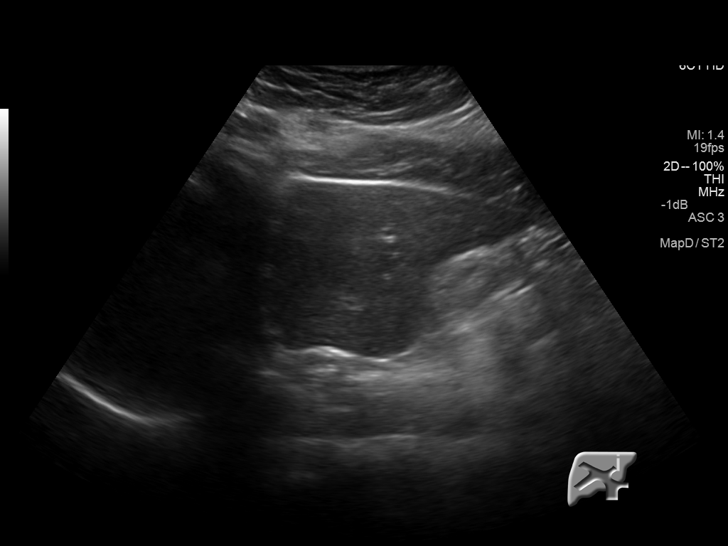
[im 26/42]
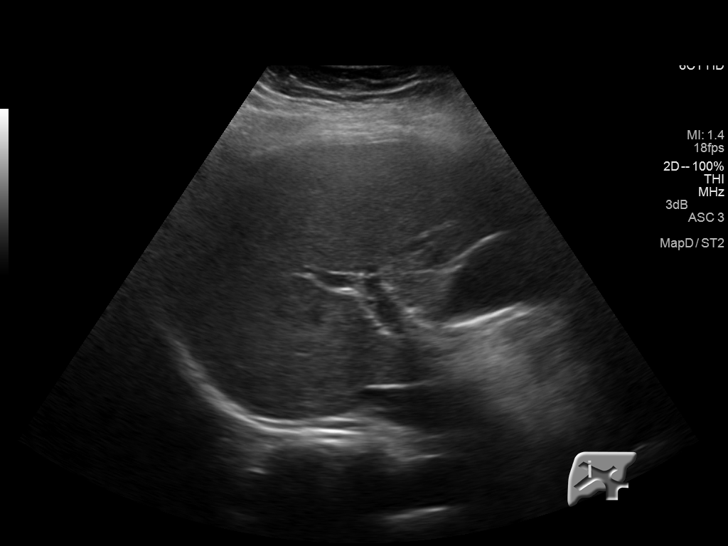
[im 28/42]
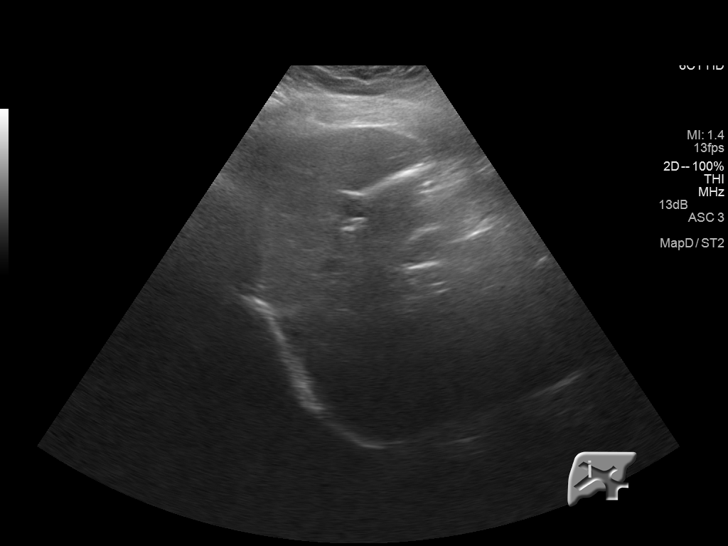
[im 31/42]
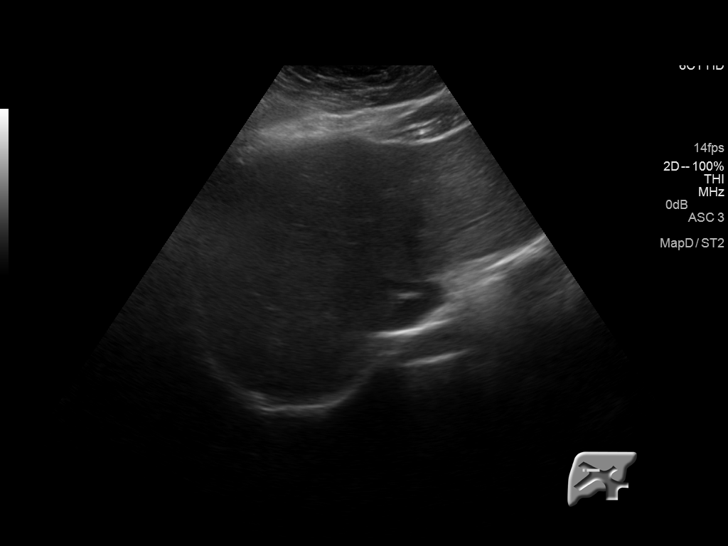
[im 35/42]
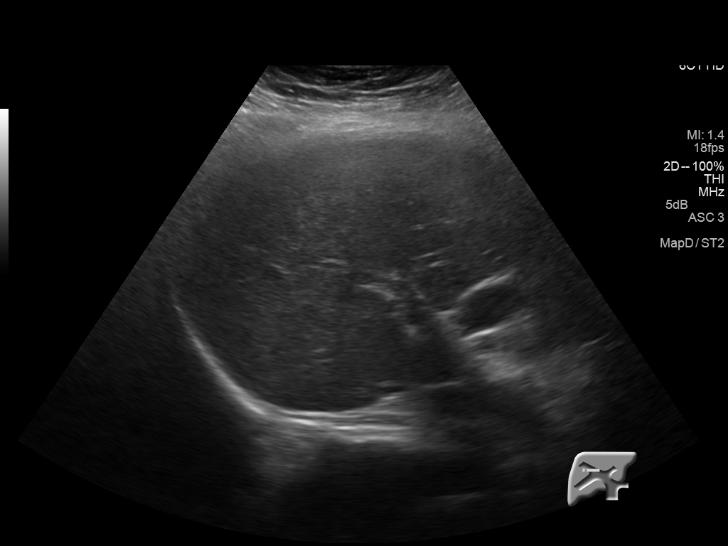
[im 38/42]
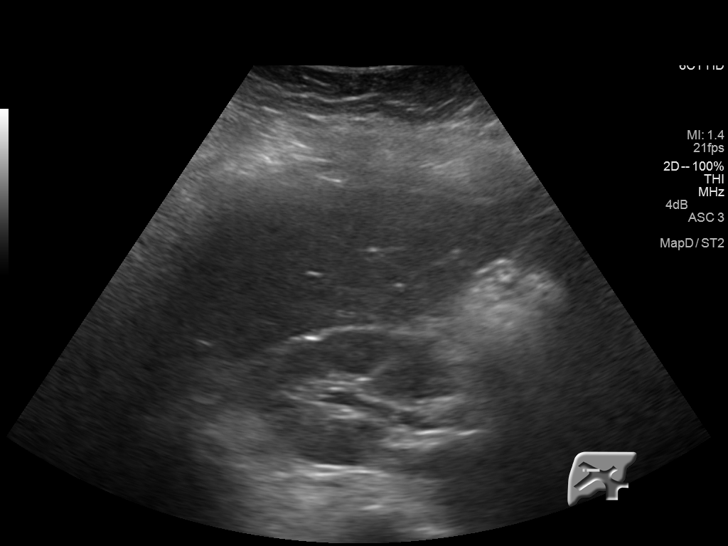
[im 42/42]
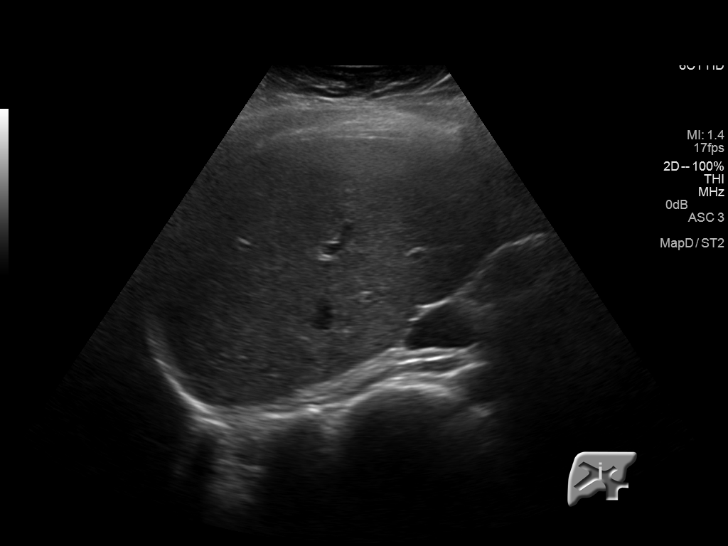

[14 of 25 positions shown; findings below may reference images not displayed]

FINDINGS: Gallbladder:

No gallstones or wall thickening visualized. No sonographic Murphy
sign noted, although this may be unreliable, given that the patient
has been medicated.

Common bile duct:

Diameter: 2.5 mm.

Liver:

No focal lesion identified. Within normal limits in parenchymal
echogenicity.
IMPRESSION: No visible gallstones, gallbladder wall thickening, or biliary
ductal dilatation.

## 2017-04-21 ENCOUNTER — Other Ambulatory Visit: Payer: Self-pay | Admitting: General Practice

## 2017-04-21 LAB — BASIC METABOLIC PANEL
Anion Gap: 11 (ref 7–16)
CO2: 25 mmol/L (ref 20–28)
Calcium: 9.1 mg/dL (ref 8.8–10.2)
Chloride: 101 mmol/L (ref 96–108)
Creatinine: 0.51 mg/dL (ref 0.51–0.95)
GFR,Black: 178 mL/min/{1.73_m2}
GFR,Caucasian: 147 mL/min/{1.73_m2}
Glucose: 85 mg/dL (ref 60–99)
Lab: 8 mg/dL (ref 6–20)
Potassium: 4 mmol/L (ref 3.4–4.7)
Sodium: 137 mmol/L (ref 133–145)

## 2017-05-01 ENCOUNTER — Encounter: Payer: Medicaid Other | Admitting: Radiology

## 2017-05-01 ENCOUNTER — Other Ambulatory Visit: Payer: Self-pay | Admitting: Internal Medicine

## 2017-05-01 DIAGNOSIS — R1084 Generalized abdominal pain: Secondary | ICD-10-CM

## 2017-06-07 ENCOUNTER — Other Ambulatory Visit: Payer: Self-pay

## 2017-06-07 ENCOUNTER — Encounter: Payer: Self-pay | Admitting: Rheumatology

## 2017-06-07 ENCOUNTER — Ambulatory Visit: Payer: Medicaid Other | Attending: Rheumatology | Admitting: Rheumatology

## 2017-06-07 ENCOUNTER — Other Ambulatory Visit: Payer: Self-pay | Admitting: Rheumatology

## 2017-06-07 VITALS — BP 136/63 | HR 81 | Temp 96.1°F | Ht 65.5 in | Wt 237.0 lb

## 2017-06-07 DIAGNOSIS — M549 Dorsalgia, unspecified: Secondary | ICD-10-CM

## 2017-06-07 DIAGNOSIS — Z8639 Personal history of other endocrine, nutritional and metabolic disease: Secondary | ICD-10-CM

## 2017-06-07 DIAGNOSIS — R768 Other specified abnormal immunological findings in serum: Secondary | ICD-10-CM

## 2017-06-07 DIAGNOSIS — Z840 Family history of diseases of the skin and subcutaneous tissue: Secondary | ICD-10-CM

## 2017-06-07 DIAGNOSIS — Z8739 Personal history of other diseases of the musculoskeletal system and connective tissue: Secondary | ICD-10-CM

## 2017-06-07 LAB — SEDIMENTATION RATE, AUTOMATED: Sedimentation Rate: 24 mm/hr — ABNORMAL HIGH (ref 0–20)

## 2017-06-07 LAB — CRP: CRP: 7 mg/L (ref 0–10)
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
## 2017-06-13 DIAGNOSIS — R768 Other specified abnormal immunological findings in serum: Secondary | ICD-10-CM | POA: Insufficient documentation

## 2017-06-13 DIAGNOSIS — R7689 Other specified abnormal immunological findings in serum: Secondary | ICD-10-CM

## 2017-06-13 HISTORY — DX: Other specified abnormal immunological findings in serum: R76.89

## 2017-06-13 HISTORY — DX: Other specified abnormal immunological findings in serum: R76.8

## 2017-06-13 LAB — HLA-B27 ANTIGEN
HLA-B27: NEGATIVE
HLA-B27: NEGATIVE
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
## 2017-06-21 ENCOUNTER — Ambulatory Visit: Payer: Medicaid Other | Attending: Pediatrics | Admitting: Rheumatology

## 2017-06-21 ENCOUNTER — Encounter: Payer: Self-pay | Admitting: Rheumatology

## 2017-06-21 VITALS — BP 136/79 | HR 103 | Temp 96.5°F | Ht 65.0 in | Wt 238.0 lb

## 2017-06-21 DIAGNOSIS — R768 Other specified abnormal immunological findings in serum: Secondary | ICD-10-CM

## 2017-06-21 DIAGNOSIS — M549 Dorsalgia, unspecified: Secondary | ICD-10-CM

## 2017-06-21 LAB — ANTI RNP/SMITH
Anti-RNP: <0.2
Anti-Smith: <0.2
SM/RNP AB: <0.2

## 2017-06-21 LAB — PROTEIN ELECTROPHORESIS, SERUM
A/G Ratio: 1
Albumin: 3.8
Alpha 1: 0.4
Alpha 2: 0.9
Beta: 1
Gamma: 1.4
Interp,PE: NORMAL
Total Protein: 7.5

## 2017-06-21 LAB — ANTI DS-DNA AB: dsDNA Ab: 1

## 2017-06-21 LAB — ANTI-SSA/SSB
Anti-LA/SS-B: <0.2
Anti-RO/SS-A: <0.2

## 2017-06-21 LAB — ANTINUCLEAR ANTIBODY SCREEN: ANA Screen: NEGATIVE

## 2017-07-13 ENCOUNTER — Other Ambulatory Visit: Payer: Self-pay | Admitting: Pediatrics

## 2017-07-13 LAB — CBC AND DIFFERENTIAL
Baso # K/uL: 0.1 10*3/uL (ref 0.0–0.1)
Basophil %: 0.6 % (ref 0.1–1.2)
Eos # K/uL: 0.2 10*3/uL (ref 0.0–0.4)
Eosinophil %: 1.6 % (ref 0.7–5.8)
Hematocrit: 41.3 % (ref 34.1–44.9)
Hemoglobin: 13.4 g/dL (ref 11.2–15.7)
Immature Granulocytes Absolute: 0.03 10*3/uL (ref 0.0–0.2)
Immature Granulocytes: 0.3 % (ref 0.0–2.0)
Lymph # K/uL: 2.7 10*3/uL (ref 1.2–3.7)
Lymphocyte %: 28.5 % (ref 19.3–51.7)
MCH: 30.1 pg (ref 25.6–32.2)
MCHC: 32.4 g/dL (ref 32.2–35.5)
MCV: 92.8 fL (ref 79.4–94.8)
Mono # K/uL: 0.7 10*3/uL (ref 0.2–0.9)
Monocyte %: 7.6 % (ref 4.7–12.5)
Neut # K/uL: 5.8 10*3/uL (ref 1.6–6.0)
Nucl RBC # K/uL: 0 /100 WBC (ref 0.0–0.2)
Platelets: 306 10*3/uL (ref 182–369)
RBC Distribution Width-SD: 45.2 fL (ref 36.4–46.3)
RBC: 4.45 10*6/uL (ref 3.93–5.22)
RDW: 13.4 % (ref 11.7–14.4)
Seg Neut %: 61.4 % (ref 34.0–71.1)
WBC: 9.4 10*3/uL (ref 4.0–10.0)

## 2017-07-13 LAB — INR FFT: INR: 1.04 (ref 0.88–1.17)

## 2017-07-13 LAB — APTT: aPTT: 33.2 seconds (ref 23.5–34.9)

## 2017-08-18 ENCOUNTER — Encounter: Payer: Self-pay | Admitting: Gastroenterology

## 2017-08-21 ENCOUNTER — Telehealth: Payer: Self-pay

## 2017-08-21 NOTE — Telephone Encounter (Signed)
OMFS--Writer attempted to contact patient to schedule consultation appt

## 2017-08-25 NOTE — Procedures (Signed)
[facility]                                [address]                              [place], [place]  [id number]                                  [phone]    Alberteen Spindle: Hughes, Casey I                         MR#:  [id number]  DOB:     1991/07/05                                AGE:  26  FAMILY PHYSICIAN:  PHILIP Rosita FireSCOTT MEAKER, MD         LOC:  [facility] 16                                 COLONOSCOPY REPORT    DATE OF SERVICE:  03/28/2017    PROCEDURE:  Colonoscopy.    INDICATION FOR PROCEDURE:  Rectal bleeding, diarrhea, elevated sedimentation  rate and CRP.    The patient was examined prior to the procedure.  The cardiac exam revealed  normal S1, S2 without S3 or murmurs.  The pulmonary exam revealed normal  breath sounds.    The patient was deemed a suitable candidate for colonoscopy.  The patient was  informed of the risks and benefits of the procedure.  The patient's questions  were answered to her satisfaction, and she elected to undergo colonoscopy.    The patient was deemed an ASA class II for conscious ambulatory sedation.    MEDICATIONS:  Versed 10 mg IV, fentanyl 150 mcg IV.    The Olympus variable-tension pediatric colonoscope was advanced to the cecum  without difficulty.  The cecum was identified by the presence of the  appendiceal orifice, the presence of the ileocecal valve and the presence of  transillumination of light in the right lower quadrant.  The scope was  withdrawn through the entire colon.  The prep was very good.  There was no  evidence of polyps, inflammatory mucosal changes, or diverticula.  Despite the  normal appearance of the colon, random biopsies were obtained throughout.  A J  maneuver was performed, and there were mild-to-moderate internal hemorrhoids.    IMPRESSION:  1. Colonoscopy to the cecum with no lesions noted.  Despite the normal     appearance of the colon, random biopsies were obtained throughout.  2. Internal hemorrhoids.  I feel  that this is the likely source of the     patient's bleeding.  We will start the patient on Benefiber, Activia     yogurt.  I am concerned about the patient's elevated sedimentation rate     and CRP and recommend a rheumatological evaluation given the elevated CRP     and sedimentation rate without any findings noted on today's exam.  The     patient will follow up in 6 weeks' time.  We will start the patient on  Benefiber and Activia yogurt.  A trial of Bentyl may be indicated.  A     barium esophagram and upper GI may also be indicated.      ____________________________________  Dionne Ano, MD    RMT/MODL/[id number]//[id number]/DD: 03/28/2017 10:36:01/DT: 03/28/2017 10:45:47    CC: Terrilee Croak, MD    Electronically Authenticated by:  Chrissie Noa, MD on 03/29/2017 07:33 AM EDT

## 2017-09-08 ENCOUNTER — Encounter: Payer: Self-pay | Admitting: Family Medicine

## 2017-09-08 ENCOUNTER — Ambulatory Visit
Admission: AD | Admit: 2017-09-08 | Discharge: 2017-09-08 | Disposition: A | Discharge status: Home / Self Care | Payer: Medicaid Other | Source: Ambulatory Visit | Attending: Family Medicine | Admitting: Family Medicine

## 2017-09-08 DIAGNOSIS — F17210 Nicotine dependence, cigarettes, uncomplicated: Secondary | ICD-10-CM | POA: Insufficient documentation

## 2017-09-08 DIAGNOSIS — A084 Viral intestinal infection, unspecified: Secondary | ICD-10-CM | POA: Insufficient documentation

## 2017-09-08 NOTE — UC Provider Note (Signed)
History     Chief Complaint   Patient presents with    Fever     along with nausea     X 4 days with fatigue: yesterday started with fever, today feels dizzy and fever with nausea with upper abd pain. Diarrhea x 4 yesterday and once today. Pt got the flu shot this year. Has had only water today and 1 can of diet coke, no food.             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    Renal calculi N20.0    SVT (supraventricular tachycardia) I47.1    Fibromyalgia M79.7    Positive ANA (antinuclear antibody) R76.8            Past Surgical History:   Procedure Laterality Date    APPENDECTOMY      BUNIONECTOMY      COLONOSCOPY      lithotripsy       Family History   Problem Relation Age of Onset    Anxiety disorder Mother     Arthritis Mother     Cancer Mother     Depression Mother     Emphysema Mother     Arthritis Father           Social History   Substance Use Topics    Smoking status: Current Every Day Smoker     Packs/day: 0.50     Types: Cigarettes    Smokeless tobacco: Never Used    Alcohol use Yes      Comment: Rare - 1 or 2 a month     Living Situation     Questions Responses    Patient lives with Family    Homeless No    Caregiver for other family member     External Services     Employment     Domestic Violence Risk                 Review of Systems   Review of Systems   Constitutional: Positive for fatigue and fever.   HENT: Negative for congestion.    Respiratory: Negative for cough.    Gastrointestinal: Positive for abdominal pain, diarrhea and nausea. Negative for vomiting.        Physical Exam   Triage Vitals  Triage Start: Start, (09/08/17 1806)   First Recorded BP: 134/73, Resp: 16, Temp: 36.8 C (98.2 F), Temp src: TEMPORAL Oxygen Therapy SpO2: 96 %, Oximetry Source: Rt Hand, O2 Device: None (Room air),   Heart Rate (via Pulse Ox): 70, (09/08/17 1809).      Physical Exam   Constitutional: She is oriented to person, place, and time. She appears well-developed and well-nourished.   Looks well   Cardiovascular: Normal rate and regular rhythm.    Abdominal: Soft. Bowel sounds are normal. She exhibits no mass. There is tenderness in the right upper quadrant. There  is positive Murphy's sign. There is no rebound and no guarding.   Neurological: She is oriented to person, place, and time.   Skin: Skin is warm and dry.   Has 3 new piercing's on anterior chest over sternum: slight erythema, no signs of infection.   Psychiatric: She has a normal mood and affect. Her behavior is normal. Judgment and thought content normal.   Nursing note and vitals reviewed.       Medical Decision Making    pt states she had an ultrasound a few months ago to assess gallbladder and it was negative.    Initial Evaluation:  ED First Provider Contact     Date/Time Event User Comments    09/08/17 1758 ED First Provider Manuella Ghazi, Shreshta Medley Initial Face to Face Provider Contact          Patient was seen on: 09/08/2017        Assessment:  27 y.o.female [facility] with X 4 days with fatigue: yesterday started with fever, today feels dizzy and fever with nausea with upper abd pain. Diarrhea x 4 yesterday and once today. Pt got the flu shot this year. Has had only water today and 1 can of diet coke, no food.             Differential Diagnosis includes:  Gastroenteritis  abd pain.    Plan:   F/u with pcp if not better.  No smoking.  Drink plenty of fluids, avoid caffeine.  Avoid any fatty foods. Keep the diet light.  If you have more symptoms or they get worse, need to go to the emergency  department or follow up with pcp.         Final Diagnosis  Final diagnoses:   [A08.4] Viral gastroenteritis (Primary)         Osamu Olguin Blanchie Dessert, NP       I have reviewed nursing documentation, confirmed information with patient, and revised with nursing as necessary.       Marylen Ponto, NP  09/08/17 [phone]

## 2017-09-08 NOTE — Discharge Instructions (Signed)
Drink plenty of fluids, avoid caffeine.  Avoid any fatty foods. Keep the diet light.  If you have more symptoms or they get worse, need to go to the emergency department or follow up with pcp.

## 2017-09-27 ENCOUNTER — Other Ambulatory Visit
Admission: RE | Admit: 2017-09-27 | Discharge: 2017-09-27 | Disposition: A | Discharge status: Home / Self Care | Payer: Medicaid Other | Source: Ambulatory Visit | Attending: Pediatrics | Admitting: Pediatrics

## 2017-09-27 DIAGNOSIS — N760 Acute vaginitis: Secondary | ICD-10-CM | POA: Insufficient documentation

## 2017-09-27 DIAGNOSIS — R300 Dysuria: Secondary | ICD-10-CM | POA: Insufficient documentation

## 2017-09-27 LAB — URINALYSIS WITH MICROSCOPIC
Ketones, UA: NEGATIVE
Leuk Esterase,UA: NEGATIVE
Nitrite,UA: NEGATIVE
Protein,UA: NEGATIVE mg/dL
RBC,UA: 1 /hpf (ref 0–2)
Specific Gravity,UA: 1.018 (ref 1.002–1.030)
WBC,UA: 1 /hpf (ref 0–5)
pH,UA: 6 (ref 5.0–8.0)

## 2017-09-28 LAB — CHLAMYDIA PLASMID DNA AMPLIFICATION

## 2017-09-28 LAB — AEROBIC CULTURE: Aerobic Culture: 0

## 2017-09-29 LAB — VAGINITIS SCREEN: DNA PROBE: Vaginitis Screen:DNA Probe: POSITIVE — AB

## 2017-10-02 ENCOUNTER — Ambulatory Visit
Payer: Medicaid Other | Attending: Pediatrics | Admitting: Student in an Organized Health Care Education/Training Program

## 2017-10-02 ENCOUNTER — Encounter: Payer: Self-pay | Admitting: Oral and Maxillofacial Surgery

## 2017-10-02 VITALS — BP 123/87 | HR 88 | Ht 65.0 in | Wt 260.0 lb

## 2017-10-02 DIAGNOSIS — M26629 Arthralgia of temporomandibular joint, unspecified side: Secondary | ICD-10-CM
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
## 2017-10-02 NOTE — Patient Instructions (Addendum)
Jaw Exercises and Wound Care Following TMJ surgery    Jaw Exercises  The below exercises will help to increase range of motion, restore or improve function and reduce long-term soreness after TMJ surgery.  Basic Jaw Exercises  1. Roll tongue as far back as possible on roof of mouth and then open only as wide as you can without removing your tongue from the roof of your mouth. [20 repetitions, 4 times a day]  2. Bite teeth together so most or all of your teeth are meeting then slide lower jaw side-to-side so all your teeth are lining up edge-to-edge on side that you are sliding to. Hold slide for a count of 3 seconds before sliding to the opposite side [20 repetitions per side, 4 times a day]           3. Slide lower jaw forward until your front teeth and edge-to-edge [20 repetitions, 4 times a day]  Advanced Jaw Exercises  These exercises will push your muscles to their limit, and it may be uncomfortable. Without range of motion exercises the likelihood of adhesions (scar bands in the joint), limited opening, and pain increase significantly. Push yourself beyond the discomfort to maximize your range of motion. If tolerated take 600-800 mg Motrin 1 hour prior to exercises.   1. Prybar - thumb on edge of top teeth and first finger on bottom front teeth. Pry open to a maximal opening until you are using significant force. [20 repetitions, 4 times a day]  2. Painting of the teeth - with the tip of your tongue touch the outside surfaces of all the upper teeth from the most back tooth on the right to the most back tooth on the left. Try to keep your tongue on the outside or cheek side of your teeth as much as possible [ 20 repetitions, 4 times a day]  REST**between sets for 1-2 minutes. ONLY do 4 sets per day   Use ice for 15-20 minutes following exercises to allow maximal healing and recovery.       Jaw Exercises and Wound Care Following TMJ surgery    Post-Operative Wound Care  1. Keep all facial incisions dry for the  first 48 hours following surgery. A water-proof bandage can be used while showering.   2. Apply Bacitracin/Neosporin to facial incisions, sutures, or puncture sites 3 times a day for 7 days  3. Eat very soft foods for at least 2 weeks following surgery (i.e. soups, oatmeal, mashed potatoes, peas, ice cream, yogurt, pudding, scrambled eggs, protein shakes, etc.)  4. Avoid strenuous activity (HR>120) or heavy lifting (>15 lbs) at least for 2 weeks  5. Do not chew gum, bite nails, or move your jaw excessively  Prescriptions  1. Re-start all home medications as previously prescribed  2. Use Ibuprofen (Motrin) or any Non-Steroidal Anti-Inflammatory (NSAID) medication regularly as prescribed for the first two weeks following a procedure   3. Muscle relaxants (Flexeril) may be prescribed to reduce muscle spasms and muscle pain        Plan:   Conservative Tx at this time:  - Fabrication of well-fitted hard acrylic full upper arch nightguard by dentist  - Stress reduction: Refrain from chewing hard, sticky food and gum  - Hot compress and massage  - Possible pharmacotherapy: NSAID (Naproxen), muscle relaxant  - Possible PT and acupuncture

## 2017-10-19 ENCOUNTER — Other Ambulatory Visit
Admission: RE | Admit: 2017-10-19 | Discharge: 2017-10-19 | Disposition: A | Discharge status: Home / Self Care | Payer: Medicaid Other | Source: Ambulatory Visit | Attending: Pediatrics | Admitting: Pediatrics

## 2017-10-19 DIAGNOSIS — N760 Acute vaginitis: Secondary | ICD-10-CM | POA: Insufficient documentation

## 2017-10-20 LAB — VAGINITIS SCREEN: DNA PROBE: Vaginitis Screen:DNA Probe: POSITIVE — AB

## 2017-11-11 ENCOUNTER — Emergency Department
Admission: EM | Admit: 2017-11-11 | Discharge: 2017-11-11 | Disposition: A | Discharge status: Home / Self Care | Payer: Medicaid Other | Source: Ambulatory Visit | Attending: Emergency Medicine | Admitting: Emergency Medicine

## 2017-11-11 DIAGNOSIS — G43109 Migraine with aura, not intractable, without status migrainosus: Secondary | ICD-10-CM | POA: Insufficient documentation

## 2017-11-11 DIAGNOSIS — G43019 Migraine without aura, intractable, without status migrainosus: Secondary | ICD-10-CM

## 2017-11-11 HISTORY — DX: Migraine, unspecified, not intractable, without status migrainosus: G43.909

## 2017-11-11 MED ORDER — MAGNESIUM SULFATE 2 GM IN 50 ML *WRAPPED*
2000.0000 mg | Freq: Once | INTRAVENOUS | Status: AC
Start: 2017-11-11 — End: 2017-11-11
  Administered 2017-11-11: 2000 mg via INTRAVENOUS
  Filled 2017-11-11: qty 50

## 2017-11-11 MED ORDER — KETOROLAC TROMETHAMINE 15 MG/ML IJ SOLN *I*
15.0000 mg | Freq: Once | INTRAMUSCULAR | Status: AC
Start: 2017-11-11 — End: 2017-11-11
  Administered 2017-11-11: 15 mg via INTRAVENOUS
  Filled 2017-11-11: qty 1

## 2017-11-11 MED ORDER — DEXAMETHASONE SOD PHOSPHATE PF 10 MG/ML IJ SOLN *I*
6.0000 mg | Freq: Once | INTRAMUSCULAR | Status: AC
Start: 2017-11-11 — End: 2017-11-11
  Administered 2017-11-11: 6 mg via INTRAVENOUS
  Filled 2017-11-11: qty 1

## 2017-11-11 MED ORDER — METOCLOPRAMIDE HCL 5 MG/ML IJ SOLN *I*
10.0000 mg | Freq: Once | INTRAMUSCULAR | Status: AC
Start: 2017-11-11 — End: 2017-11-11
  Administered 2017-11-11: 10 mg via INTRAVENOUS
  Filled 2017-11-11: qty 2

## 2017-11-11 NOTE — Discharge Instructions (Signed)
Keep well-hydrated.  Take Tylenol 1000 mg and Advil 400 mg every 6 hours as needed for mild to moderate headache.  Get sufficient rest.  Follow-up with an oral surgeon for symptomatic right TMJ syndrome.  Follow-up with your primary care physician for further evaluation of frequent migraine headaches.

## 2017-11-11 NOTE — ED Notes (Signed)
Peripheral iv in LAC inserted by this writer prior to med administration, removed, cath intact

## 2017-11-11 NOTE — ED Notes (Signed)
Bed: TED-11  Expected date:   Expected time:   Means of arrival:   Comments:  RA
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
## 2017-11-11 NOTE — ED Triage Notes (Signed)
Pt states she has a migraine and is having pain "with my TMJ" states this started this am and took ibuprofen for pain at 10 this am.        Triage Note   Laurell JosephsMelissa Raye Slyter, RN

## 2017-12-01 ENCOUNTER — Other Ambulatory Visit
Admission: RE | Admit: 2017-12-01 | Discharge: 2017-12-01 | Disposition: A | Discharge status: Home / Self Care | Payer: Medicaid Other | Source: Ambulatory Visit

## 2017-12-01 DIAGNOSIS — Z01419 Encounter for gynecological examination (general) (routine) without abnormal findings: Secondary | ICD-10-CM | POA: Insufficient documentation

## 2017-12-01 LAB — N. GONORRHOEAE DNA AMPLIFICATION: N. gonorrhoeae DNA Amplification: 0

## 2017-12-01 LAB — CHLAMYDIA PLASMID DNA AMPLIFICATION: Chlamydia Plasmid DNA Amplification: 0

## 2017-12-04 LAB — CHLAMYDIA PLASMID DNA AMPLIFICATION: Chlamydia plasmid DNA AMplification: 0

## 2017-12-11 LAB — GYN CYTOLOGY

## 2018-01-04 ENCOUNTER — Ambulatory Visit
Admission: AD | Admit: 2018-01-04 | Discharge: 2018-01-04 | Disposition: A | Discharge status: Home / Self Care | Payer: Medicaid Other | Source: Ambulatory Visit | Attending: Family | Admitting: Family

## 2018-01-04 DIAGNOSIS — J329 Chronic sinusitis, unspecified: Secondary | ICD-10-CM

## 2018-01-04 DIAGNOSIS — H6092 Unspecified otitis externa, left ear: Secondary | ICD-10-CM | POA: Insufficient documentation

## 2018-01-04 DIAGNOSIS — F17210 Nicotine dependence, cigarettes, uncomplicated: Secondary | ICD-10-CM | POA: Insufficient documentation

## 2018-01-04 MED ORDER — NEOMYCIN-POLYMYXIN-HC 3.5-10000-1 OT SOLN *I*
3.0000 [drp] | Freq: Four times a day (QID) | OTIC | 0 refills | Status: AC
Start: 2018-01-04 — End: 2018-01-11

## 2018-01-04 MED ORDER — AMOXICILLIN-POT CLAVULANATE 875-125 MG PO TABS *I*
1.0000 | ORAL_TABLET | Freq: Two times a day (BID) | ORAL | 0 refills | Status: AC
Start: 2018-01-04 — End: 2018-01-11

## 2018-01-04 NOTE — UC Provider Note (Addendum)
History     Chief Complaint   Patient presents with    Ear Problem     left, pain increasing throughout the day     This is a 27 year old female who presents to urgent care with one day of severe left ear pain and fevers.  Patient states that she has been ill for the past week with a cough, congestion and runny nose.  She was seen by her PCP and diagnosed with a viral URI with cough.  Today she developed a temperature of 99.9 Fahrenheit and left ear pain.  She denies having drainage, difficulties with hearing, headaches or dizziness.  The patient is a daily smoker one half PPD 1 year.  Patient stated that she likes the taste of cigarettes and does not have an interest in quitting.        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    Renal calculi N20.0    SVT (supraventricular tachycardia) I47.1    Fibromyalgia M79.7    Positive ANA (antinuclear antibody) R76.8            Past Surgical History:   Procedure Laterality Date    APPENDECTOMY      BUNIONECTOMY      COLONOSCOPY      lithotripsy       Family History   Problem Relation Age of Onset    Anxiety disorder Mother     Arthritis Mother     Cancer Mother     Depression Mother     Emphysema Mother     Arthritis Father           Social History   Substance Use Topics    Smoking status: Current Every Day Smoker     Packs/day: 0.50     Types: Cigarettes    Smokeless tobacco: Never Used    Alcohol use Yes      Comment: Rare - 1 or 2 a month     Living Situation      Questions Responses    Patient lives with Family    Homeless No    Caregiver for other family member     External Services     Employment     Domestic Violence Risk                 Review of Systems   Review of Systems   Constitutional: Positive for chills. Negative for fatigue and fever.   HENT: Positive for congestion, ear pain (left), rhinorrhea, sinus pain and sinus pressure. Negative for ear discharge, sore throat and trouble swallowing.    Respiratory: Positive for cough. Negative for shortness of breath and wheezing.    Cardiovascular: Negative for chest pain.   Allergic/Immunologic: Negative for immunocompromised state.   Neurological: Negative  for dizziness, weakness and light-headedness.       Physical Exam   Triage Vitals  Triage Start: Start, (01/04/18 1807)   First Recorded BP: 118/70, Resp: 18, Temp: 36.4 C (97.6 F), Temp src: TEMPORAL Oxygen Therapy Oximetry Source: Rt Hand,   Heart Rate (via Pulse Ox): 74, (01/04/18 1809).  First Pain Reported  0-10 Scale: 10, Pain Location/Orientation: Ear Left, (01/04/18 1809)       Physical Exam   Constitutional: She is oriented to person, place, and time. She appears well-developed and well-nourished.   HENT:   Right Ear: Tympanic membrane normal.   Left Ear: There is swelling (ear canal erythematous and swollen) and tenderness.   Nose: Mucosal edema and rhinorrhea present. Right sinus exhibits frontal sinus tenderness. Left sinus exhibits frontal sinus tenderness.   Mouth/Throat: Uvula is midline. Posterior oropharyngeal erythema present. Tonsils are 2+ on the right. Tonsils are 2+ on the left. No tonsillar exudate.   Left TM is obscured with cerumen   Neck: Normal range of motion. Neck supple.   Cardiovascular: Normal rate, regular rhythm and normal heart sounds.    Pulmonary/Chest: Effort normal and breath sounds normal. She has no wheezes.   Abdominal: Soft. Bowel sounds are normal.   Lymphadenopathy:     She has no cervical adenopathy.   Neurological: She  is alert and oriented to person, place, and time.   Skin: Skin is warm and dry.   Psychiatric: She has a normal mood and affect. Her behavior is normal.   Nursing note and vitals reviewed.       Medical Decision Making        Initial Evaluation:  ED First Provider Contact     Date/Time Event User Comments    01/04/18 1811 ED First Provider Contact Breslyn Abdo, [facility] Initial Face to Face Provider Contact          Patient was seen on: 01/04/2018        Assessment:  27 y.o.female comes to the Urgent Care Center with 1 week of cough, congestion and runny nose and 1 day of acute severe left ear pain and fever    Differential Diagnosis includes:    Otitis Media  Serous Otitis Media  Allergic Otitis Media  URI  Foreign Body  Cerumen Impaction  Eustachian Tube Dysfunction  Sinusitis      Plan:Patient will be started on 7 days of neomycin polymyxin for left otitis externa.  I have also prescribed 7 days of Augmentin to treat a sinus infection. I instructed her to take all of the antibiotics, even if symptoms improve. Motrin alternating with Tylenol as needed for pain. Warm compresses over the affected ear.  I advised her to keep her ear canal dry and time, and to avoid sticking anything inside of her ears including Q-tips.  Follow up with PCP if pain worsens, or if she develops drainage from the affected ear, or other concerns.        Discharge Medication List as of 01/04/2018  7:06 PM      START taking these medications    Details   neomycin-polymyxin-hydrocortisone (NEOMYCIN-POLYMYXIN-HC) otic solution Place 3 drops into the left ear 4 times dailyDisp-10 mL, R-0, NormalEmergency Encounter      amoxicillin-clavulanate (AUGMENTIN) 875-125 MG per tablet Take 1 tablet by mouth 2 times daily for 7 daysDisp-14 tablet, R-0, NormalEmergency Encounter                Final Diagnosis  Final diagnoses:   [  H60.92] Otitis externa of left ear, unspecified chronicity, unspecified type (Primary)   [J32.9] Sinusitis, unspecified chronicity,  unspecified location         Tanda RockersKaydean Liddie Chichester, NP       I have reviewed nursing documentation, confirmed information with patient, and revised with nursing as necessary.       Tanda RockersHarris, Achol Azpeitia, NP  01/04/18 Aretha Parrot1937       Tanda RockersHarris, Faatima Tench, NP  01/05/18 1037

## 2018-01-04 NOTE — Discharge Instructions (Signed)
For pain/inflammation relief, can take 400-600 mg of ibuprofen every 6-8 hours.    Use ear drops as prescribed for 1 week. See directions for ear drops below:  Tip your head towards unaffected side, pull up on ear lobe and put drops into affected ear.  After putting in the drops, lie down with the ear you put the drops in facing up. Stay this way for 10 minutes.     Avoid swimming or submerging your head underwater until symptoms improve.    Use an ear plug/ cotton ball in affected external ear during showering to prevent water from entering your ear canal. Do not push into ear canal.    Warm moist compresses over ear will help alleviate some discomfort. (Can use a wet washcloth warmed in the microwave or you can run the washcloth under warm water.)    Elevate head with an extra pillow at night to reduce discomfort of laying flat.    Do not put any object into your ears, including Q-tips.    Please seek medical attention if you develop any worsening pain, fevers, chills, hearing loss, increased drainage, or any unresolved symptoms.       Please purchase and utilize over-the-counter Claritin or Zyrtec once daily.    You may use warm compresses (i.e. Washcloth warmed up with hot water, 10 minutes at a time) on bilateral sinuses 4-5 times daily for symptom relief.    Utilizing steam/humidified air from hot showers/steam baths may also help with sinus congestion symptoms.    Please utilize OTC nasal saline rinses or a netti pot once in the morning and once at night, to improve nasal congestion symptoms    Blow your nose, then use saline nasal spray to rinse bilaterally, blow your nose again, then use steroidal nasal spray (flonase).    Please continue to hydrate aggressively, and get plenty of rest.    You may use of over-the-counter Mucinex DM 600 mg-30 mg twice daily x 5 days for mucus/congestion/cough relief.    You may also purchase and utilize OTC Vicks vapor rub to apply to your chest as directed for cough  relief.

## 2018-01-05 ENCOUNTER — Encounter: Payer: Self-pay | Admitting: Family

## 2018-02-03 ENCOUNTER — Emergency Department
Admission: EM | Admit: 2018-02-03 | Discharge: 2018-02-03 | Disposition: A | Discharge status: Home / Self Care | Payer: Medicaid Other | Source: Ambulatory Visit | Attending: Psychiatry | Admitting: Psychiatry

## 2018-02-03 DIAGNOSIS — R45851 Suicidal ideations: Secondary | ICD-10-CM

## 2018-02-03 DIAGNOSIS — F331 Major depressive disorder, recurrent, moderate: Secondary | ICD-10-CM | POA: Insufficient documentation

## 2018-02-03 DIAGNOSIS — F603 Borderline personality disorder: Secondary | ICD-10-CM | POA: Insufficient documentation

## 2018-02-03 DIAGNOSIS — F4323 Adjustment disorder with mixed anxiety and depressed mood: Secondary | ICD-10-CM | POA: Insufficient documentation

## 2018-02-03 HISTORY — DX: Unspecified convulsions: R56.9

## 2018-02-03 NOTE — ED Triage Notes (Signed)
Pt to ed with + SI, denies HI, denies etoh/ illegal drug consumption   Pt is here voluntarily, has minor superficial scrapes to left upper extremity   Pt is here voluntarily       Triage Note   [name], RN

## 2018-02-03 NOTE — CPEP Notes (Signed)
CPEP Triage Note    Patient is oriented to unit and CPEP evaluation process: Yes  Reviewed cell phone policy: yes   Reviewed contraband items/milieu safety concerns and confirmed no safety concerns present: yes    Chief Complaint:     Chief Complaint   Patient presents with    Suicidal   .  Patient presented voluntarily, accompanied by friends with the above   chief complaint.    Patient with MHA: NA    History:     Reason for current presentation: Pt is a 27 yr old female who presents to CPEP, voluntarily re: SI. During Triage, Pt tearful, anxious and reports Suicidal Ideations r/t a recent break-up with her boyfriend 3 days ago. Pt reports superficially cutting her left wrist yesterday (1st time in 6 yrs per pt). Pt states that she is unable to focus on anything else in her life besides her break-up, it's affecting her sleep pattern, she's been non compliant with prescribed medications for 2 days except Klonopin 1 tab last night to help her sleep. Pt states "I don't know" when questioned about current SI. [facility] Durenda Guthrie([facility]). Denies any illicit substance usage.          Substance use: denies     Ingestion: Denies    Self-harm: yes: superficially cut left wrist    Social History     reports that she has been smoking Cigarettes.  She has a 5.00 pack-year smoking history. She has never used smokeless tobacco. She reports that she does not drink alcohol or use drugs.  Medication Reconciliation     Medication data collection:  Meds reviewed with patient during triage: yes    Physical Assessment   Review of Systems   Psychiatric/Behavioral: Positive for self-injury and suicidal ideas. The patient is nervous/anxious.      Pain assessment: Last Nursing documented pain:  0-10 Scale: 0 (02/03/18 1423)    Last Filed Vitals    02/03/18 1423   BP: 130/80   Pulse: 104   Resp: 18   Temp: 36.2 C (97.2 F)   SpO2: 100%       Medical/Surgical History:     PMH:   Past Medical  History:   Diagnosis Date    Anxiety     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    Depression     Fibromyalgia     Hypothyroid     Migraines     Seizures        PSH:   Past Surgical History:   Procedure Laterality Date    APPENDECTOMY      BUNIONECTOMY      COLONOSCOPY      lithotripsy           Merrik Puebla, RN, 2:53 PM
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
## 2018-02-03 NOTE — ED Notes (Signed)
02/03/18 1739   CPEP Summary of Services   Safety precautions implemented personal medications removed and secured;security metal scanning completed;personal belongings secured   Case consultation/discussion involving attending;registered nurse;social worker   Crisis intervention and safety planning involving verbal intervention;safety plan #1 developed (comment);access to firearms discussed and denied   Collateral information obtained from friend   Referral offered, accepted and completed for  [facility] services   Referral(s) offered but declined by patient for No referrals declined

## 2018-02-03 NOTE — CPEP Notes (Signed)
CPEP Charge Nurse Note    Report received from: Ivan Anchorsennis RN, ambulatory, voluntarily accompanied by patient alone.    

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
## 2018-02-03 NOTE — ED Notes (Signed)
02/03/18 1739   Disposition details   Patient is being discharged to community   Family notification done in person   Name of family member notified Haskell RilingCorey Lawson (roommate)   Outpatient provider notification of disposition via phone message   Name of outpatient provider Dr. Isaiah BlakesMeaker   Transportation arranged via friend   Transporting friend name Haskell RilingCorey Lawson    Patient belongings  given to patient   Medications NA- patient has none   Valuables NA- patient has none

## 2018-02-03 NOTE — CPEP Notes (Signed)
adult partial hospitalization program  [address] [place], [place] [address]  Phone [phone] Fax [phone]    REFERRAL FORM   Form to be used by CPEP/EOB/MCT/CL only   Please print clearly or type    [facility]:  [id number]    Best phone number to reach patient:  [phone]         INSURANCE:   [place] [employer], Blue Choice Option                CLINICAL DATA:  (All boxes need to be checked)    [x]  Yes Patient has a primary mental health diagnosis that reflects a need for PHP level of care  Excluded Diagnoses include Adjustment Disorder only if Medicare recipient, Primary Substance Use Disorder, Neurocognitive Disorder (dementia), or Primary Neurodevelopmental Disorder, IQ under 70     [x]  Yes Patient is receptive to a group based treatment program    [x]  Yes Patient is aware program requires daily participation from 9:30-3:15 and has no barriers to participation, such as: child care, transportation, housing, or other    Reason for PHP referral: Patient would like to improve coping and emotion regulation skills    Goal for PHP admission: Improve self care, emotion regulation, and distress tolerance skills    Alcohol Use:  []  Yes     [x]  No Level and significance to treatment: N/A    Drug Use:      []  Yes     [x]  No Level and significance to treatment: N/A      REFERRING PERSON:  Seabron SpatesNaila Darcel Zick, LMSW  Phone #: [phone]    [x]  CPEP []  EOB []  MCT []  CL    ED Intake appointment slot the patient has been given  Date: 02-14-18  Time: 1:00pm    If patient needs an INTERPRETER, please DO NOT give an appointment from the ED slot schedule. Use the following process:   Submit this referral form to PHP   Indicate language needed: []  Spanish  []  Sign  []  Other:     Inform the patient that the PHP intake coordinator will call them with an appointment as soon as an interpreter can be scheduled.   Provide patient with the PHP number to call if they have not been contacted in two  business days.     Fax Referral to Sharl MaBrenda Swartz, [phone]

## 2018-02-03 NOTE — Discharge Instructions (Signed)
CPEP Discharge Instructions    Discharge Date: 02/03/2018    Discharge Time:  5:45pm     Follow-ups:  Appointment With: Durenda GuthrieKirk Palmer      Phone: [phone]   Date: 02/07/18  Time: 4:00 pm   Location/Instructions:    Appointment With: Partial Hospitalization Program   Phone: [phone]  Date: 02-14-18  Time: 1:00pm   Location/Instructions:  [address]  [place] [id number]        Level of outreach indicated if patient fails new intake or COPS (Comprehensive Outpatient Psychiatric Service) appointment: Routine Program Follow-up    When to call for help:    Call your psychiatric outpatient provider if experiencing any of these symptoms: increased irritability, sleep changes, appetite changes, energy changes, thoughts to harm yourself or others, anxiety, fear, auditory or visual hallucinations.  [facility] Helpline (24 hours/7 days) [phone] Consulting civil engineer(TTY)  Mobile Crisis team: [phone]    General Instructions:  Other written information given to the patient: No  Return to Work/School on: 02-05-18          The above information has been discussed with me and I have received a copy.  I understand that I am advised to follow the instructions given to me to appropriately care for my condition.

## 2018-02-07 ENCOUNTER — Ambulatory Visit: Payer: Self-pay | Admitting: Psychiatry

## 2018-02-09 ENCOUNTER — Ambulatory Visit: Payer: Medicaid Other | Attending: Psychiatry | Admitting: Psychiatry

## 2018-02-09 DIAGNOSIS — F4310 Post-traumatic stress disorder, unspecified: Secondary | ICD-10-CM

## 2018-02-09 DIAGNOSIS — F331 Major depressive disorder, recurrent, moderate: Secondary | ICD-10-CM

## 2018-02-09 DIAGNOSIS — F1490 Cocaine use, unspecified, uncomplicated: Secondary | ICD-10-CM | POA: Insufficient documentation

## 2018-02-09 DIAGNOSIS — F603 Borderline personality disorder: Secondary | ICD-10-CM | POA: Insufficient documentation

## 2018-02-09 DIAGNOSIS — F410 Panic disorder [episodic paroxysmal anxiety] without agoraphobia: Secondary | ICD-10-CM | POA: Insufficient documentation

## 2018-02-09 DIAGNOSIS — F411 Generalized anxiety disorder: Secondary | ICD-10-CM

## 2018-02-09 DIAGNOSIS — Z79899 Other long term (current) drug therapy: Secondary | ICD-10-CM | POA: Insufficient documentation

## 2018-02-09 HISTORY — DX: Major depressive disorder, recurrent, moderate: F33.1
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
## 2018-02-09 NOTE — Progress Notes (Signed)
Adult Partial Hospitalization Program  Clinical Management Note   Name: Casey Hughes  MRN: [id number]  DOB: 11/19/1990    02/09/2018    Pt was to late to the intake to be seen, she was rescheduled for Friday 02/09/18 at 10:30 fee interview and intake at 11 am. Patient denied suicidal ideation.

## 2018-02-09 NOTE — BH Treatment Plan (Signed)
Adult Partial Hospitalization Program   Interdisciplinary Treatment Plan      Created/Updated On 02/09/2018   FROM 02/12/2018     Created/Updated On 02/09/2018   Next Treatment Plan Due 02/26/2018       DIAGNOSTIC IMPRESSION  Diagnoses   Code Name Primary?    F33.1 Major depressive disorder, recurrent, moderate Yes    F41.1, F41.0 Generalized anxiety disorder with panic attacks     F43.10 Post traumatic stress disorder     F60.3 Borderline personality disorder        Strengths  Refer to [facility] Intake Assessment    PROBLEM AREAS:    Depression    1. Individualized Manifestations:  Casey Hughes has experienced decreased appetite, decreased daily functioning, feelings of worthlessness, poor concentration and substantial depressed/sad/low/empty mood in the 4 weeks prior to admission.  Casey Hughes has experienced anxiety symptoms associated with the current depressive episode including difficulty concentrating, excessive anxiety and worry.    2. Goal:  To decrease the impact of depressive symptoms on daily functioning.  Goal individualized as evidenced by the following statement in Casey Hughes words: learn coping skills.    3. Objectives:  Casey Hughes will identify, discuss and problem-solve1-2 triggers for episodes of sad, low, empty mood.  Casey Hughes will learn and practice 1-2 new coping skills to reduce episodes of sad, low, empty mood symptoms for at least 5 days.  Casey Hughes will monitor and self-report positive characteristics or strengths, accept compliments, identify and verbally express needs for at least 3 days.    4. Criteria for Discharge:  When Casey Hughes reports an increased ability to manage depressive symptoms through improved effectiveness of healthy coping skills.   Criteria for discharge individualized as evidenced by statement in Casey Hughes words: I will feel that this goal has been accomplished when I feeel I can cope better with my depression and anxiety              5.  ANTICIPATED DATE OF  ACHIEVEMENT:    Created/Updated On 02/09/2018   Next Treatment Plan Due 02/26/2018      Trauma Related Disorders  Deferred at [place] time      Safety - Self-Injury, Suicidal Thoughts, Suicide Attempt, Physical Aggression, Violent Thoughts, and/or Homicidal Ideation    1. Individualized Manifestations:  Casey Hughes has been engaging in self-injury behavior by cutting during the one week prior to admission.  Casey Hughes has experienced an increase in passive suicidal ideation during the one week prior to admission.    2. Goal:  To decrease Casey Hughes risk of harm to self and/or others and more effectively use her safety plan which includes use of healthy coping skills.  Goal individualized as evidenced by the following statement in Casey Hughes words: Learn to cope with my feelings better.    3. Objectives:  Casey Hughes will create an individual safety plan and will utilize the support options identified when experiencing suicidal thoughts and urges to self-injure.  Casey Hughes will discuss and problem solve 1-2 triggers to suicidal thoughts and urges to self-injure.  Casey Hughes will learn and practice at least 2 new coping skills and strategies and discuss them with therapist and in group.  Casey Hughes will experience a decrease in self-injury behavior from 2 to 3 times per week and will use of skills instead of self-injury for at least 7 consecutive days.    4. Criteria for Discharge:  Casey Hughes will have created a safety plan, refined it to minimize barriers to its effective use, and will feel  confident in using at least 1 healthy coping skill to prevent harm.  Criteria for discharge individualized as evidenced by statement in Casey Hughes words: I will feel that this goal has been accomplished when I can use coping skills instead of cutting myself or thinking about killing myself.    5. Anticipated Date of Achievement:   Created/Updated On 02/09/2018   Next Treatment Plan Due 02/26/2018       Treatment Modalities  Primary Therapist will provide  supportive psychotherapy 1 time per week or more as needed.    Initial psychopharmacology assessment by psychiatrist or psychiatric nurse practitioner, and follow-up by medication assessment team for prescribing and monitoring psychiatric medications.    [facility] General group leaders will provide 5 hours of group therapy 5 times per week.    Nurse Clinic will provide a health screen to ensure no concurrent medical condition is contributing to the clinical picture within Casey Hughes's first week in the PHP.  Should a medical condition be found or suspected, Nurse will refer Casey Hughes to the primary care doctor.     Social Worker will provide referral/linkage to aftercare providers and/or assistance with psychosocial needs.    At least 1 contact by treatment team with [name] family/friend support during [name]'s PHP admission if Casey Hughes is willing. Family/friend Support: mother Casey Hughes([name] is willing for team to contact).    Casey Hughes and her family will be offered opportunity for psychoeducation in weekly multi-family/friends group.    Need for securing lethal means not identified.    Discharge Plan After Partial Hospital Care   Type 02/07/2018     Therapist     Name 02/07/2018     Durenda GuthrieKirk Hughes     Living Situation: Casey StandardAllison lives (with) roommate  Financial Support: Employed part-time  Higher education careers adviseratient/Family and Staff Participating in CounsellorDevelopment of Treatment Plan:  Coca ColaDenise Amalia Hughes, [address] Mchen[address] StCSW-R, [place]Heidi Hakes, LCSW, [place], LMSW, [place]leen Hughes, [place]CBSW, [place] Vol[place], LCSW, CGP, Casey Hughes, [facility], CASAC, [place]Michelle LaRussa-Trott, LCSW-R, [place]da [place], BSW, [place]ole Hughes, [place]LMSW, Humphrey RollsSue DiGiovanni, MD, Casey Hughes, LMSW, Casey HerrlichJennifer Cole, RN, Salley ScarletAdam Cake, LCSW, Casey LimesVilma Vendrell, LCSW and Dellis AnesBryan Burroughs, NP, Casey ItoAllison I Hughes

## 2018-02-12 ENCOUNTER — Ambulatory Visit: Payer: Medicaid Other
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
## 2018-02-12 NOTE — Progress Notes (Signed)
Adult Partial Hospitalization Program  Clinical Management Note     Patient Name: Casey Hughes  MRN: [id number]  DOB: 09/18/1990    Date: 02/12/2018    [facility] informed writer of pt's authorization for 10 days of PHP obtained in Clear Cover[employer] system. Authorization number is [id number].

## 2018-02-13 ENCOUNTER — Ambulatory Visit: Payer: Medicaid Other

## 2018-02-13 VITALS — BP 117/77 | HR 98 | Resp 16 | Ht 65.0 in | Wt 243.0 lb

## 2018-02-13 DIAGNOSIS — Z79899 Other long term (current) drug therapy: Secondary | ICD-10-CM

## 2018-02-13 DIAGNOSIS — F410 Panic disorder [episodic paroxysmal anxiety] without agoraphobia: Secondary | ICD-10-CM

## 2018-02-13 DIAGNOSIS — F331 Major depressive disorder, recurrent, moderate: Secondary | ICD-10-CM

## 2018-02-13 DIAGNOSIS — F411 Generalized anxiety disorder: Secondary | ICD-10-CM

## 2018-02-13 DIAGNOSIS — F4310 Post-traumatic stress disorder, unspecified: Secondary | ICD-10-CM

## 2018-02-13 NOTE — Progress Notes (Signed)
Adult Partial Hospitalization Program  Clinical Management Note   Name: Lynn Itollison I Lehrmann  MRN: [id number]  DOB: 03/27/1991    02/13/2018     Left message for pt's PCP, Dr. Isaiah BlakesMeaker. Plan to obtain collateral and develop plan for treatment (re: med changes duloxetine).

## 2018-02-13 NOTE — Progress Notes (Signed)
Adult Partial Hospitalization Program Nursing Health Assessment      Date of Service: 02/13/2018  Length of session: 20 min    Patient Name: Casey Hughes  Patient Date of Birth: 05/31/1991  Patient Medical Record Number: [id number]    Health Screen  Height: 1.651 m (5\' 5" ) (02/13/2018 10:09 AM)  Weight: 110.2 kg (243 lb) (02/13/2018 10:09 AM)  BMI (Calculated): 40.5 (02/13/2018 10:09 AM)  BSA (Calculated - sq m): 2.25 sq meters (02/13/2018 10:09 AM)  IBW in kg (Calculated) : 57 (02/13/2018 10:09 AM)  BP: 117/77 (02/13/2018 10:09 AM)  Heart Rate: 98 (02/13/2018 10:09 AM)  Temp: 36.2 C (97.2 F) (02/03/2018  2:23 PM)  Resp: 16 (02/13/2018 10:09 AM)  SpO2: 100 % (02/03/2018  2:23 PM)   0-10 Scale: 0 (02/03/2018  2:23 PM)    Last Physical Exam:  Date of Last Physical Exam:  (>2 year; has frequent FUV)  Name of Primary Care Provider:  Wyline CopasMeaker, Philip S, MD    Medical History:  Past Medical History:   Diagnosis Date    Anxiety     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    Depression     Endometriosis 2011    Fibromyalgia     Hypothyroid     Migraines     Seizures     last 03/2017 in context abrupt withdrawl from medication    SVT (supraventricular tachycardia) 2013       Obstetrical/Gynecological:  Patient's last menstrual period was 02/12/2018.  Currently pregnant? : No           Recently pregnant?: No                    Menstrual Pain: Yes  Menstrual irregularities: Yes  Pre-menstrual symptoms: No    Surgical History:  Past Surgical History:   Procedure Laterality Date    APPENDECTOMY      BUNIONECTOMY      COLONOSCOPY      lithotripsy       + [place]        + Care Everywhere - Updates requested     Medications:  Medications/Vitamins/Supplements         Last Dose Start Date End Date Provider     clonazePAM (KLONOPIN) 0.5 MG tablet As Needed  --  --  [provider]     Take 0.5 mg by mouth 2 times daily as needed for Anxiety     cyclobenzaprine (FLEXERIL) 5 MG tablet As Needed  --  --   Meaker, Gwenlyn FudgePhilip S, MD     Take 5 mg by mouth 3 times daily as needed for Muscle spasms     DULoxetine (CYMBALTA) 60 MG capsule Taking  --  --  [provider]     Take 120 mg by mouth daily        fluticasone (FLONASE) 50 MCG/ACT nasal spray As Needed  --  --  [provider]     1 spray by Nasal route daily     hydrOXYzine HCl (ATARAX) 10 MG tablet Not Taking  --  --  [provider]     Take 10 mg by mouth 3 times daily as needed for Itching or Anxiety     levonorgestrel, KYLEENA, (KYLEENA) 19.5 MG IUD Taking  --  --  [provider]     1 each by Intrauterine route once     omeprazole (PRILOSEC) 20  MG capsule Taking  01/19/18  --  [provider]     Take 20 mg by mouth daily     traZODone (DESYREL) 50 MG tablet Taking  --  --  [provider]     Take 50 mg by mouth nightly          Allergies:  Allergy History as of 02/13/18     CIPRO XR       Noted Status Severity Type Reaction    02/16/10 1738 Jonna Coup, [place], NP 02/16/10 Active       Comments:  Bothered stomach                 Nutrition:  Estimated body mass index is 40.44 kg/m as calculated from the following:    Height as of this encounter: 1.651 m (5\' 5" ).    Weight as of this encounter: 110.2 kg (243 lb).  Weight loss or gain of 10 pounds or more in the past 3 months?: Yes  Increase or decrease in food intake and/or appetite?: Yes (decreased with n/v)  Patient attributes this change to: Other (comment) ("I don't know")  Eating habits or behaviors that may be indicators of an eating disorder, such as binging, restricting, or inducing vomiting?: No  Do you have any dental problems impacting your ability to eat?: No    Tobacco Use:  History   Smoking Status    Current Every Day Smoker    Packs/day: 1.00    Years: 5.00    Types: Cigarettes   Smokeless Tobacco    Never Used       Chronic Pain Assessment:  Do you have any ongoing pain problems?: Yes  Where is your pain located?: Neck, Hip, Back,  Shoulder (with FMA)  Where is the pain oriented?:  (generalized )  How often do you experience the pain?: Continuous (variable intensity)  What does the pain feel like?: Stabbing, Sharp, Tightness (stiff joints)  Pain Scale (0-10): 8  Pain is adequately controlled?: Yes ('kind of')    Fall Risk:  Have you fallen in the last year?: No        Do you feel you are at risk of falling?: No     Would you like to learn more about how to reduce your risk of falling?: No    Sleep:  Average total hours of sleep: 4 ("not enoug..like 30 min sometimes"  iniating/broken/nightmar)  Sleeping or napping during the day? : No  Any bedtime/sleep routine? : Yes (snuggle my dog & watch funny movie)    Follow-Up:    Up To Date Physical Exam:   Patient has not had a physical within the last year. Provided education on utility of regular physical exams in conjunction with behavioral health care and instructed to call PCP to schedule an appointment for physical and ongoing health maintenance.    Food Allergies:  Patient denies any known food allergies. No follow-up necessary.    Nutrition:  Patient has no nutritional concerns. No follow-up necessary.    Tobacco/Nicotine:   Patient uses tobacco/nicotine and was given smoking cessation counseling/resources including PCP, [employer], and [email or web address].    Pain:  Patient reports pain that is chronic in nature and follows with PCP; educated on non-pharmacological measures for mitigation of chronic pain while in program groups including position change, walking, warm/cold packs as tolerated.    Fall Risk:  Patient has no identified risk for falls. No follow-up necessary.  Sleep:  Patient describes sleep concerns that are likely a symptom of their primary mental health diagnosis. Treatment will focus on addressing the primary mental health diagnosis.    Physical Concerns:  Patient denies physical concerns. No follow-up necessary.    Need for Education:  Patient has no identified knowledge  deficit about medical problems/treatment needs at this time.    Barriers to accessing medical care:  Patient has no identified barriers to accessing medical care.    Other Identified Concerns:  No other identified concerns.     Updated Pharmacy [facility] - [place], [place] - [address]

## 2018-02-13 NOTE — Patient Instructions (Signed)
Switch Cymbalta (duloxetine) 60mg to 2 tabs at AM, not PM.  Continue Trazadone 50mg 1-2 tabs at bedtime.  Take Hydroxyzine 10mg, 2 tab as needed I-2 times daily for anxiety.    MEDICATION INSTRUCTIONS while in PHP    Always use medication only at prescribed doses.    Follow-up Appointments  While in PHP, you will have follow-up with the registered nurse or nurse practitioner as often as is clinically indicated for your unique situation. If you have questions or concerns about your medications that come up during the week, please fill out a medication related request form.    Medication-Specific Instructions  We have provided you with [employer] medication instruction sheet(s) on newly prescribed medications. You can use this as a reference if you have questions about these medications.    Medication Related Request Form  These forms are at the front desk and should be filled out if you have any of the following issues:    · If you are having what you think is an adverse reaction to your psychiatric medication.  · If you will be running out of your psychiatric medications. To avoid a gap in your prescription, please notify us 1 business day before your run out of your medication.   · If you have another urgent question about your psychiatric medication.  · If you have any newly prescribed medical medications, diagnosed medical problems, or medication/food allergies.    The request form will be reviewed, and our registered nurse or nurse practitioner will follow up with you.    Your Pharmacist is a Great Resource!  If you have a question or concern about your medication when program is not open, you can call or go to your pharmacy and speak with a pharmacist.

## 2018-02-13 NOTE — Progress Notes (Signed)
Switch Cymbalta (duloxetine) 60mg  to 2 tabs at AM, not PM.  Continue Trazadone 50mg  1-2 tabs at bedtime.  Take Hydroxyzine 10mg , 2 tab as needed I-2 times daily for anxiety.    MEDICATION INSTRUCTIONS while in PHP    Always use medication only at prescribed doses.    Follow-up Appointments  While in PHP, you will have follow-up with the registered nurse or nurse practitioner as often as is clinically indicated for your unique situation. If you have questions or concerns about your medications that come up during the week, please fill out a medication related request form.    Medication-Specific Instructions  We have provided you with [employer] medication instruction sheet(s) on newly prescribed medications. You can use this as a reference if you have questions about these medications.    Medication Related Request Form  These forms are at the front desk and should be filled out if you have any of the following issues:     If you are having what you think is an adverse reaction to your psychiatric medication.   If you will be running out of your psychiatric medications. To avoid a gap in your prescription, please notify us 1 business day before your run out of your medication.    If you have another urgent question about your psychiatric medication.   If you have any newly prescribed medical medications, diagnosed medical problems, or medication/food allergies.    The request form will be reviewed, and our registered nurse or nurse practitioner will follow up with you.    Your Pharmacist is a [employer] reat Resource!  If you have a question or concern about your medication when program is not open, you can call or go to your pharmacy and speak with a pharmacist.

## 2018-02-13 NOTE — Progress Notes (Addendum)
Adult Partial Hospitalization Program   Psychiatric Admission Note     Name: Casey Hughes  MRN: [id number]  DOB: April 08, 1991  Age: 27 y.o.  DATE OF SERVICE: 02/13/2018    Length of Session: 50 minutes.    Clinical Information Reviewed  [facility] intake assessment  [facility] referral form and Psych ED evaluation and notes  iSTOP checked yes    Reason For Referral  R PSY PRESENTING PROBLEM 02/09/2018   PRESENTING PROBLEM Depression, anxiety, suicidal ideation       HPI  Pt has a hx of outpatient mental health treatment for depression and anxiety, beginning approx age 38 and states "I was on all sorts of meds, but don't remember." She reports a hx of SIB (cutting) and SI in adolescence with an episode of tx at child PHP at [facility]. She denies a hx of inpatient admissions. Past med trials include venlafaxine, citalopram, milnacipran, sertraline, gabapentin, and quetiapine with minimal benefit. Pt has been treated for depression, anxiety, and fibromyalgia by Dr Isaiah Blakes and seen by Durenda Guthrie, an embedded MH clinician at her PCP office, for individual therapy over the last few years. Pt reports on/off relationship with partner who struggles with substance use. She reports a worsening of depression/anxiety after they broke up in May 2019, was seen at [facility] for active SI in July 2019, and then referred to [facility].    See [facility] intake assessment for detailed information on pt's HPI and clinical presentation.     At this time, pt presents with the following:  Pt presents with symptoms of depression: anhedonia, depressed mood, fatigue, feelings of worthlessness/guilt, hopelessness, recurrent thoughts of death, suicidal thoughts without plan and decreased appetite.  Pt presents with symptoms of anxiety: excessive worry, obsessive thoughts (intrusive, ego dystonic) and panic attacks - frequency 10x/wk with known triggers..  Pt presents with symptoms of psychosis: None noted.Marland Kitchen  Pt presents with no current/past symptoms of mania: no manic  symptoms  Pt presents with symptoms of post-traumatic stress disorder: recurrent and intrusive distressing memories of the traumatic event(s), recurrent distressing dreams with content/affect related to the traumatic event(s), dissociative reactions (flashbacks), intense or prolonged psychological distress at exposure to trauma-related cues, marked physiological reactions at exposure to trauma-related cues, persistent avoidance of trauma-related stimuli, negative alterations in cognitions and mood associated with the traumatic event(s) and hyperarousal  Pt presents with character pathology including Cluster B Traits.      Review of Systems   Constitutional: Positive for malaise/fatigue.   Gastrointestinal: Negative for nausea (ongoing issue, pt has seen PCP).   Musculoskeletal: Negative for back pain (and hip pain d/t fibromyalgia).   Psychiatric/Behavioral: Positive for depression and suicidal ideas. The patient is nervous/anxious (see HPI). The patient does not have insomnia.    All other systems reviewed and are negative.      Patient History  Patient Active Problem List   Diagnosis Code    Calculi, ureter N20.1    GERD (gastroesophageal reflux disease) K21.9    Hypothyroid E03.9    Bronchitis J40    Pneumonia J18.9    Anxiety F41.9    Depression F32.9    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    Renal calculi N20.0    SVT (supraventricular tachycardia) I47.1    Fibromyalgia M79.7    Positive ANA (antinuclear antibody) R76.8    Major depressive disorder, recurrent, moderate F33.1     Past Medical History:   Diagnosis Date    Anxiety     Chronic kidney disease  stone and horseshoe kidney    Depression     Endometriosis 2011    Fibromyalgia     Hypothyroid     Migraines     Seizures     last 03/2017 in context abrupt withdrawl from medication    SVT (supraventricular tachycardia) 2013     Past Surgical History:   Procedure Laterality Date    APPENDECTOMY      BUNIONECTOMY      COLONOSCOPY       lithotripsy         Current Medications  Current Outpatient Prescriptions   Medication Sig    omeprazole (PRILOSEC) 20 MG capsule Take 20 mg by mouth daily    cyclobenzaprine (FLEXERIL) 5 MG tablet Take 5 mg by mouth 3 times daily as needed for Muscle spasms    traZODone (DESYREL) 50 MG tablet Take 50 mg by mouth nightly    clonazePAM (KLONOPIN) 0.5 MG tablet Take 0.5 mg by mouth 2 times daily as needed for Anxiety    fluticasone (FLONASE) 50 MCG/ACT nasal spray 1 spray by Nasal route daily    DULoxetine (CYMBALTA) 60 MG capsule Take 120 mg by mouth daily       levonorgestrel, KYLEENA, (KYLEENA) 19.5 MG IUD 1 each by Intrauterine route once    hydrOXYzine HCl (ATARAX) 10 MG tablet Take 10 mg by mouth 3 times daily as needed for Itching or Anxiety     No current facility-administered medications for this visit.        Allergies  Allergies   Allergen Reactions    Cipro Xr      Bothered stomach       Tobacco Use Screen:  History   Smoking Status    Current Every Day Smoker    Packs/day: 1.00    Years: 5.00    Types: Cigarettes   Smokeless Tobacco    Never Used       Psychiatric History:  Refer to [employer] Intake Assessment    Alcohol/Drug History:  Refer to [employer] Intake Assessment    Social History:  Refer to [employer] Intake Assessment    Vocational History:  Refer to [employer] Intake Assessment    Family History:  Refer to [employer] Intake Assessment    Mental Status Exam  APPEARANCE: Appears stated age, Casual  ATTITUDE TOWARD INTERVIEWER: Cooperative and Superficial  MOTOR ACTIVITY: WNL (within normal limits)  EYE CONTACT: Direct  SPEECH: Normal rate and tone  AFFECT: Decreased Range  MOOD: Dysthymic  THOUGHT PROCESS: Normal and Intact  THOUGHT CONTENT: Negative Rumination  PERCEPTION: Within normal limits and No evidence of hallucinations  CURRENT SUICIDAL IDEATION: Vague/passive suicidal ideation, Without suicidal plan and Without Intent  CURRENT HOMICIDAL IDEATION: Patient denies  ORIENTATION: Alert and Oriented X  3.  CONCENTRATION: WNL  MEMORY:   Recent: intact   Remote: intact  COGNITIVE FUNCTION: Average intelligence and Fund of knowledge: appropriate to age and educational level  JUDGMENT: Intact  IMPULSE CONTROL: Fair  INSIGHT: Fair and Impaired due to sx of depression and cognitive distortions    Results  No recent labs in pt chart.     AIMS assessment completed?   No    Suicide Risk Assessment  [facility] Intake Suicide Risk Assessment Reviewed     SUICIDE FOLLOW UP  Suicide risk was assessed and No Change was noted from baseline formulation of risk and/or previous assessment.    ENGAGEMENT AND RELIABILITY   Engagement with interviewer: Good   Assessment of reliability  of report: Moderate   Additional details or comments: Pt is forthcoming w/ information; however, information varied through course of interview and was at times inconsistent             with information available in the record.      Violence Risk Assessment  [facility] Intake Suicide Risk Assessment Reviewed    VIOLENCE RISK FOLLOW UP  Violence risk was assessed and No Change noted from baseline formulation of risk and/or previous assessment.    Assessment/Formulation:  Pt is a 27 y.o. year old female referred to [facility] by [facility] for treatment of depression, anxiety, and passive SI that have been exacerbated by recent relationship stressors. Pt demonstrates sx of depression and anxiety, as noted in HPI, with recurrent thoughts of "not wanting to be around anymore." She denies active SI, plan, or intent at this time. Pt reports obsessive and intrusive thoughts that relate to self-harm or past trauma, and identifies these as ego-dystonic. She denies engaging in compulsions or rituals, but acknowledges intrusive thoughts as trigger for panic attacks. Pt reports panic attacks that "spiral" from anxious thoughts or trauma reminders. While she reports some panic attacks "come out of nowhere," this writer's impression is that pt is not yet able to identify triggers in  some situations. She reports resuming SIB (cutting) as a way to manage depressed mood and physical sx of tension. Pt endorses numerous trauma-related sx, as noted in HPI.      Additional contributing factors include chronic health issues along with hx of trauma, depression, and anxiety beginning in early childhood. Maternal family hx positive for depression, anxiety, suicide attempts, and substance use.     Diagnostic impression is consistent with MDD and GAD, secondary to chronic PTSD. Pt reports current meds: duloxetine, trazadone, and hydroxyzine (not taking) are not helping. She reports clonazepam is most helpful, but provides conflicting information on frequency of use. Pt could likely benefit from individual and group therapy to develop effective coping skills and address unresolved trauma. Writer discussed a decrease of duloxetine with plan to cross-taper to another SSRI, to better target sx of depression and anxiety. Writer discussed trial of minipress, for nightmares. Pt declined both options. Writer discussed option to switch existing dose of duloxetine to AM, as it could be worsening insomnia. Encouraged pt to continue trazadone and hydroxyzine, as prescribed by PCP. Pt gave informed consent to switch timing of duloxetine, and for writer to contact PCP to discuss plan. Provided extensive education on risk/benefits and side effects of current medication regime.    Patient has been unable or has failed to benefit from less intensive outpatient program. and Patient can reasonably be expected to make timely and significant practical improvement in the present acute symptoms as a result of participation in the Partial Hosptial Program.     Overall Clinical Judgment of Suicide Risk  (indicate your judgment of this individual's long and short term risk:               --Long term/Chronic Risk:  Moderate            --Short term/Acute Risk: Moderate    Is this a Medicare patient?  No, not a Medicare  patient.    Diagnosis  Diagnoses   Code Name Primary?    F33.1 Major depressive disorder, recurrent, moderate Yes    F41.1, F41.0 Generalized anxiety disorder with panic attacks     F43.10 Post traumatic stress disorder        Plan  1) Switch duloxetine 60mg , 2 tabs to AM dosing.  2) Continue trazadone 50mg , 1-2 tabs at bedtime for insomnia.  3) Resume hydroxyzine 10mg , 1-2 tabs twice a day as needed for anxiety.  4) Check CBC, CMP, and TSH  5) Contact PCP re: additional changes, at pt request.    Discussed therapeutic action, common side effects, and administration of this medication(s). Pt has no further questions at this time and was advised that they may request more information, ask additional questions, or change his/her mind in the future. Further, they understand that adjustments are often necessary to achieve therapeutic results and/or control side effects.    Psychotherapy continues as described in care plan; plan remains the same.  See [facility] Multidisciplinary Treatment Plan for short and long term goals related to treatment of the reason for admission to the Partial Hospitalization Program.  Will be seen for follow-up by NP in 3 days.  Anticipate discharge to referral to specialty provider for psychopharm aftercare.

## 2018-02-14 ENCOUNTER — Ambulatory Visit: Payer: Self-pay

## 2018-02-14 ENCOUNTER — Ambulatory Visit: Payer: Medicaid Other

## 2018-02-14 DIAGNOSIS — F1490 Cocaine use, unspecified, uncomplicated: Secondary | ICD-10-CM

## 2018-02-14 NOTE — Progress Notes (Signed)
Adult Partial Hospitalization Program  Clinical Management Note   Name: Casey Hughes  MRN: [id number]  DOB: 08/17/1990    02/14/2018    Writer spoke with pt's PCP, Dr. Isaiah BlakesMeaker, who agreed that pt's duloxetine is not effective at current dose (120mg  daily) and a decrease with plan to cross-taper to another SSRI is reasonable. Discussed concerns around continued use of clonazepam. PCP informed writer that the limited quantity was prescribed for short-term use only, and he agreed that it is not indicated at this time. PCP stated that he has previously referred [facility], as a specialty provider is preferred for ongoing psychopharm management, and pt did not follow through on that recommendation. He requested pt be [facility]. NP will  discuss these recommendations with pt, at next follow-up.

## 2018-02-14 NOTE — Progress Notes (Signed)
Adult Partial Hospitalization Program  Social Work Discharge Planning Note   Date of service: 02/14/2018  Time of session: 2:25 pm   Length of session: 35 min min    Pt was referred to Probation officer by Bearl Mulberry NP  to link to specialiaty medication provider .    Writer met with patient and explained the social work discharge planning role. Ms.Meeks informed Probation officer that she spoke with the pt's PCP who had been prescribing the pt's psychiatric medications. PCP informed Ms. Meeks that he referred the pt to [facility] to see a NP there but the pt did not follow up on the referral. PCP is recommending she be referred to a psychiatric provider. In meeting with the pt she is aware that the PCP wants her to see a psychiatric provider. Pt reports she will only see a psychiatrist because she trusts them more. Writer informed the pt that at [facility] there are mainly NP's. Probation officer and pt did Ship broker but found none in [place], [place] or [place]. Moved to [place] left a message for one and the other isn't scheduling until October. Pt then said she would see a NP at [facility] and signed a release of information form and declined a copy. Writer and pt called [facility] and was told to send the pt.'s clinical information for them to review.  NP's are scheduling into September. Writer faxed the clinical information and will follow up with them.     Plan: Writer will follow up with [facility] to see if pt will be accepted there.

## 2018-02-15 ENCOUNTER — Ambulatory Visit: Payer: Medicaid Other

## 2018-02-15 NOTE — Progress Notes (Signed)
Adult Partial Hospitalization Program  Clinical Management Note   Name: Lynn Itollison I Cork  MRN: [id number]  DOB: 06/02/1991    02/15/2018    Writer was asked by [facility] therapist Fuller Mandrilenise Plane to meet with pt to discuss a referral to a food cupboard. Pt reports she has no money for food and hasn't eaten yet today. Pt is willing to seek assistance from a food cupboard. Writer searched resources in the [place] area and found [employer] [phone] and called to initiate a referral. No referral needed and pt can go this evening form 7 pm to 8 pm to receive food. Pt was given the address and states she can get there this evening.   Writer and pt discussed pt applying [place] [facility]. Pt last applied for food stamps three years ago and was only approved for a three month period. When pt returns to work she works about 30 hours between two jobs at [employer]. Writer encouraged the pt to apply and called [place] to find out the best way for the pt to apply. Pt and Clinical research associatewriter were told that pt can go to the office to apply and if eligible pt would receive food stamps within 24 to 48 hours. Writer encouraged the pt to also apply for cash assistance to see if she is eligible for rental assistance. Pt is going to ask her mother if she will go with her to DSS to complete the application process. Pt will let writer know if she will [facility] do this or go after she completes PHP.   Pt nor writer has received a call [facility] regarding the clinical information sent and if pt can be seen there for  Medication management. Clinical information was faxed yesterday. Writer will continue to follow up on this.   Writer gave pt juice, 2 nutri grain bars and several crackers since pt has not had anything yet to eat today.

## 2018-02-15 NOTE — Progress Notes (Signed)
Adult Partial Hospitalization Program  Clinical Management Note   Name: Casey Hughes  MRN: [id number]  DOB: 08-25-1990    02/15/2018  20 mins    Met briefly with patient as his group leadersreported that patient was very irritable and negative in group all day and refused to do a tox screen.  Patient tearful with Probation officer stating that she does not like feeling irritable and just wants to isolate.  Explored with patient reasons for her increased irritability which she expressed was related to conversations with boyfriend.  Patient states that boyfriend continues to give her mixed messages and says things that he knows are hurtful and make her upset.  Patient does not feel that she could end this relationship though is recognizing that it is likely not healthy for her.  Patient's mood clearly fluctuates depending on boyfriend's responses to her.  Patient stated that she has no supports in her life and often feels very alone which makes it more difficult for her to end this relationship with boyfriend.  Reviewed grounding techniques and mindfulness skills with patient to help her get refocused and to manage her in irritability more appropriately.  Patient however continued to not want to do a tox screen reporting that she is not using drugs and we could not force her to do a screening Challenged patient about reporting on her first day of group that she had used cocaine the night previous.  Patient however adamantly denied that she said that.  It appears that patient may minimize and  not wanting to be honest about any possible use.  Patient talked about increased financial stressors  in terms of having gas money coming to program every day and not having money for food.  Writer referred patient to Ms Charlean Merl to assist patient in securing a food pantry.  Writer talked with patient about being on time to program in morning as this is an expectation for her to continue in treatment here.  Encouraged patient to try to  utilize at least one of the skills that she learned in program before stating that she is not finding them to be helpful.  Writer related to patient that she will only benefit and get out of program which she puts into it.  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

## 2018-02-16 ENCOUNTER — Ambulatory Visit: Payer: Medicaid Other

## 2018-02-16 ENCOUNTER — Other Ambulatory Visit
Admission: RE | Admit: 2018-02-16 | Discharge: 2018-02-16 | Disposition: A | Discharge status: Home / Self Care | Payer: Medicaid Other | Source: Ambulatory Visit | Attending: Psychiatry | Admitting: Psychiatry

## 2018-02-16 DIAGNOSIS — F411 Generalized anxiety disorder: Secondary | ICD-10-CM

## 2018-02-16 DIAGNOSIS — F603 Borderline personality disorder: Secondary | ICD-10-CM

## 2018-02-16 DIAGNOSIS — F331 Major depressive disorder, recurrent, moderate: Secondary | ICD-10-CM | POA: Insufficient documentation

## 2018-02-16 DIAGNOSIS — F410 Panic disorder [episodic paroxysmal anxiety] without agoraphobia: Secondary | ICD-10-CM

## 2018-02-16 DIAGNOSIS — Z79899 Other long term (current) drug therapy: Secondary | ICD-10-CM | POA: Insufficient documentation

## 2018-02-16 DIAGNOSIS — F4310 Post-traumatic stress disorder, unspecified: Secondary | ICD-10-CM

## 2018-02-16 LAB — COMPREHENSIVE METABOLIC PANEL
ALT: 17 U/L (ref 0–35)
AST: 15 U/L (ref 0–35)
Albumin: 4.3 g/dL (ref 3.5–5.2)
Alk Phos: 102 U/L (ref 35–105)
Anion Gap: 14 (ref 7–16)
Bilirubin,Total: 0.2 mg/dL (ref 0.0–1.2)
CO2: 27 mmol/L (ref 20–28)
Calcium: 9.4 mg/dL (ref 8.8–10.2)
Chloride: 99 mmol/L (ref 96–108)
Creatinine: 0.61 mg/dL (ref 0.51–0.95)
GFR,Black: 144 *
GFR,Caucasian: 125 *
Glucose: 80 mg/dL (ref 60–99)
Lab: 7 mg/dL (ref 6–20)
Potassium: 4.4 mmol/L (ref 3.3–5.1)
Sodium: 140 mmol/L (ref 133–145)
Total Protein: 8 g/dL — ABNORMAL HIGH (ref 6.3–7.7)

## 2018-02-16 LAB — CBC
Hematocrit: 44 % (ref 34–45)
Hemoglobin: 14 g/dL (ref 11.2–15.7)
MCH: 30 pg/cell (ref 26–32)
MCHC: 32 g/dL (ref 32–36)
MCV: 94 fL (ref 79–95)
Platelets: 308 10*3/uL (ref 160–370)
RBC: 4.7 MIL/uL (ref 3.9–5.2)
RDW: 13.7 % (ref 11.7–14.4)
WBC: 10.1 10*3/uL — ABNORMAL HIGH (ref 4.0–10.0)

## 2018-02-16 LAB — DRUG SCREEN CHEMICAL DEPENDENCY, URINE
Amphetamine,UR: NEGATIVE
Benzodiazepinen,UR: NEGATIVE
Cocaine/Metab,UR: NEGATIVE
Opiates,UR: NEGATIVE
THC Metabolite,UR: POSITIVE

## 2018-02-16 LAB — ETHYL GLUCURONIDE, UR: Ethyl glucuronide, Ur: NEGATIVE

## 2018-02-16 LAB — TSH: TSH: 0.8 u[IU]/mL (ref 0.27–4.20)

## 2018-02-16 NOTE — Progress Notes (Signed)
Behavioral Health Psychopharmacology Follow-up     Length of Session: 15 minutes.    Diagnosis Addressed    ICD-10-CM ICD-9-CM   1. Major depressive disorder, recurrent, moderate F33.1 296.32   2. Generalized anxiety disorder with panic attacks F41.1 300.02    F41.0 300.01   3. Post traumatic stress disorder F43.10 309.81   4. Borderline personality disorder F60.3 301.83       Chief Complaint: Patient states "I don't need anything from you and I'm frustrated."    LMP 02/12/2018   Wt Readings from Last 3 Encounters:   02/13/18 110.2 kg (243 lb)   02/03/18 104.3 kg (230 lb)   11/11/17 117.9 kg (260 lb)       Recent History and Response to Medications  Pt reports no change with switch to AM dosing of previously prescribed duloxetine, from PCP. Pt denies use of PRN hydroxyzine, despite persistent sx of anxiety, and states "I don't want to take it. It doesn't work like the other stuff and you won't give me that."    Current use of alcohol or drugs: unknown, pt has given conflicting reports to staff and refused urine screen yesterday.    ENERGY: Fair  SLEEP: Disturbed. Pt declined to provide further detail.   APPETITE: Pt declined to answer.  WEIGHT: Pt declined to answer, no observable change from previous visit.  SEXUAL FUNCTION: not asked  ENJOYMENT/INTEREST: Pt declined to answer.    Review of Systems:  Pt declined to answer ROS questions.    Current Medications  Current Outpatient Prescriptions   Medication Sig    omeprazole (PRILOSEC) 20 MG capsule Take 20 mg by mouth daily    cyclobenzaprine (FLEXERIL) 5 MG tablet Take 5 mg by mouth 3 times daily as needed for Muscle spasms    traZODone (DESYREL) 50 MG tablet Take 50 mg by mouth nightly    clonazePAM (KLONOPIN) 0.5 MG tablet Take 0.5 mg by mouth 2 times daily as needed for Anxiety    fluticasone (FLONASE) 50 MCG/ACT nasal spray 1 spray by Nasal route daily    DULoxetine (CYMBALTA) 60 MG capsule Take 120 mg by mouth daily       hydrOXYzine HCl (ATARAX) 10 MG  tablet Take 10 mg by mouth 3 times daily as needed for Itching or Anxiety    levonorgestrel, KYLEENA, (KYLEENA) 19.5 MG IUD 1 each by Intrauterine route once     No current facility-administered medications for this visit.        Side Effects  Other: Pt declined to discussed    Mental Status  APPEARANCE: Appears younger than stated age, Casual  ATTITUDE TOWARD INTERVIEWER: Uncooperative and Hostile  MOTOR ACTIVITY: WNL (within normal limits)  EYE CONTACT: Glaring  SPEECH: Normal rate and tone and Minimal  AFFECT: Irritable  MOOD: Angry  THOUGHT PROCESS: Normal  THOUGHT CONTENT: Negative Rumination and Preoccupations  PERCEPTION: Within normal limits and No evidence of hallucinations  ORIENTATION: Alert and Oriented X 3.  CONCENTRATION: WNL  MEMORY:   Recent: intact   Remote: intact  COGNITIVE FUNCTION: Average intelligence  JUDGMENT: Impaired -  mild  IMPULSE CONTROL: Poor  INSIGHT: Poor    Risk Assessment  Self Injury: Patient Denies  Suicidal Ideation: Patient Denies  Homicidal Ideation: Patient Denies  Aggressive Behavior: Yes. Describe: verbally impulsive and hostile around substance use/controlled medications    If any of the answers above are Yes, is there access to lethal means?   N/A    [place] Scale administered?  N/A    Results  none. Pt did not get labs ordered at previous visit, as discussed, when asked about this she stated "I don't want to do it." Writer reminded pt of rationale for these labs and encouraged her to have them done.    Current Treatment Plan   Created/Updated On 02/09/2018   Next Treatment Plan Due 02/26/2018       Assessment:  Pt does not report side effects from change to AM dosing of duloxetine. Pt continues to report anxiety, but states she will not take PRN hydroxyzine. She expressed frustration that this Clinical research associatewriter would not prescribe clonazepam, and stated her PCP "already said he sent a refill for me and I only trust him with my meds." Pt would likely benefit from medication  adjustments, as discussed in initial assessment note by this Clinical research associatewriter.    Plan and Rationale:  Pt declined to discuss additional medication changes/adjustments. Writer reviewed process for contacting RN/NP if questions or needs arise during the [facility].

## 2018-02-16 NOTE — Progress Notes (Signed)
Adult Partial Hospitalization Program  Clinical Management Note   Name: Casey Hughes  MRN: [id number]  DOB: 06/12/1991    02/16/2018    Writer [facility] [phone] letting writer know that the pt.'s referral for a medication provider is in review and an answer should be received on Monday 02-19-18.  Writer will wait for a return call on Monday.

## 2018-02-19 ENCOUNTER — Ambulatory Visit: Payer: Medicaid Other

## 2018-02-19 DIAGNOSIS — F1490 Cocaine use, unspecified, uncomplicated: Secondary | ICD-10-CM

## 2018-02-19 LAB — THC SEMI-QUANT, URINE
Creatinine,UR: 307 mg/dL — ABNORMAL HIGH (ref 20–300)
Semi-Quant THC,UR: 3200 ng/mL
THC/Creat Ratio: 1042 ng/mg

## 2018-02-19 NOTE — Progress Notes (Signed)
Adult Partial Hospitalization Program   Primary Therapist Progress Note     Name: KAO BERKHEIMER  MRN: [id number]  DOB: June 05, 1991    Date of Service: 02/19/2018  Length of session: 30 min    Contact Type:  Individual Psychotherapy    Comprehensive Treatment Plan Goals:  Depression    1. Individualized Manifestations:  Mahima has experienced decreased appetite, decreased daily functioning, feelings of worthlessness, poor concentration and substantial depressed/sad/low/empty mood in the 4 weeks prior to admission.  Jacquese has experienced anxiety symptoms associated with the current depressive episode including difficulty concentrating, excessive anxiety and worry.    2. Goal:  To decrease the impact of depressive symptoms on daily functioning.  Goal individualized as evidenced by the following statement in Allisons words: learn coping skills.    3. Objectives:  SIERRA SPARGO will identify, discuss and problem-solve1-2 triggers for episodes of sad, low, empty mood.  Lynn Ito will learn and practice 1-2 new coping skills to reduce episodes of sad, low, empty mood symptoms for at least 5 days.  Lynn Ito will monitor and self-report positive characteristics or strengths, accept compliments, identify and verbally express needs for at least 3 days.    Progress to date:   Patient reported her mood feeling slightly less depressed than upon admission.  Patient recognized how issues related to her current relationship has become emotionally dysregulation in.  Patient has been able to utilize grounding techniques and beginning to challenge cognitive distortions and use of Wise Mind to separate her emotional and rational thinking and make more clear and logical decisions.  Patient has been able to begin to try to replace negative self thoughts with positive ones she acknowledges her sense of poor self worth and how she frequently beats up herself.    4Trauma Related Disorders  Deferred at [place]  time      Safety - Self-Injury, Suicidal Thoughts, Suicide Attempt, Physical Aggression, Violent Thoughts, and/or Homicidal Ideation    1. Individualized Manifestations:  Armine has been engaging in self-injury behavior by cutting during the one week prior to admission.  Annitta has experienced an increase in passive suicidal ideation during the one week prior to admission.    2. Goal:  To decrease Allisons risk of harm to self and/or others and more effectively use her safety plan which includes use of healthy coping skills.  Goal individualized as evidenced by the following statement in Allisons words: Learn to cope with my feelings better.    3. Objectives:  Anai will create an individual safety plan and will utilize the support options identified when experiencing suicidal thoughts and urges to self-injure.  Ocie will discuss and problem solve 1-2 triggers to suicidal thoughts and urges to self-injure.  Patina will learn and practice at least 2 new coping skills and strategies and discuss them with therapist and in group.  Jakeline will experience a decrease in self-injury behavior from 2 to 3 times per week and will use of skills instead of self-injury for at least 7 consecutive days.    Progress to date:  Patient reported that she has not engaged in self-injurious behaviors in over a week and suicidal thoughts have begun to decrease in intensity and frequency.  Patient stated that she's been using distraction skills and obsessive action as a way to manage keeping herself safe.  Patient also has actively worked on her safety plan and feels able to utilize it if needed..    Engagement in Program  Compliance with medication regimen: Good   Participation in Groups: Good   Substance use: No     Mental Status Exam  APPEARANCE: Well-groomed, Casual  ATTITUDE TOWARD INTERVIEWER: Cooperative  MOTOR ACTIVITY: WNL (within normal limits)  EYE CONTACT: Direct  SPEECH: Normal rate and tone  AFFECT:  Flat  MOOD: Anxious and Depressed  THOUGHT PROCESS: Intact  THOUGHT CONTENT: Negative Rumination  PERCEPTION: Within normal limits  CURRENT SUICIDAL IDEATION: patient denies  CURRENT HOMICIDAL IDEATION: Patient denies  ORIENTATION: Alert and Oriented X 3.  CONCENTRATION: Good  MEMORY:   Recent: intact   Remote: intact  COGNITIVE FUNCTION: Average intelligence  JUDGMENT: Intact  IMPULSE CONTROL: Good  INSIGHT: Good    Risk Assessment  SELF-INJURY:Ideation   SUICIDAL IDEATION: Vague/passive suicidal ideation, Without suicidal plan and Without Intent  HOMICIDAL IDEATION: Patient denies  AGGRESSIVE BEHAVIOR: No evidence    Suicide risk assessed and updated   No changes from baseline Preadmission Lethality Assessment    Violence risk assessed and updated   Violence risk was assessed and No Change noted from baseline formulation of risk and/or previous assessment.    Patient Health Questionnaire [facility](PHQ9)       Session Content  Patient reported that she had a good weekend and was able to achieve many of the goals that she set.  Patient worked on her laundry and when out with a friend.  Patient also spent time with boyfriend who is in rehab and felt that they had a good discussion.  Patient seemed more reassured in feeling that if boyfriend were to break up the relationship again that she would just walk away and not try to make it work.  Patient worked hard on refocusing from negative self thoughts throughout the weekend as she acknowledged how her sense of poor self worth feeds into issues of codependency.  Patient was able to begin to identify things that she could do to feel better about herself.  Patient talked of trying to find a new job and has thought about possibly taking a [facility] [employer] [facility] [employer] as she is thinking about returning back to school in the future.  Patient was encouraged to continue to challenge cognitive distortions and work on skills of distress tolerance.  Will plan for discharge at the end of the week  Will  continue to assess progress and mental status.    Current Treatment Plan   Created/Updated On 02/09/2018   Next Treatment Plan Due 02/26/2018       Interventions/Plan  Safety Plan reviewed with patient.  Psychotherapy continues as described in treatment plan.

## 2018-02-20 ENCOUNTER — Telehealth: Payer: Self-pay

## 2018-02-20 ENCOUNTER — Ambulatory Visit: Payer: Medicaid Other

## 2018-02-20 DIAGNOSIS — F1490 Cocaine use, unspecified, uncomplicated: Secondary | ICD-10-CM

## 2018-02-20 LAB — DRUG SCREEN CHEMICAL DEPENDENCY, URINE
Amphetamine,UR: NEGATIVE
Benzodiazepinen,UR: NEGATIVE
Cocaine/Metab,UR: NEGATIVE
Opiates,UR: NEGATIVE
THC Metabolite,UR: POSITIVE

## 2018-02-20 LAB — ETHYL GLUCURONIDE, UR: Ethyl glucuronide, Ur: NEGATIVE

## 2018-02-20 LAB — CONFIRM THC METABOLITE, URINE: Confirm THC Metab: POSITIVE

## 2018-02-20 NOTE — Telephone Encounter (Signed)
Clinical Management Note   Name:  Casey Hughes, Casey Hughes  MRN:  [id number]  DOB:  [birth year]    02/20/2018    Writer contacted pt's PCP office re: PCP taking over prescribing of psychiatric medications [facility] until fuv with Asa Lenteerry Yonker, NP on 04/12/18.  Writer spoke with [employer].  Writer provided [employer] with list of current psychiatric medications, and shared that pt refused recommended medication changes [facility].  Lesle ReekBarb stated she would speak with PCP and return call with response.

## 2018-02-20 NOTE — Progress Notes (Addendum)
Adult Partial Hospitalization Program  Clinical Management Note   Name: Casey Hughes  MRN: [id number]  DOB: March 01, 1991    02/20/2018    Writer met with pt at 1:10 pm for 15 minutes. Writer informed the pt that [facility] returned writer's call and Probation officer was informed that at this time there is not a provider that take the pt on as a pt. Writer and pt discussed calling [employer] [phone] and pt agreed. Call was made on speaker phone so pt was involved in the process. Pt is scheduled to see Vedia Pereyra NP on 04-12-18 at 12:45 pm. Pt signed release of information form and declined a copy.   Due to the length of time between PHP d/c on 02-23-18 and the above appointment writer requested that [facility] RN Hal Neer call the pt's PHP to discuss ongoing medication follow up until the 04-12-18 appointment date. Pt was agreeable to this plan.

## 2018-02-21 ENCOUNTER — Ambulatory Visit: Payer: Medicaid Other

## 2018-02-21 LAB — THC SEMI-QUANT, URINE
Creatinine,UR: 92 mg/dL (ref 20–300)
Semi-Quant THC,UR: 670 ng/mL
THC/Creat Ratio: 728 ng/mg

## 2018-02-21 NOTE — Progress Notes (Signed)
Adult Partial Hospitalization Program  Clinical Management Note   Name: Casey Hughes  MRN: [id number]  DOB: 07/16/1991    02/21/2018    20 mins  Patient requested to meet with writer's patient expressed that over the past she felt suicidal thoughts were increasing.  Patient however denied current suicidal ideation plan or intent.  Patient indicated that after she left program last night and driving home suicidal thoughts became more intense that she was able to refocus from these thoughts and called a friend which found to be helpful.  Patient began to recognize how discussion of borderline personality disorder may have contributed to her suicidal thoughts as she resonated with this diagnosis.  Patient stated that she was aware that she had borderline personality disorder previously though seemed more aware of how these issues have continued to influence her life.  Patient also stated that she visited with boyfriend and felt that her visit went well though seemed to have come away from it feeling that boyfriend may have been distancing from her.  Patient recognized how issues of abandonment rejection impact her moods and emotional dysregulation.  Writer reviewed with patient her safety plan which she felt able to utilize if needed.  Encouraged patient to continue to use all the skills that she has been working on especially those of distraction and opposite action.  Patient acknowledged that she was feeling anxious about upcoming discharge which also may have contributed to her feelings of abandonment as well.  Will continue to assess progress and safety

## 2018-02-22 ENCOUNTER — Ambulatory Visit: Payer: Medicaid Other | Attending: Psychiatry

## 2018-02-22 DIAGNOSIS — F1490 Cocaine use, unspecified, uncomplicated: Secondary | ICD-10-CM | POA: Insufficient documentation

## 2018-02-22 DIAGNOSIS — F603 Borderline personality disorder: Secondary | ICD-10-CM | POA: Insufficient documentation

## 2018-02-22 DIAGNOSIS — F331 Major depressive disorder, recurrent, moderate: Secondary | ICD-10-CM | POA: Insufficient documentation

## 2018-02-22 DIAGNOSIS — F410 Panic disorder [episodic paroxysmal anxiety] without agoraphobia: Secondary | ICD-10-CM | POA: Insufficient documentation

## 2018-02-22 DIAGNOSIS — F4310 Post-traumatic stress disorder, unspecified: Secondary | ICD-10-CM | POA: Insufficient documentation

## 2018-02-22 DIAGNOSIS — Z79899 Other long term (current) drug therapy: Secondary | ICD-10-CM | POA: Insufficient documentation

## 2018-02-22 DIAGNOSIS — F411 Generalized anxiety disorder: Secondary | ICD-10-CM | POA: Insufficient documentation

## 2018-02-22 NOTE — Progress Notes (Signed)
[facility]  ADULT AMBULATORY GROUP THERAPY SERVICE REFERRAL     Identifying Data:  Patient: Casey Hughes   Medical Record Number: [id number]  DOB: 07-11-1991  Address: [address]  [place] [address]     Patient Phone: [phone] (home)     Insurance: Payor: [employer] farm [employer] MEDICAID / Plan: BLUE CHOICE OPTION MEDICAID / Product Type: *No Product type* /        Check where patient is currently open in the system:     []   [facility] Adult General     []   [facility] Deaf Wellness     []   [facility]    []   [facility] Marriage and Family     []   [facility] Behavioral Medicine    []   [facility] Older Adult     []   [facility]     Diagnosis:   No diagnosis found.    History:  Was patient HOSPITALIZED within the last three months? No    Past PSYCHIATRIC HISTORY: Partial, Outpatient  Previous GROUP EXPERIENCE: Yes       Risk Factors:  Suicide or self-mutilation: Past current cutting on 02-03-18   Violence: None  Alcohol Use: Past  Hasn't had a drink in 4 months.   Drug Use: None    Goals for Group Treatment:  Goal #1: Learn interpersonal skills   Goal #2: improve relationships      Adult Group Therapy Offerings (Please check preferential group and time):    BH Adult General    []  Depression Management: Mon. 4:00-5:30  []  Anxiety Management:  Tues. 3:30-5:00  []  Interpersonal Therapy for Intimate Partner Violence (HEAL):  Wed. 3:00-4:30  []  Chronic Pain Management: Wed. 12:30-2:00   []  Present Centered Group Therapy of Survivors of Trauma (*requirement must have completed distress tolerance): Thurs. 3:00-4:00  []  Brief Focused Group ( Interpersonal Process Group): Thurs. 5:00-6:00    DBT Modules    []  Distress Tolerance Skills Training:    []  Mon. 1:00-2:30 []  Mon. 5:30-7:00  []  Tues. 11:00-12:30 []  Tues. 4:00-5:30 []  Tues.   6:30-8:00 []  Wed. 1:00-2:30 []  Wed. 5:30-7:00  []  Thurs.3:00-4:30     []  Emotional Regulation Skills Training:    []  Mon. 1:00-2:30 []  Mon. 5:30-7:00 []  Tues. 11:00-12:30 []  Tues.  4:00-5:30 []  Tues.   6:30-8:00  []  Wed. 1:00-2:30 []  Wed.5:30-7:00 []  Thurs. 3:00-4:30    [x]  Interpersonal Skills Training:    [x]  Mon. 1:00-2:30 []  Mon. 5:30-7:00 []  Tues. 11:00-12:30 []  Tues. 4:00-5:30 []  Tues.   6:30-8:00  []  Wed. 1:00-2:30   [x]  Wed.5:30-7:00 [x]  Thurs. 3:00-4:30  Pt can attend any of these days and times for the group.     []  DBT Advanced Skills Training (must have completed [facility] DBT rotation): Tues. 6:30-8:00    []  Other      [facility]   Spanish speaking Patients  ? CBT Brief Focus Group:  Monday 1:45-3:00   ? CBT Brief Focus Group:  Wednesday 2:15-3:30   ? CBT for Beginners Depression Group:  Wednesday 9:45 -11:00   ? Advanced CBT Depression Group:  Wednesday 10:45-12:00    [facility]  []  Anxiety Management   []  Listening Skills  []  DBT (Emotion Regulation, Interpersonal Skills, Distress Tolerance)  []  CBT for Depression/Anxiety Management (SPANISH ONLY)  []  Symptom Management for Psychosis  []  Complicated Grief  []  Relaxation  []  Mental Illness/Chemical Addiction  []  Survivors of Trauma  []  Transition Group  Comments:  Is the patient receptive to this referral? Yes  Will you be involved in the patient's continued care? No  Does patient have an individual therapist? Yes    Name of Therapist: Durenda GuthrieKirk Palmer [phone]    Is he/she in support of this referral? Yes      Referral Processing Information:  The above information must be completed in full for referral to be processed.    Please send completed form to Valli GlanceWanda Markert, Psychiatry via [facility] for [facility].  Please send completed form to Garnette GunnerPam Geil, Psychiatry via [facility] for [facility].

## 2018-02-22 NOTE — Telephone Encounter (Addendum)
Adult Partial Hospitalization Program  Clinical Management   Care Coordination Note   Name: Lynn Itollison I Yoshino  MRN: [id number]  DOB: 02/12/1991    02/22/2018    RN placed follow up call to PCP spoke with Lesle ReekBarb; reiterated we are seeking a bridge for pt's psychiatric medications upon [facility] with Asa Lenteerry Yonker, NP on 04/12/18.  Writer pconfirmed Barb they have list of current psychiatric medications which is unchanged from Dr Raymon MuttonMeaker's regimen as pt refused recommended medication changes [facility].     Requested FUV in either case:  Friday 03/02/18 at 3:30PM with Dr Isaiah BlakesMeaker.     Provided Clinical research associatewriter direct contact information: Leana GamerJennifer L [place], RN Adult Partial Hospitalization Program     [phone]     Awaiting return call    Current Outpatient Prescriptions   Medication Sig    omeprazole (PRILOSEC) 20 MG capsule Take 20 mg by mouth daily    cyclobenzaprine (FLEXERIL) 5 MG tablet Take 5 mg by mouth 3 times daily as needed for Muscle spasms    traZODone (DESYREL) 50 MG tablet Take 50 mg by mouth nightly    clonazePAM (KLONOPIN) 0.5 MG tablet Take 0.5 mg by mouth 2 times daily as needed for Anxiety    fluticasone (FLONASE) 50 MCG/ACT nasal spray 1 spray by Nasal route daily    DULoxetine (CYMBALTA) 60 MG capsule Take 120 mg by mouth daily       hydrOXYzine HCl (ATARAX) 10 MG tablet Take 10 mg by mouth 3 times daily as needed for Itching or Anxiety    levonorgestrel, KYLEENA, (KYLEENA) 19.5 MG IUD 1 each by Intrauterine route once       Update: 4:12 PM   [facility] Dr Isaiah BlakesMeaker will BRIDGE the medications to cover pt to her [facility].   Pt has BH FUV 02/26/18 at 11AM with embedded therapist Durenda GuthrieKirk Palmer at the PCP office; and [facility].        [facility] Tx team updated.

## 2018-02-22 NOTE — Progress Notes (Signed)
Adult Partial Hospitalization Program  Clinical Management Note   Name: Casey Hughes  MRN: [id number]  DOB: [birth year]    02/22/2018    Saundra Shelling asked writer to see the pt to discuss a referral to [facility] to a skills group. Writer met with pt at 10:05 am for 15 minutes and discussed the groups. Pt is interested in the interpersonal skills group. A referral was completed and routed to [facility], intake, [phone]. Pt aware that Ms. Cleotilde Neer will contact her directly to schedule an intake for the group. Pt is aware that the next groups are not starting until late August/beginning of September.   No further social work needs at this time.

## 2018-02-23 ENCOUNTER — Ambulatory Visit: Payer: Medicaid Other

## 2018-02-23 DIAGNOSIS — F603 Borderline personality disorder: Secondary | ICD-10-CM

## 2018-02-23 DIAGNOSIS — F4310 Post-traumatic stress disorder, unspecified: Secondary | ICD-10-CM

## 2018-02-23 DIAGNOSIS — F331 Major depressive disorder, recurrent, moderate: Secondary | ICD-10-CM

## 2018-02-23 DIAGNOSIS — F410 Panic disorder [episodic paroxysmal anxiety] without agoraphobia: Secondary | ICD-10-CM

## 2018-02-23 DIAGNOSIS — F411 Generalized anxiety disorder: Secondary | ICD-10-CM

## 2018-02-23 DIAGNOSIS — F1490 Cocaine use, unspecified, uncomplicated: Secondary | ICD-10-CM

## 2018-02-23 DIAGNOSIS — F1210 Cannabis abuse, uncomplicated: Secondary | ICD-10-CM

## 2018-02-23 LAB — CONFIRM THC METABOLITE, URINE: Confirm THC Metab: POSITIVE
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
## 2018-02-23 NOTE — Progress Notes (Incomplete)
Adult Partial Hospitalization Program  RN Clinical Management  Note   Discharge Medications   Name: Lynn Itollison I Baria  MRN: [id number]  DOB: 10/05/1990    02/23/2018  Brief chart review to assess meds for planned discharge; Reviewed NP/MD Provider plan:   Plan  1) Switch duloxetine 60mg , 2 tabs to AM dosing.  2) Continue trazadone 50mg , 1-2 tabs at bedtime for insomnia.  3) Resume hydroxyzine 10mg , 1-2 tabs twice a day as needed for anxiety.  4) Check CBC, CMP, and TSH  5) Contact PCP re: additional changes, at pt request.    Discussed therapeutic action, common side effects, and administration of this medication(s). Pt has no further questions at this time and was advised that they may request more information, ask additional questions, or change his/her mind in the future. Further, they understand that adjustments are often necessary to achieve therapeutic results and/or control side effects.    Psychotherapy continues as described in care plan; plan remains the same.  See [facility] Multidisciplinary Treatment Plan for short and long term goals related to treatment of the reason for admission to the Partial Hospitalization Program.  Will be seen for follow-up by NP in 3 days.  Anticipate discharge to referral to specialty provider for psychopharm aftercare.      Electronically signed by Serafina RoyalsWeeks, Alexa Anne, NP at 02/14/2018 10:16 AM  Electronically signed by Serafina RoyalsWeeks, Alexa Anne, NP at 02/15/2018 9:49 AM         Pt's discharge is extended into next week

## 2018-02-23 NOTE — Progress Notes (Signed)
Adult Partial Hospitalization Program  Clinical Management Note     Patient Name: Casey Hughes  MRN: [id number]  DOB: 07/09/1991    Date: 02/23/2018    Writer completed insurance review today on pt Casey ItoAllison I Mcmanaman with Ricke HeyBess Engle[employer] of [employer]. Reviewer's contact #: [phone].  Pt is approved 1 additional days through 02/26/18.  Authorization # (confirmed with reviewer): [id number]

## 2018-02-26 ENCOUNTER — Ambulatory Visit: Payer: Medicaid Other

## 2018-02-26 ENCOUNTER — Other Ambulatory Visit: Payer: Self-pay

## 2018-02-26 MED ORDER — DULOXETINE HCL 60 MG PO CPEP *I*
120.0000 mg | DELAYED_RELEASE_CAPSULE | Freq: Every day | ORAL | 0 refills | Status: AC
Start: 2018-02-26 — End: ?

## 2018-02-26 MED ORDER — HYDROXYZINE HCL 10 MG PO TABS *I*
10.0000 mg | ORAL_TABLET | Freq: Three times a day (TID) | ORAL | 0 refills | Status: DC | PRN
Start: 2018-02-26 — End: 2018-04-28

## 2018-02-26 MED ORDER — TRAZODONE HCL 50 MG PO TABS *I*
50.0000 mg | ORAL_TABLET | Freq: Every evening | ORAL | 0 refills | Status: DC
Start: 2018-02-26 — End: 2022-08-22
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
## 2018-02-26 NOTE — BH Discharge Summary (Signed)
Adult Partial Hospitalization Program   Discharge Instructions     Name: Casey Hughes  MRN: [id number]   DOB: 1991/04/14    DISCHARGE INSTRUCTIONS:  R PSY DATE ADMITTED 02/09/2018   DATE ADMITTED TO CURRENT [facility] PROGRAM 02/12/2018      R PSY DC DATE 02/23/2018   DISCHARGE DATE 02/26/2018       Current Outpatient Prescriptions   Medication Sig    omeprazole (PRILOSEC) 20 MG capsule Take 20 mg by mouth daily    cyclobenzaprine (FLEXERIL) 5 MG tablet Take 5 mg by mouth 3 times daily as needed for Muscle spasms    traZODone (DESYREL) 50 MG tablet Take 50 mg by mouth nightly    clonazePAM (KLONOPIN) 0.5 MG tablet Take 0.5 mg by mouth 2 times daily as needed for Anxiety    fluticasone (FLONASE) 50 MCG/ACT nasal spray 1 spray by Nasal route daily    DULoxetine (CYMBALTA) 60 MG capsule Take 120 mg by mouth daily       hydrOXYzine HCl (ATARAX) 10 MG tablet Take 10 mg by mouth 3 times daily as needed for Itching or Anxiety    levonorgestrel, KYLEENA, (KYLEENA) 19.5 MG IUD 1 each by Intrauterine route once     No current facility-administered medications for this visit.        We have provided enough medication to last you until your next psychiatric assessment.  Refills or changes in medication must be done by your new Outpatient Provider.  It is VITAL that you keep your scheduled appointments.    Patient Instructions  FOLLOW-UP PROVIDER APPOINTMENTS:  Type 02/23/2018     Therapist       Name 02/23/2018     Durenda Guthrie     Date 02/23/2018     03/01/2018     Time 02/23/2018     11:00 AM     Phone 02/23/2018     [phone]     Fax 02/23/2018     [phone]     Address 02/23/2018     [address]  [place], [place]     -------------------------------------------------------------  Type 02/20/2018     NP     Name 02/20/2018     [facility] - Asa Lente NP      Date 02/20/2018     04/12/2018      Time 02/20/2018     12:45 PM     Phone 02/20/2018     [phone]     Fax 02/20/2018     [phone]      Address 02/20/2018     [address] [place] [address]. Arrive at 12:45 pm for a 1 pm appointment. Bring photo ID and your insurance card.      -------------------------------------------------------------  Type 02/22/2018     Other     Name 02/22/2018     [facility]      Phone 02/22/2018     [phone]      Fax 02/22/2018     in e-record       Address 02/22/2018     [address] [place] [address]. Valli Glance intake coordinator will call you to schedule an intake for the interpersonal skills group. A referral has been submitted.       -------------------------------------------------------------  Type 02/22/2018     PCP     Name 02/22/2018     Dr. Isaiah Blakes       Date 02/22/2018  03/02/2018     Time 02/22/2018      3:30 PM      Phone 02/22/2018     [phone]     Fax 02/22/2018     [phone]     Address 02/22/2018     [address] [place] [id number]. Please bring your bottles of medications to this appointment. Dr. Isaiah BlakesMeaker will continue to prescribe and monitory you medications until your appointment with the psychiatric NP on 04-12-18.        Primary Care Physician: Wyline CopasMeaker, Philip S, MD  Phone: [phone]  Fax: [phone]  Address:  [phone]  [place] [phone]  See your doctor as needed for your medical care.    REASON FOR DISCHARGE:  No longer requires this level of care    GENERAL INSTRUCTIONS: [facility] Helpline/Mobile Crisis Team: [phone] (24 hours/7 days); TTY [phone]; Out of [place] [phone].   The above information has been discussed with me and I have received a copy.  I understand that I am advised to follow the instructions given to me to appropriately care for my condition.     _____________________________________________________________  Patient Signature:     Date: __________________________ Time: ___________________________

## 2018-02-26 NOTE — Progress Notes (Signed)
Adult Partial Hospitalization Program  RN Clinical Management  Note   Discharge Medications   Name: Casey Hughes  MRN: [id number]  DOB: 01/01/1991    02/26/2018   Writer assessed chart meds for planned discharge.      Brief chart review to assess meds for planned discharge; Reviewed NP/MD Provider plan:   Current Outpatient Prescriptions   Medication Sig    omeprazole (PRILOSEC) 20 MG capsule Take 20 mg by mouth daily    cyclobenzaprine (FLEXERIL) 5 MG tablet Take 5 mg by mouth 3 times daily as needed for Muscle spasms    traZODone (DESYREL) 50 MG tablet Take 50 mg by mouth nightly    clonazePAM (KLONOPIN) 0.5 MG tablet Take 0.5 mg by mouth 2 times daily as needed for Anxiety    fluticasone (FLONASE) 50 MCG/ACT nasal spray 1 spray by Nasal route daily    DULoxetine (CYMBALTA) 60 MG capsule Take 120 mg by mouth daily       hydrOXYzine HCl (ATARAX) 10 MG tablet Take 10 mg by mouth 3 times daily as needed for Itching or Anxiety    levonorgestrel, KYLEENA, (KYLEENA) 19.5 MG IUD 1 each by Intrauterine route once     30 day scripts with 0 refills queued up for NP Provider for review & signature:  Trazodone-50 mg-30 quantity-0 Refills. Take 50 by mouth nightly.  Duloxetine-60 mg -60 quantity-0 Refills. Take 120 mg by mouth daily.  Hydroxyzine 10 mg- 90 quantity-0 refills. Take 10 mg by mouth 3 times daily PRN.     Route to pt's preferred Pharmacy :  [facility] [facility] [place] [phone] (Phone)  [phone] (Fax

## 2018-02-27 ENCOUNTER — Telehealth: Payer: Self-pay

## 2018-02-27 NOTE — Telephone Encounter (Signed)
Writer attempted to reach PT regarding the [facility] group [facility].  Writer left a VM for PT to return the call.    When PT calls back Writer will offer PT a group screening with Trula SladeWilliam Jarvie for the Monday 1;00 -2:30 PM group, starting 04/09/18, which is the Interpersonal Effectiveness Skills  Group.

## 2018-03-07 NOTE — Telephone Encounter (Signed)
Writer attempted to reach PT for the second time regarding the [facility]BT group [facility].  Writer left a second VM for PT to return the call.

## 2018-03-19 ENCOUNTER — Other Ambulatory Visit: Payer: Self-pay | Admitting: General Surgery

## 2018-03-19 DIAGNOSIS — Q639 Congenital malformation of kidney, unspecified: Secondary | ICD-10-CM | POA: Insufficient documentation

## 2018-03-19 DIAGNOSIS — R1011 Right upper quadrant pain: Secondary | ICD-10-CM

## 2018-03-21 ENCOUNTER — Ambulatory Visit
Admission: RE | Admit: 2018-03-21 | Discharge: 2018-03-21 | Disposition: A | Discharge status: Home / Self Care | Payer: Medicaid Other | Source: Ambulatory Visit | Attending: General Surgery | Admitting: General Surgery

## 2018-03-21 DIAGNOSIS — R1011 Right upper quadrant pain: Secondary | ICD-10-CM | POA: Insufficient documentation

## 2018-03-21 DIAGNOSIS — Q631 Lobulated, fused and horseshoe kidney: Secondary | ICD-10-CM

## 2018-04-28 ENCOUNTER — Emergency Department
Admission: EM | Admit: 2018-04-28 | Discharge: 2018-04-28 | Disposition: A | Discharge status: Home / Self Care | Payer: Medicaid Other | Source: Ambulatory Visit | Attending: Emergency Medicine | Admitting: Emergency Medicine

## 2018-04-28 DIAGNOSIS — M545 Low back pain, unspecified: Secondary | ICD-10-CM

## 2018-04-28 DIAGNOSIS — G43109 Migraine with aura, not intractable, without status migrainosus: Secondary | ICD-10-CM

## 2018-04-28 DIAGNOSIS — G8929 Other chronic pain: Secondary | ICD-10-CM | POA: Insufficient documentation

## 2018-04-28 DIAGNOSIS — M542 Cervicalgia: Secondary | ICD-10-CM | POA: Insufficient documentation

## 2018-04-28 MED ORDER — SODIUM CHLORIDE 0.9 % IV BOLUS *I*
1000.0000 mL | Freq: Once | Status: AC
Start: 2018-04-28 — End: 2018-04-28
  Administered 2018-04-28: 1000 mL via INTRAVENOUS

## 2018-04-28 MED ORDER — METOCLOPRAMIDE HCL 5 MG/ML IJ SOLN *I*
10.0000 mg | Freq: Once | INTRAMUSCULAR | Status: AC
Start: 2018-04-28 — End: 2018-04-28
  Administered 2018-04-28: 10 mg via INTRAVENOUS
  Filled 2018-04-28: qty 2

## 2018-04-28 MED ORDER — KETOROLAC TROMETHAMINE 30 MG/ML IJ SOLN *I*
30.0000 mg | Freq: Once | INTRAMUSCULAR | Status: AC
Start: 2018-04-28 — End: 2018-04-28
  Administered 2018-04-28: 30 mg via INTRAVENOUS
  Filled 2018-04-28: qty 1

## 2018-04-28 MED ORDER — IBUPROFEN 200 MG PO TABS *I*
600.0000 mg | ORAL_TABLET | Freq: Four times a day (QID) | ORAL | 0 refills | Status: DC | PRN
Start: 2018-04-28 — End: 2019-03-20

## 2018-04-28 MED ORDER — LIDOCAINE 5 % EX PTCH *I*
1.0000 | MEDICATED_PATCH | Freq: Once | CUTANEOUS | Status: DC
Start: 2018-04-28 — End: 2018-04-29
  Administered 2018-04-28: 1 via TRANSDERMAL
  Filled 2018-04-28: qty 1

## 2018-04-28 MED ORDER — ACETAMINOPHEN 500 MG PO TABS *I*
1000.0000 mg | ORAL_TABLET | Freq: Four times a day (QID) | ORAL | 0 refills | Status: DC | PRN
Start: 2018-04-28 — End: 2022-08-22

## 2018-04-28 MED ORDER — DIPHENHYDRAMINE HCL 50 MG/ML IJ SOLN *I*
25.0000 mg | Freq: Once | INTRAMUSCULAR | Status: AC
Start: 2018-04-28 — End: 2018-04-28
  Administered 2018-04-28: 25 mg via INTRAVENOUS
  Filled 2018-04-28: qty 1

## 2018-04-28 MED ORDER — LIDOCAINE 5 % EX PTCH *I*
1.0000 | MEDICATED_PATCH | CUTANEOUS | 0 refills | Status: AC
Start: 2018-04-28 — End: 2018-05-28

## 2018-04-28 NOTE — Discharge Instructions (Signed)
Follow up with Neurology as recommended by your PCP.  Use the medications prescribed to help with your neck and back pain, as well as your migraines.
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
## 2018-04-28 NOTE — ED Notes (Signed)
The patient states she feels better and would like to just go home. MD states okay to discharge home.
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
## 2018-05-10 ENCOUNTER — Ambulatory Visit: Payer: Self-pay

## 2018-05-10 NOTE — Progress Notes (Signed)
Adult Partial Hospitalization Program  Missed/Cancelled Appointment Note     Name: Casey Hughes  MRN: [id number]   DOB: 1990-10-29    Date of Scheduled Service: 05/10/2018    Ms. Pearce cancelled today's appointment with less than 24 hours notice.  Writer has spoken with the patient by phone.  The patient stated the reason for today's absence is she started a new medication and just woke up. Pt was rescheduled with writer for 05/14/18 at 1pm. .

## 2018-05-11 ENCOUNTER — Other Ambulatory Visit: Payer: Self-pay | Admitting: General Practice

## 2018-05-11 ENCOUNTER — Ambulatory Visit
Admission: RE | Admit: 2018-05-11 | Discharge: 2018-05-11 | Disposition: A | Discharge status: Home / Self Care | Payer: Medicaid Other | Source: Ambulatory Visit | Attending: General Practice | Admitting: General Practice

## 2018-05-11 ENCOUNTER — Telehealth: Payer: Self-pay | Admitting: Urology

## 2018-05-11 DIAGNOSIS — Z87442 Personal history of urinary calculi: Secondary | ICD-10-CM

## 2018-05-11 DIAGNOSIS — Q631 Lobulated, fused and horseshoe kidney: Secondary | ICD-10-CM

## 2018-05-11 NOTE — Telephone Encounter (Signed)
Called and spoke to pt, scheduled her with Dr. Reynolds Bowl in [place] for 10/28 @ 10:30

## 2018-05-11 NOTE — Telephone Encounter (Unsigned)
Copied from CRM [id number]. Topic: Appointments - Schedule Appointment  >> May 11, 2018 12:55 PM Mayra Reel D wrote:  [facility]// [facility] called, patient is scheduled for an NPV on:    Date: 12/10  Provider: Houston Siren  Diagnosis/Symptoms: Urinary Calculi, nausea vomiting,low back pain     Is this appointment is over 2 weeks out? yes  Does the patient want a sooner appointment if available? yes  Is the patient ok with waiting until the date scheduled to be seen? no    The patient can be reached at [phone] if necessary.  Marland Kitchen

## 2018-05-14 ENCOUNTER — Ambulatory Visit: Payer: Medicaid Other | Attending: Psychiatry

## 2018-05-14 DIAGNOSIS — F1490 Cocaine use, unspecified, uncomplicated: Secondary | ICD-10-CM | POA: Insufficient documentation

## 2018-05-14 DIAGNOSIS — F410 Panic disorder [episodic paroxysmal anxiety] without agoraphobia: Secondary | ICD-10-CM | POA: Insufficient documentation

## 2018-05-14 DIAGNOSIS — F4310 Post-traumatic stress disorder, unspecified: Secondary | ICD-10-CM | POA: Insufficient documentation

## 2018-05-14 DIAGNOSIS — F603 Borderline personality disorder: Secondary | ICD-10-CM

## 2018-05-14 DIAGNOSIS — F1420 Cocaine dependence, uncomplicated: Secondary | ICD-10-CM

## 2018-05-14 DIAGNOSIS — Z79899 Other long term (current) drug therapy: Secondary | ICD-10-CM | POA: Insufficient documentation

## 2018-05-14 DIAGNOSIS — F411 Generalized anxiety disorder: Secondary | ICD-10-CM | POA: Insufficient documentation

## 2018-05-14 DIAGNOSIS — F331 Major depressive disorder, recurrent, moderate: Secondary | ICD-10-CM | POA: Insufficient documentation

## 2018-05-14 HISTORY — DX: Post-traumatic stress disorder, unspecified: F43.10
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
## 2018-05-21 ENCOUNTER — Ambulatory Visit: Payer: Self-pay | Admitting: Urology

## 2018-05-31 ENCOUNTER — Ambulatory Visit: Payer: Self-pay | Admitting: Urology

## 2018-06-01 ENCOUNTER — Ambulatory Visit
Admission: AD | Admit: 2018-06-01 | Discharge: 2018-06-01 | Disposition: A | Discharge status: Home / Self Care | Payer: Medicaid Other | Source: Ambulatory Visit | Attending: Family Medicine | Admitting: Family Medicine

## 2018-06-01 DIAGNOSIS — J069 Acute upper respiratory infection, unspecified: Secondary | ICD-10-CM | POA: Insufficient documentation

## 2018-06-01 DIAGNOSIS — F17210 Nicotine dependence, cigarettes, uncomplicated: Secondary | ICD-10-CM | POA: Insufficient documentation

## 2018-06-01 DIAGNOSIS — H6502 Acute serous otitis media, left ear: Secondary | ICD-10-CM | POA: Insufficient documentation

## 2018-06-01 DIAGNOSIS — J208 Acute bronchitis due to other specified organisms: Secondary | ICD-10-CM

## 2018-06-01 DIAGNOSIS — J209 Acute bronchitis, unspecified: Secondary | ICD-10-CM | POA: Insufficient documentation

## 2018-06-01 MED ORDER — ALBUTEROL SULFATE (2.5 MG/3ML) 0.083% IN NEBU *I*
INHALATION_SOLUTION | RESPIRATORY_TRACT | Status: AC
Start: 2018-06-01 — End: 2018-06-01
  Filled 2018-06-01: qty 3

## 2018-06-01 MED ORDER — ALBUTEROL SULFATE (2.5 MG/3ML) 0.083% IN NEBU *I*
2.5000 mg | INHALATION_SOLUTION | Freq: Once | RESPIRATORY_TRACT | Status: AC
Start: 2018-06-01 — End: 2018-06-01
  Administered 2018-06-01: 14:00:00 2.5 mg via RESPIRATORY_TRACT
  Filled 2018-06-01: qty 3

## 2018-06-01 MED ORDER — PREDNISONE 20 MG PO TABS *I*
40.0000 mg | ORAL_TABLET | Freq: Every day | ORAL | 0 refills | Status: AC
Start: 2018-06-01 — End: 2018-06-06

## 2018-06-01 MED ORDER — IPRATROPIUM BROMIDE 0.02 % IN SOLN *I*
RESPIRATORY_TRACT | Status: AC
Start: 2018-06-01 — End: 2018-06-01
  Filled 2018-06-01: qty 2.5

## 2018-06-01 MED ORDER — AMOXICILLIN 875 MG PO TABS *I*
875.0000 mg | ORAL_TABLET | Freq: Two times a day (BID) | ORAL | 0 refills | Status: AC
Start: 2018-06-01 — End: 2018-06-11

## 2018-06-01 MED ORDER — IPRATROPIUM BROMIDE 0.02 % IN SOLN *I*
500.0000 ug | Freq: Once | RESPIRATORY_TRACT | Status: AC
Start: 2018-06-01 — End: 2018-06-01
  Administered 2018-06-01: 14:00:00 500 ug via RESPIRATORY_TRACT
  Filled 2018-06-01: qty 2.5

## 2018-06-01 NOTE — Discharge Instructions (Addendum)
-  No smoking as this is an irritant to the ears, nose, throat and lungs which causes swelling, delays healing and increases cough/ congestion.  -tylenol/ motrin for pain  -drink lots of fluids  -sudafed x 3 days as a ear decongestant  -take claritan or zyrtec for nasal symptoms  -mucinex dm or delsym for cough  -use your inhaler for cough, wheezing and shortness of breath.

## 2018-06-01 NOTE — ED Triage Notes (Signed)
C/o left ear pain. Sinus pressure cough and chest congestion . Started about 5 days ago. Pt had been taking mucinex  and dayquil.     Triage Note   Osborn Coho, LPN
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
## 2018-06-21 ENCOUNTER — Emergency Department
Admission: EM | Admit: 2018-06-21 | Discharge: 2018-06-21 | Disposition: A | Discharge status: Home / Self Care | Payer: Medicaid Other | Source: Ambulatory Visit | Attending: Emergency Medicine | Admitting: Emergency Medicine

## 2018-06-21 DIAGNOSIS — M545 Low back pain: Secondary | ICD-10-CM | POA: Insufficient documentation

## 2018-06-21 DIAGNOSIS — S300XXA Contusion of lower back and pelvis, initial encounter: Secondary | ICD-10-CM

## 2018-06-21 MED ORDER — KETOROLAC TROMETHAMINE 30 MG/ML IJ SOLN *I*
60.0000 mg | Freq: Once | INTRAMUSCULAR | Status: DC
Start: 2018-06-21 — End: 2018-06-21

## 2018-06-21 MED ORDER — KETOROLAC TROMETHAMINE 60 MG/2ML IM SOLN *I*
60.0000 mg | Freq: Once | INTRAMUSCULAR | Status: AC
Start: 2018-06-21 — End: 2018-06-21
  Administered 2018-06-21: 60 mg via INTRAMUSCULAR
  Filled 2018-06-21: qty 2

## 2018-06-21 NOTE — ED Triage Notes (Signed)
Patient fell down 12 steps onto back, now offers complaint of lower back pain, this happen this afternoon.  Patient appears uncomfortable, in no acute distress at this time.    Triage Note   Verdis PrimeAnn Holger Sokolowski, RN

## 2018-06-21 NOTE — Discharge Instructions (Signed)
Use ibuprofen, 600 mg, every 6 hours for pain and stiffness.

## 2018-06-21 NOTE — ED Provider Notes (Signed)
History     Chief Complaint   Patient presents with    Back Pain     27 year old female with history of low back and neck pain presents with increased low back pain after a fall down stairs, occurring approximately 6 hours ago.  Patient states she lost her balance, sliding down the stairs on her back.  She denies any neurologic changes, loss of bowel or bladder incontinence, weakness.  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    Renal calculi N20.0    SVT (supraventricular tachycardia) I47.1    Fibromyalgia M79.7    Positive ANA (antinuclear antibody) R76.8    Major depressive disorder, recurrent, moderate F33.1    PTSD (post-traumatic stress disorder) F43.10            Past Surgical History:   Procedure Laterality Date    APPENDECTOMY      BUNIONECTOMY      COLONOSCOPY      lithotripsy       Family History   Problem Relation Age of Onset    Anxiety disorder Mother     Arthritis Mother     Cancer Mother     Depression Mother     Emphysema Mother     Arthritis Father           Social History     Tobacco Use    Smoking status: Current Every Day Smoker     Packs/day: 0.50     Years: 5.00     Pack years: 2.50     Types: Cigarettes     Smokeless tobacco: Never Used   Substance Use Topics    Alcohol use: No     Comment: denies    Drug use: No     Living Situation     Questions Responses    Patient lives with Family    Homeless No    Caregiver for other family member     External Services     Employment     Domestic Violence Risk                 Review of Systems   Review of Systems   Constitutional: Negative.    HENT: Negative.    Eyes: Negative.    Respiratory: Negative.    Gastrointestinal:        No incontinence   Genitourinary: Negative for difficulty urinating.   Musculoskeletal: Negative.  Skin: Negative.    Neurological: Negative.    Psychiatric/Behavioral: Negative.        Physical Exam     Triage Vitals  Triage Start: Start, (06/21/18 1801)   First Recorded BP: 137/75, Resp: 24, Temp: 36.2 C (97.2 F), Temp src: TEMPORAL Oxygen Therapy SpO2: 92 %, Oximetry Source: Lt Hand, O2 Device: None (Room air),   Heart Rate (via Pulse Ox): (!) 135, (06/21/18 1805).      Physical Exam  Constitutional:       Appearance: Normal appearance.   HENT:      Head: Normocephalic and atraumatic.      Nose: Nose normal.      Mouth/Throat:      Mouth: Mucous membranes are dry.   Eyes:      Extraocular Movements: Extraocular movements intact.      Pupils: Pupils are equal, round, and reactive to light.   Neck:      Musculoskeletal: Normal range of motion and neck supple.   Cardiovascular:      Rate and Rhythm: Normal rate and regular rhythm.      Pulses: Normal pulses.   Abdominal:      General: Abdomen is flat. Bowel sounds are normal.      Palpations: Abdomen is soft.   Musculoskeletal:      Comments: mild tenderness lumbar spine without appreciable ecchymosis, abrasion, swelling, spasm, deformity.   Skin:     Capillary Refill: Capillary refill takes less than 2 seconds.   Neurological:      General: No focal deficit present.      Mental Status: She is alert and oriented to person, place, and time.      Comments: normal, symmetrical bilateral patellar  and Achilles reflexes.  No clonus.   Psychiatric:         Mood and Affect: Mood normal.         Behavior: Behavior normal.         Medical Decision Making     Assessment:  Well-appearing, neurologically intact female with low back pain after a fall downstairs.she has been ambulating well, without any evidence of hematuria.    Differential diagnosis:  Contusion, doubt fracture.    Plan:  Initially I planned to order x-rays and given IM Toradol.  However, patient declined any imaging given her concerns over multiple previous imaging modalities.  As I have low suspicion for fracture, will empirically treat, have recommended follow-up with PMD.    ED Course and Disposition:  Analgesia given, work restrictions, PMD follow-up.       ED Course as of Jun 21 1920   Thu Jun 21, 2018   1610 Patient refuses x-rays I recommended.  She denies any gross hematuria.  I will prescribe I am Toradol, work restrictions.  Tachycardia resolved at time of my eval.          Lytle Michaels, MD          Lytle Michaels, MD  06/21/18 1924

## 2018-07-03 ENCOUNTER — Ambulatory Visit: Payer: Self-pay | Admitting: Urology

## 2018-07-07 ENCOUNTER — Encounter: Payer: Self-pay | Admitting: Obstetrics and Gynecology

## 2018-07-07 ENCOUNTER — Emergency Department
Admission: EM | Admit: 2018-07-07 | Discharge: 2018-07-07 | Disposition: A | Discharge status: Home / Self Care | Payer: Medicaid Other | Source: Ambulatory Visit | Attending: Psychiatry | Admitting: Psychiatry

## 2018-07-07 DIAGNOSIS — F603 Borderline personality disorder: Secondary | ICD-10-CM | POA: Insufficient documentation

## 2018-07-07 DIAGNOSIS — F4310 Post-traumatic stress disorder, unspecified: Secondary | ICD-10-CM | POA: Insufficient documentation

## 2018-07-07 DIAGNOSIS — F1990 Other psychoactive substance use, unspecified, uncomplicated: Secondary | ICD-10-CM

## 2018-07-07 DIAGNOSIS — F32A Depression, unspecified: Secondary | ICD-10-CM

## 2018-07-07 DIAGNOSIS — S61512A Laceration without foreign body of left wrist, initial encounter: Secondary | ICD-10-CM

## 2018-07-07 DIAGNOSIS — F4325 Adjustment disorder with mixed disturbance of emotions and conduct: Secondary | ICD-10-CM | POA: Insufficient documentation

## 2018-07-07 DIAGNOSIS — R45851 Suicidal ideations: Secondary | ICD-10-CM | POA: Insufficient documentation

## 2018-07-07 DIAGNOSIS — F329 Major depressive disorder, single episode, unspecified: Secondary | ICD-10-CM | POA: Insufficient documentation

## 2018-07-07 DIAGNOSIS — S51812A Laceration without foreign body of left forearm, initial encounter: Secondary | ICD-10-CM

## 2018-07-07 DIAGNOSIS — F411 Generalized anxiety disorder: Secondary | ICD-10-CM | POA: Insufficient documentation

## 2018-07-07 LAB — ACETAMINOPHEN LEVEL: Acetaminophen: <5 ug/mL

## 2018-07-07 LAB — BASIC METABOLIC PANEL
Anion Gap: 15 (ref 7–16)
CO2: 23 mmol/L (ref 20–28)
Calcium: 9 mg/dL (ref 8.8–10.2)
Chloride: 104 mmol/L (ref 96–108)
Creatinine: 0.56 mg/dL (ref 0.51–0.95)
GFR,Black: 148 *
GFR,Caucasian: 128 *
Glucose: 97 mg/dL (ref 60–99)
Lab: 8 mg/dL (ref 6–20)
Potassium: 4 mmol/L (ref 3.3–5.1)
Sodium: 142 mmol/L (ref 133–145)

## 2018-07-07 LAB — HOLD LAVENDER

## 2018-07-07 LAB — DRUG SCREEN CHEMICAL DEPENDENCY, URINE
Amphetamine,UR: POSITIVE
Amphetamine,UR: POSITIVE
Benzodiazepinen,UR: NEGATIVE
Benzodiazepinen,UR: NEGATIVE
Cocaine/Metab,UR: NEGATIVE
Cocaine/Metab,UR: NEGATIVE
Opiates,UR: NEGATIVE
Opiates,UR: NEGATIVE
THC Metabolite,UR: POSITIVE
THC Metabolite,UR: POSITIVE

## 2018-07-07 LAB — PREGNANCY TEST, SERUM: Preg,Serum: NEGATIVE

## 2018-07-07 LAB — SALICYLATE LEVEL: Salicylate: <3 mg/dL — ABNORMAL LOW (ref 15.0–30.0)

## 2018-07-07 LAB — ETHANOL: Ethanol: <10 mg/dL (ref 0–9)

## 2018-07-07 MED ORDER — SODIUM CHLORIDE 0.9 % FLUSH FOR PUMPS *I*
0.0000 mL/h | INTRAVENOUS | Status: DC | PRN
Start: 2018-07-07 — End: 2018-07-08

## 2018-07-07 MED ORDER — TRAZODONE HCL 50 MG PO TABS *I*
50.0000 mg | ORAL_TABLET | Freq: Once | ORAL | Status: DC
Start: 2018-07-07 — End: 2018-07-08

## 2018-07-07 MED ORDER — SODIUM CHLORIDE 0.9 % IV BOLUS *I*
1000.0000 mL | Freq: Once | Status: AC
Start: 2018-07-07 — End: 2018-07-07
  Administered 2018-07-07: 1000 mL via INTRAVENOUS

## 2018-07-07 MED ORDER — DEXTROSE 5 % FLUSH FOR PUMPS *I*
0.0000 mL/h | INTRAVENOUS | Status: DC | PRN
Start: 2018-07-07 — End: 2018-07-08

## 2018-07-07 NOTE — ED Notes (Signed)
Pt asking to go home. Write explains she will be going to [facility] after blood work results and she is medically cleared. Pt verbalized understand, however continues to ask if she can leave. Resident aware.

## 2018-07-07 NOTE — Discharge Instructions (Signed)
[facility] Discharge Instructions    Discharge Date: 07/07/2018    Discharge Time:   7:20pm    Follow-ups:  Please follow up with your outpatient providers as scheduled. Please discuss with outpatient therapist the benefit to being referred to the partial hospitalization program.       Level of outreach indicated if patient fails new intake or COPS (Comprehensive Outpatient Psychiatric Service) appointment: Routine Program Follow-up    When to call for help:    Call your psychiatric outpatient provider if experiencing any of these symptoms: increased irritability, sleep changes, appetite changes, energy changes, thoughts to harm yourself or others, anxiety, fear, auditory or visual hallucinations.  [facility] Helpline (24 hours/7 days) [phone] Consulting civil engineer)  Mobile Crisis team: [phone]    General Instructions:  Other written information given to the patient: Yes work note  Return to [employer] on: 07/08/18          The above information has been discussed with me and I have received a copy.  I understand that I am advised to follow the instructions given to me to appropriately care for my condition.      Safety Plan      Step 1:  Warning signs (thoughts, images, mood, situation, behaviors) that a crisis may be developing:   1. Music bothers me in the car when driving  2. Isolating self  3. House is a mess  4. Crying all the time      Step 2:  Internal Coping Strategies - Things I can do to take my mind off my problems without contacting another person   1. Coloring  2. Reading  3. Music  4. My dog  5. Netflix      Step 3:  People and Social Settings that provide distraction   1. Name: Casey Hughes Body                                Phone:   2. Name: Roommate- Casey Hughes                                 Phone:   3. Place: Home                              4. Place:       Step 4:  People whom I can ask for help   1. Name: Casey Hughes                             Phone:   2. Name: Mobile Crisis                              Phone:   3. Name: PNP- Casey Hughes                                Phone:       Step 5:  Professional or agencies I can contact during a crisis   1. Clinician Name: Casey Hughes               Phone #: [phone]  2. Local Urgent Care/Emergency Department: [facility]  3. Suicide Prevention Hotline Phone: 1-800-273-TALK (8255)  4. Mobile Crisis Team: [phone], [phone]      Step 6:  Making the environment safe   1. Removing  sharps  2.

## 2018-07-07 NOTE — ED Notes (Signed)
Pt with SI and self inflicted lacs to L wrist. Pt with hx cutting and depression. Denies drugs or ETOH today. Ambulated to bathroom with steady gait and changed into gown and green socks.

## 2018-07-07 NOTE — CPEP Notes (Signed)
[facility] Charge Nurse Note    Report received from: RN, ambulatory, voluntarily accompanied by patient alone.    Recent breakup.  Cut left wrist in SA.  Did not require sutures.  Labs negative.  Denies drugs/ETOH.  Denies ingestion.    Chief Complaint   Patient presents with    Suicidal    Laceration       Allergies as of 07/07/2018 - Up to Date 07/07/2018   Allergen Reaction Noted    Cipro xr  02/16/2010       Past Medical History:   Diagnosis Date    Anxiety     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    Depression     Endometriosis 2011    Fibromyalgia     Hypothyroid     Migraines     Seizures     last 03/2017 in context abrupt withdrawl from medication    SVT (supraventricular tachycardia) 2013     Substance use: denies    Ingestion: Denies    Medical clearance: Yes: medically cleared: treatment recieved labs    Vital signs:  Last Filed Vitals    07/07/18 0901   BP: 139/65   Pulse: 105   Resp: 22   Temp: 36.3 C (97.3 F)   SpO2: 99%     Last Nursing documented pain:  0-10 Scale: 10 (07/07/18 0901)    Pre-Arrival Notifications: No    Patient location: ems    Melene Planhristopher G Jay Kempe, RN, 11:43 AM

## 2018-07-07 NOTE — ED Triage Notes (Signed)
States she tried to harm herself by cutting her left wrist. States "I want to die." Multiple superficial cuts noted to left wrist. Bleeding controlled. Denies drugs/ETOH.        Triage Note   Fara ChuteELIZABETH B Minka Knight, RN

## 2018-07-07 NOTE — First Provider Contact (Addendum)
CPE[facility] Provider Initial Contact     [facility] initial provider evaluation performed by me on 07/07/18 at 3:10 PM    Chief Complaint:     Chief Complaint   Patient presents with    Suicidal    Laceration     Patient presents with above complaint.  She is found sleeping comfortably in [facility].  She reports coming in voluntarily for SI, but now states that she has no thoughts of harming herself and tearfully requests to be discharged.  She denies SOB, CP, abdominal pain.  She reports that her roommate (who is present in C[facility]) will help her maintain safety.   The stated that she has work at [employer] tomorrow and needs discharge.  She also requests clonazepam, which she states is in her purse.  She does not know the dose.    Vital Signs   Reviewed:  Last Filed Vitals    07/07/18 0901   BP: 139/65   Pulse: 105   Resp: 22   Temp: 36.3 C (97.3 F)   SpO2: 99%       Initial [facility] Plan:   collaterals, DBT worksheets, medications and monitor and document behavior.  Patient requires further evaluation.    No other immediate concerns.      Michelene GardenerLarry Clifton Jamond Neels, MD, 07/07/2018, 3:25 PM             Geddy Boydstun, Derrill KayLarry Clifton, MD  Resident  07/07/18 [id number]

## 2018-07-07 NOTE — CPEP Notes (Signed)
Patient Name: Casey Hughes Birth Date: [birth year]   Address: [address] [place], [place] [id number] Sex: Female   Rx Written Rx Dispensed Drug Quantity Days Supply Prescriber Name Payment Method Dispenser   06/10/2018 06/12/2018 dextroamp-amphet er 30 mg cap  30 30 Barbette MerinoYonker, Terry [facility] [id number]   05/08/2018 05/09/2018 dextroamp-amphet er 30 mg cap  30 30 Barbette MerinoYonker, Terry [facility] [phone]   03/21/2018 03/27/2018 dextroamp-amphet er 20 mg cap  30 30 Barbette MerinoYonker, Terry Jean [facility] [phone]   03/21/2018 03/23/2018 clonazepam 0.5 mg tablet  30 30 Barbette MerinoYonker, Terry Jean [facility] [phone]   03/08/2018 03/11/2018 dextroamp-amphet er 10 mg cap  14 14 Barbette MerinoYonker, Terry Jean [facility] [phone]   12/25/2017 12/25/2017 clonazepam 0.5 mg tablet  14 7 Meaker, Gwenlyn FudgePhilip S MD [facility] [phone]          Hasna Stefanik, Derrill KayLarry Clifton, MD  Resident  07/07/18 [phone]

## 2018-07-07 NOTE — CPEP Notes (Signed)
-   A/O x 3; tearful but cooperative   - Ambulates independently in the hall   - PO intake 50% for meals  - No c/o pain  - Remained continent  - Able to make all needs known.   Dorena DewKelsi Khadir Roam, RN

## 2018-07-07 NOTE — ED Notes (Signed)
07/07/18 1925   C[facility] Summary of Services   Safety precautions implemented personal belongings secured   Case consultation/discussion involving attending;registered nurse;social worker   Crisis intervention and safety planning involving verbal intervention;safety plan #1 developed (comment);access to firearms discussed and denied   Collateral information obtained from member of household   Referral offered, accepted and completed for  No additional referrals needed   Referral(s) offered but declined by patient for No referrals declined
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
## 2018-07-07 NOTE — Progress Notes (Signed)
Writer spoke w/ pt mother on the phone w/ pt consent. Status update. Mother stated that if pt is willing she would like to have her stay with her after d/c.  Dorena DewKelsi Chitara Clonch, RN

## 2018-07-07 NOTE — ED Provider Notes (Addendum)
History     Chief Complaint   Patient presents with    Suicidal    Laceration     HPI     27 yo F with history of depression, anxiety, PTSD, borderline personality disorder, fibromyalgia, and history of multiple suicide attempts, who presents with a suicide attempt by cutting her left wrist with a razor blade. She reports that her boyfriend left her last night and since then has wanted to kill herself.  She cut her left wrist this morning at around 7 am and was found by roommate. She reports losing a lot of blood.  She has also been vomiting recently and had nausea- some of which is induced vomiting.  She currently reports some numbness and tingling in her hands and some dizziness and lightheadedness. Upon further questioning, she admits to banging the posterior side of her head on the wall just now when she went to change in the bathroom. She denies vision changes, headache, chest pain, shortness of breath, and abdominal pain.  She is recovering from bronchitis and still has a residual cough.  She had an IUD placed three years ago and reports recently she has had abnormal bleeding and concern for pregnancy.      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    Renal calculi N20.0    SVT (supraventricular tachycardia) I47.1    Fibromyalgia M79.7    Positive ANA (antinuclear antibody) R76.8    Major depressive disorder, recurrent, moderate F33.1    PTSD  (post-traumatic stress disorder) F43.10            Past Surgical History:   Procedure Laterality Date    APPENDECTOMY      BUNIONECTOMY      COLONOSCOPY      lithotripsy       Family History   Problem Relation Age of Onset    Anxiety disorder Mother     Arthritis Mother     Cancer Mother     Depression Mother     Emphysema Mother     Arthritis Father           Social History     Tobacco Use    Smoking status: Current Every Day Smoker     Packs/day: 0.50     Years: 5.00     Pack years: 2.50     Types: Cigarettes    Smokeless tobacco: Never Used   Substance Use Topics    Alcohol use: No     Comment: denies    Drug use: No     Living Situation     Questions Responses    Patient lives with Family    Homeless No    Caregiver for  other family member     External Services     Employment     Domestic Violence Risk                 Review of Systems   Review of Systems   Constitutional: Negative for chills and fever.   Eyes: Negative for visual disturbance.   Respiratory: Positive for cough. Negative for shortness of breath.    Cardiovascular: Negative for chest pain.   Gastrointestinal: Positive for nausea and vomiting. Negative for abdominal pain, constipation and diarrhea.   Genitourinary: Positive for menstrual problem.   Skin: Positive for wound.        L wrist/distal forearm   Neurological: Positive for dizziness, light-headedness and numbness. Negative for headaches.   Psychiatric/Behavioral: Positive for self-injury and suicidal ideas.       Physical Exam     Triage Vitals  Triage Start: Start, (07/07/18 5409)   First Recorded BP: 139/65, Resp: 22, Temp: 36.3 C (97.3 F), Temp src: TEMPORAL Oxygen Therapy SpO2: 99 %, Oximetry Source: Rt Hand, O2 Device: None (Room air), Heart Rate: 105, (07/07/18 0901)  .  First Pain Reported  0-10 Scale: 10, (07/07/18 0901)       Physical Exam  Constitutional:       Comments: Visibly upset   HENT:      Head: Normocephalic.      Comments: approx 3x1 cm hematoma  posterior occiput left side     Nose: Nose normal.   Eyes:      Extraocular Movements: Extraocular movements intact.      Pupils: Pupils are equal, round, and reactive to light.   Neck:      Musculoskeletal: Normal range of motion.   Cardiovascular:      Rate and Rhythm: Regular rhythm. Tachycardia present.      Heart sounds: No murmur. No friction rub. No gallop.    Pulmonary:      Effort: Pulmonary effort is normal.      Breath sounds: Wheezing present.      Comments: Diffuse mild wheezes  Abdominal:      Palpations: Abdomen is soft.      Comments: Mild generalized tenderness   Skin:     General: Skin is warm.      Capillary Refill: Cannot assess because of nail polish     Comments: Many superficial lacerations to L wrist and distal forearm flexor surface, no active bleeding   Neurological:      Mental Status: She is alert.      Motor: No weakness.      Comments: Decreased V1 sensation bilaterally, rest of CN II-XII intact, decreased sensation to palpation L hand   Psychiatric:      Comments: Crying, endorses suicide ideation and attempt         Medical Decision Making     Assessment:  27 yo F with history of depression, anxiety, PTSD, borderline personality disorder, fibromyalgia, and history of multiple suicide attempts, who presents with a suicide attempt by cutting her left wrist.    Differential diagnosis:  Suicide attempt, anemia, marijuana use    Plan:  Orders Placed This Encounter      Pregnancy Test, Serum      Basic metabolic panel      Salicylate level      Ethanol      Hold lavender      Acetaminophen level      Acetaminophen level      Drug  screen chemical dependency, urine      EKG 12 lead (initial)      Insert peripheral IV      ED transfer to CPEP            ED Course and Disposition:  No metabolic alkalosis. Neg salicylate, aspirin and acetaminophen. Neg pregnancy test. Admitted to CPEP.            Pt examined by and discussed with Dr. Abner Greenspan  Resident Attestation:    Patient seen by me on  07/07/2018.    History:  I reviewed this patient, reviewed the resident's note and agree.    Exam:  I examined this patient, reviewed the resident's note and agree.    Decision Making:  I discussed with the resident his/her documented decision making and agree.      Author:  Elliot Gault, MD       Berlinda Last, DO  Resident  07/07/18 1213       Elliot Gault, MD  07/08/18 [phone]

## 2018-07-07 NOTE — First Provider Contact (Signed)
ED First Provider Contact Note    Initial provider evaluation performed by   ED First Provider Contact     Date/Time Event User Comments    07/07/18 [phone] ED First Provider Contact Nieshia Larmon A Initial Face to Face Provider Contact          Vital signs reviewed.    Slashed left wrist as attempt to hurt herself. All wounds superficial  / dressed with petroleum dressing. Denies ingestion to me   Assessment: ems hall for SI     Orders placed:  None     Patient requires further evaluation.     Sergey Ishler, PA, 07/07/2018, 8:57 AM     Carollee Massedhilelli, Aracelly Tencza, PA  07/07/18 [phone]

## 2018-07-07 NOTE — ED Notes (Signed)
07/07/18 1926   Disposition details   Patient is being discharged to community   Family notification done via phone   Name of family member notified mother   Outpatient provider notification of disposition via phone message   Name of outpatient provider [facility]   Living situation: notification of disposition in person   Transportation arranged via friend   Transporting friend name Denyse AmassCorey, roommate   Patient belongings  given to patient   Medications NA- patient has none   Valuables NA- patient has none
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
## 2018-07-13 LAB — AMPHETAMINE,QNT,UR
AMPHETAMINES, QNT, UR: 1393 ng/mL
AMPHETAMINES, QNT, UR: 1461 ng/mL
MDA,QNT,UR: <200 ng/mL
MDA,QNT,UR: <200 ng/mL
MDEA,QNT,UR: <200 ng/mL
MDEA,QNT,UR: <200 ng/mL
MDMA,QNT,UR: <200 ng/mL
MDMA,QNT,UR: <200 ng/mL
METHAMPHETAMINES,QNT,UR: <200 ng/mL
METHAMPHETAMINES,QNT,UR: <200 ng/mL
PHENTERMINE,QNT,UR: <200 ng/mL
PHENTERMINE,QNT,UR: <200 ng/mL

## 2018-07-13 LAB — THC METABOLITE,QNT,UR
THC METABOLITE,QNT,UR: >500 ng/mL
THC METABOLITE,QNT,UR: >500 ng/mL

## 2018-08-08 NOTE — Progress Notes (Signed)
Adult Partial Hospitalization Program  Clinical Management Note     Casey Hughes  [id number]  08/08/2018    Triage Coordinator Note  Referral for the Adult Partial Hospital Program was received on 07/12/2018 from Evangeline Dakin.  Writer has left multiple messages for patient to schedule PHP intake appointment with no response from patient.  Referral now closed.  Message left for Kelli Churn, NP with this update.

## 2018-10-05 ENCOUNTER — Encounter: Payer: Self-pay | Admitting: Emergency Medicine

## 2018-10-05 ENCOUNTER — Ambulatory Visit
Admission: AD | Admit: 2018-10-05 | Discharge: 2018-10-05 | Disposition: A | Discharge status: Home / Self Care | Payer: Medicaid Other | Source: Ambulatory Visit | Attending: Urgent Care | Admitting: Urgent Care

## 2018-10-05 DIAGNOSIS — Y9289 Other specified places as the place of occurrence of the external cause: Secondary | ICD-10-CM | POA: Insufficient documentation

## 2018-10-05 DIAGNOSIS — E860 Dehydration: Secondary | ICD-10-CM | POA: Insufficient documentation

## 2018-10-05 DIAGNOSIS — Y9389 Activity, other specified: Secondary | ICD-10-CM | POA: Insufficient documentation

## 2018-10-05 DIAGNOSIS — S0184XA Puncture wound with foreign body of other part of head, initial encounter: Secondary | ICD-10-CM | POA: Insufficient documentation

## 2018-10-05 DIAGNOSIS — T148XXA Other injury of unspecified body region, initial encounter: Secondary | ICD-10-CM

## 2018-10-05 DIAGNOSIS — W458XXA Other foreign body or object entering through skin, initial encounter: Secondary | ICD-10-CM | POA: Insufficient documentation

## 2018-10-05 DIAGNOSIS — R5383 Other fatigue: Secondary | ICD-10-CM | POA: Insufficient documentation

## 2018-10-05 DIAGNOSIS — L089 Local infection of the skin and subcutaneous tissue, unspecified: Secondary | ICD-10-CM | POA: Insufficient documentation

## 2018-10-05 DIAGNOSIS — Y998 Other external cause status: Secondary | ICD-10-CM | POA: Insufficient documentation

## 2018-10-05 LAB — POCT INFLUENZA
Influenza A, POCT: NEGATIVE
Influenza B, POCT: NEGATIVE
Lot #: 705747

## 2018-10-05 MED ORDER — SULFAMETHOXAZOLE-TRIMETHOPRIM 800-160 MG PO TABS *I*
1.0000 | ORAL_TABLET | Freq: Two times a day (BID) | ORAL | 0 refills | Status: AC
Start: 2018-10-05 — End: 2018-10-12

## 2018-10-05 NOTE — ED Triage Notes (Signed)
Patient presents with headache, dizziness and feeling hot and cold that started last night.  Patient states she left a tampon in for 2 days.  Patient was able to get it out.        Triage Note   Roosevelt Locks, LPN

## 2018-10-05 NOTE — Discharge Instructions (Addendum)
You need to improve oral hydration.  Oral antibiotic has begun for concern of facial erythema around the piercing.  You should go to emergency room for increased weakness, fever or persistent  dizziness , rash or shortness of breath.  Encourage you to follow up to have piercing removed

## 2018-10-05 NOTE — ED Notes (Signed)
Orthostatics per Der. Birdie Riddle. Laying pulse 85 blood pressure 122/68. Sitting pulse 94 blood pressure 98/58. Standing pulse 112, unable to assess blood pressure due to patient complaining of dizziness and lightheadedness.
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
## 2018-11-15 ENCOUNTER — Other Ambulatory Visit
Admission: RE | Admit: 2018-11-15 | Discharge: 2018-11-15 | Disposition: A | Discharge status: Home / Self Care | Payer: Medicaid Other | Source: Ambulatory Visit

## 2018-11-15 ENCOUNTER — Other Ambulatory Visit
Admission: RE | Admit: 2018-11-15 | Discharge: 2018-11-15 | Disposition: A | Discharge status: Home / Self Care | Payer: Medicaid Other | Source: Ambulatory Visit | Attending: Pediatrics | Admitting: Pediatrics

## 2018-11-15 DIAGNOSIS — M545 Low back pain: Secondary | ICD-10-CM | POA: Insufficient documentation

## 2018-11-15 DIAGNOSIS — R300 Dysuria: Secondary | ICD-10-CM | POA: Insufficient documentation

## 2018-11-16 LAB — URINALYSIS WITH MICROSCOPIC
Blood,UA: NEGATIVE
Ketones, UA: NEGATIVE
Leuk Esterase,UA: NEGATIVE
Nitrite,UA: NEGATIVE
Protein,UA: NEGATIVE mg/dL
RBC,UA: 3 /hpf — AB (ref 0–2)
Specific Gravity,UA: 1.023 (ref 1.002–1.030)
WBC,UA: 5 /hpf (ref 0–5)
pH,UA: 6 (ref 5.0–8.0)

## 2018-11-16 LAB — AEROBIC CULTURE: Aerobic Culture: 0

## 2018-11-16 LAB — VAGINITIS SCREEN: DNA PROBE: Vaginitis Screen:DNA Probe: 0

## 2019-01-02 ENCOUNTER — Emergency Department
Admission: EM | Admit: 2019-01-02 | Discharge: 2019-01-03 | Disposition: A | Discharge status: Home / Self Care | Payer: Medicaid Other | Source: Ambulatory Visit | Attending: Psychiatry | Admitting: Psychiatry

## 2019-01-02 DIAGNOSIS — F603 Borderline personality disorder: Secondary | ICD-10-CM

## 2019-01-02 DIAGNOSIS — F418 Other specified anxiety disorders: Secondary | ICD-10-CM | POA: Insufficient documentation

## 2019-01-02 DIAGNOSIS — F1910 Other psychoactive substance abuse, uncomplicated: Secondary | ICD-10-CM

## 2019-01-02 DIAGNOSIS — F17210 Nicotine dependence, cigarettes, uncomplicated: Secondary | ICD-10-CM | POA: Insufficient documentation

## 2019-01-02 MED ORDER — OLANZAPINE 5 MG PO TBDP *I*
5.0000 mg | ORAL_TABLET | Freq: Once | ORAL | Status: AC
Start: 2019-01-02 — End: 2019-01-02
  Administered 2019-01-02: 5 mg via ORAL
  Filled 2019-01-02: qty 1

## 2019-01-02 MED ORDER — NICOTINE POLACRILEX 2 MG MT GUM *I*
2.0000 mg | CHEWING_GUM | OROMUCOSAL | Status: DC | PRN
Start: 2019-01-02 — End: 2019-01-03
  Administered 2019-01-02: 2 mg via ORAL
  Filled 2019-01-02: qty 1

## 2019-01-02 NOTE — ED Notes (Signed)
01/02/19 2330   Disposition details   Patient is being discharged to community   Family notification done via phone message   Name of family member notified Duard Brady [place]   Outpatient provider notification of disposition patient has no outpatient provider   Living situation: notification of disposition NA-homeless   Transportation arranged other (comment)   Patient belongings  given to patient   Medications NA- patient has none   Valuables NA- patient has none

## 2019-01-02 NOTE — Discharge Instructions (Signed)
CPEP Discharge Instructions    Discharge Date: 01/02/2019    Discharge Time:   ***    Follow-ups:  [place] [phone]     Contact Care Manager      Level of outreach indicated if patient fails new intake or COPS (Comprehensive Outpatient Psychiatric Service) appointment: Routine Program Follow-up    When to call for help:    Call your psychiatric outpatient provider if experiencing any of these symptoms: increased irritability, sleep changes, appetite changes, energy changes, thoughts to harm yourself or others, anxiety, fear, auditory or visual hallucinations.  [facility] Helpline (24 hours/7 days) [phone] Product/process development scientist)  Mobile Crisis team: [phone]    General Instructions:  Other written information given to the patient: No  Return to Work/School on: 01/02/2019           The above information has been discussed with me and I have received a copy.  I understand that I am advised to follow the instructions given to me to appropriately care for my condition.

## 2019-01-02 NOTE — CPEP Notes (Signed)
CPEP Charge Nurse Note    Report received from: [facility], ambulatory, voluntarily accompanied by patient alone.    Vol for SI. Denies drugs/etoh. Cooperative, VSS.     Chief Complaint   Patient presents with    Suicidal       Allergies as of 01/02/2019 - Up to Date 01/02/2019   Allergen Reaction Noted    Cipro xr Nausea And Vomiting 02/16/2010       Past Medical History:   Diagnosis Date    Anxiety     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    Depression     Endometriosis 2011    Fibromyalgia     Hypothyroid     12/14 denies     Migraines     Seizures     last 03/2017 in context abrupt withdrawl from medication    SVT (supraventricular tachycardia) 2013     Substance use: denies    Ingestion: Denies    Medical clearance: NA    Vital signs:  Last Filed Vitals    01/02/19 2030   BP: (!) 154/103   Pulse: 103   Resp: 18   Temp: 35.9 C (96.6 F)   SpO2: 98%     Last Nursing documented pain:  0-10 Scale: 0 (01/02/19 2030)    Pre-Arrival Notifications: No    Patient location: ems    Chari Manning, RN, 8:31 PM

## 2019-01-02 NOTE — ED Triage Notes (Signed)
Voluntary for suicidal ideation. Denies drugs/ETOH.        Triage Note   Jovan Colligan, RN

## 2019-01-02 NOTE — CPEP Notes (Signed)
Patient Name: Casey Hughes   Birth Date: 20-Oct-1990   Address: [address] [place], [place] [address]   Sex: Female   Rx Written Rx Dispensed Drug Quantity Days Supply Prescriber Name Payment Method Dispenser   11/09/2018 11/15/2018 clonazepam 0.5 mg tablet  7 7 Rosana Fret Medicaid [place] [id number]   10/17/2018 10/25/2018 dextroamp-amphet er 30 mg cap  30 30 Rosana Fret Medicaid [place] [id number]   10/22/2018 10/25/2018 clonazepam 0.5 mg tablet  14 14 Rosana Fret [facility] [id number]   08/27/2018 09/03/2018 dextroamp-amphet er 30 mg cap  30 30 Rosana Fret Medicaid [place] [id number]   08/27/2018 09/03/2018 clonazepam 0.5 mg tablet  30 30 Rosana Fret Medicaid [place] [id number]   07/12/2018 07/27/2018 dextroamp-amphet er 30 mg cap  30 30 Rosana Fret Medicaid [place] [address]   06/10/2018 06/12/2018 dextroamp-amphet er 30 mg cap  30 30 [place], [place] [address]   05/08/2018 05/09/2018 dextroamp-amphet er 30 mg cap  30 30 Rosana Fret Medicaid [place] [address]   03/21/2018 03/27/2018 dextroamp-amphet er 20 mg cap  30 30 Rosana Fret Medicaid [place] [address]   03/21/2018 03/23/2018 clonazepam 0.5 mg tablet  30 30 Rosana Fret Medicaid [place] [address]   03/08/2018 03/11/2018 dextroamp-amphet er 10 mg cap  14 14 [place], [place] [phone]   * - Drugs marked with an asterisk are compound drugs. If the compound drug is made up of more than one controlled substance, then each controlled substance will be a separate row in the table.       Freeman Caldron, MD  01/02/19 2258

## 2019-01-02 NOTE — CPEP Notes (Signed)
CPEP Triage Note    Arrival    Patient is oriented to unit and CPEP evaluation process: Yes  Reviewed cell phone and visitor policies: yes   Reviewed contraband items/milieu safety concerns and confirmed no safety concerns present: yes    Patient is accompanied by: patient alone  Patient under MHA: No    History and Chief Complaint    Reason for current presentation:   28 y.o. F with pphx of adjustment d/o, PTSD, depression, GAD, borderline personality d/o, and substance abuse presents vol for SI. Pt endorsed chronic intermittent SI that has been unchanged in several years. Stated "I've had suicidal thoughts since I was 8." Pt denied any worsening of symptoms with SI, denied intent, plan, or intrusiveness of thoughts. Pt endorsed thoughts of SIB that have worsened past 2 weeks. Pt identified stressor of her boyfriend having medical issues and unable to stay with him. Stated "I brought him to the ED today. He was admitted for gastritis and left AMA. He's all I have and I can't be with him." Endorsed decrease sleep and appetite d/t stress of boyfriends health. Not currently working but is suppose to return to work this Friday at [employer] in [place]. Was previously connected to [facility] but was discharged from services d/t noncompliance with appointments. Denies HI/AVH and does not appear to be responding internally.     Current Mental Health Provider(s): Not currently    Any recent exposure to infestations(lice, scabies, bedbugs, fleas) or other communicable diseases: No    Substance use: denies     Ingestion: Denies    Self-harm: no    Medication Data Collection    Medication data collection: Yes: With Patient (Reliable)    Physical Assessment    Pain assessment: Last Nursing documented pain:  0-10 Scale: 0 (01/02/19 2057)    Last Filed Vitals    01/02/19 2030   BP: (!) 154/103   Pulse: 103   Resp: 18   Temp: 35.9 C (96.6 F)   SpO2: 98%       Medical / Surgical History    PMH:   Past Medical  History:   Diagnosis Date    Anxiety     Asthma     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    Depression     Endometriosis 2011    Fibromyalgia     Hypothyroid     12/14 denies     Migraines     Seizures     last 03/2017 in context abrupt withdrawl from medication    SVT (supraventricular tachycardia) 2013       PSH:   Past Surgical History:   Procedure Laterality Date    APPENDECTOMY      BUNIONECTOMY      COLONOSCOPY      lithotripsy         Review of Systems   Eyes: Negative.    Respiratory: Negative.    Skin: Negative.    Allergic/Immunologic: Negative.    Psychiatric/Behavioral: Positive for dysphoric mood and suicidal ideas. The patient is nervous/anxious.        Any additional concerns: no    Anticipated track: [place], RN, 9:04 PM
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
## 2019-01-02 NOTE — ED Notes (Signed)
01/02/19 2330   CPEP Summary of Services   Safety precautions implemented personal belongings secured   Case consultation/discussion involving attending;registered nurse;social worker   Crisis intervention and safety planning involving verbal intervention;access to firearms discussed and denied;safety plan #1 developed (comment)   Collateral information obtained from family member(s)   Referral offered, accepted and completed for  No additional referrals needed   Referral(s) offered but declined by patient for No referrals declined

## 2019-01-06 ENCOUNTER — Emergency Department
Admission: EM | Admit: 2019-01-06 | Discharge: 2019-01-06 | Disposition: A | Discharge status: Home / Self Care | Payer: Medicaid Other | Source: Ambulatory Visit | Attending: Psychiatry | Admitting: Psychiatry

## 2019-01-06 DIAGNOSIS — R45851 Suicidal ideations: Secondary | ICD-10-CM | POA: Insufficient documentation

## 2019-01-06 DIAGNOSIS — F331 Major depressive disorder, recurrent, moderate: Secondary | ICD-10-CM

## 2019-01-06 HISTORY — DX: Borderline personality disorder: F60.3

## 2019-01-06 MED ORDER — OLANZAPINE 5 MG PO TBDP *I*
5.0000 mg | ORAL_TABLET | Freq: Once | ORAL | Status: AC
Start: 2019-01-06 — End: 2019-01-06
  Administered 2019-01-06: 5 mg via ORAL
  Filled 2019-01-06: qty 1

## 2019-01-06 MED ORDER — NICOTINE POLACRILEX 2 MG MT GUM *I*
2.0000 mg | CHEWING_GUM | OROMUCOSAL | Status: DC | PRN
Start: 2019-01-06 — End: 2019-01-06

## 2019-01-06 NOTE — Discharge Instructions (Signed)
CPEP Discharge Instructions    Discharge Date: 01/06/2019    Discharge Time:       Follow-ups: You have an intake appointment for the adult partial hospitalization program as provided below, if you do not hear from them within the next few days please call the number provided below. Follow up with current providers, and utilize the following resources as necessary.    Appointment With: [facility] - Adult Partial Hospitalization Program   Phone: [phone]  Date: Monday, 01/14/2019  Time: 08:30AM  Location/Instructions:[address], [place] [id number]    Chemical Dependency Resources     How can I tell if I have an alcohol problem?    Alcohol may be harmful for you if it causes a problem in any part of your life. Following are signs that you may have a drinking problem or are alcohol dependent.      Blacking out or forgetting where you were or what you were doing    Drinking to get drunk. Or, feeling like you need to drink more to get the same feeling or "buzz"    Drinking to decrease pain or stress    Drinking more than you had expected to drink    Drinking in a pattern. For example, every day or every week at the same time    Suffering from the effects of alcohol on your daily function    Drinking starts to take over and causes problems in your daily life, such as not showing up for work or driving when you are drunk    Trying to hide how much you drink   Feeling guilty or angry when someone says something about your drinking    Seeing or hearing things that are not there (hallucinating)    Having major personality changes when you drink    Planning activities around drinking    Experiencing seizures (convulsions)    Shaking of your hands if you have not had a drink for a while    Sleeping problems or bad dreams    Sweating, nervousness, confusion, or depression    Thinking a lot about drinking    Trouble having erections     What's the harm?    Not all drinking is harmful. You may  have heard that regular light to moderate drinking (from  drink a day up to 1 drink a day for women and 2 for men) can even be good for the heart. With at-risk or heavy drinking, however, any potential benefits are outweighed by greater risks.    Injuries. Drinking too much increases your chances of being injured or even killed.  Alcohol is a factor, for example, in about 60% of fatal burn injuries, drownings, and homicides; 50% of severe trauma injuries and sexual assaults; and 40% of fatal motor vehicle crashes, suicides, and fatal falls.    Health problems. Heavy drinkers have a greater risk of liver disease, heart disease, sleep disorders, depression, stroke, bleeding from the stomach, sexually transmitted infections from unsafe sex, and several types of cancer. They may also have problems managing diabetes, high blood pressure, and other conditions.    Birth defects. Drinking during pregnancy can cause brain damage and other serious problems in the baby. Because it is not yet known whether any amount of alcohol is safe for a developing baby, women who are pregnant or may become pregnant should not drink.    Alcohol use disorders. Generally known as alcoholism and alcohol abuse, alcohol use disorders  are medical conditions that doctors can diagnose when a patient's drinking causes distress or harm. In the [place], about 18 million people have an alcohol use disorder.     Wellness Hints:     Be honest and open about your alcohol use with your family, close friends, and health care professionals. Ask for and accept their help.   Avoid persons who use and/or abuse alcohol and who try to get you to drink alcohol.    Get the help of a counselor and keep regular appointments.    Eat a normal, well-balanced diet, drink six to eight glasses of water a day, and get plenty of rest.    Replace social activities associated with drinking alcohol with those that do not involve alcohol.    Consider cutting down on  the amount of alcohol you drink and how often you drink it.       OPEN ACCESS CENTER  [address], [place] [address] [phone] OPEN 24 hours/ 7 days week (Spanish Speaking available)  (Se habla Espaol! Proveemos Asesoramiento y United AutoEvaluaciones Clnicas. Referidos para programas mdicos de Detoxificacin, internado (inpatient) y programas Ambulatorios (outpatient). )  [address], [place] [address] [phone] OPEN 7AM to 10PM/ 7 days week (closed for major holidays)  *both locations serve [place], [place], [place], [place], [place], [place], [place], [place] and [place]  Providing help with opioid, alcohol and other substances. Assessments, evaluations and referrals to detox, inpatient and/or outpatient services.    MOBIL TREATMENT OPTIONS:  [facility]   Operated by [place] Council of Alcoholism & Substance Abuse (GCASA)   *hours vary (p) [phone]   Cit[place] & [place] Counties - [facility] Mobile 24/7 Opiod Response Team   [facility] (FLACRA) (p) [phone]     DETOX FACILITIES:  [place]  [address] [place] [address] ([phone]) [phone]   [facility])  [address] Farm[address] 76 (6th floor) [place] [id number] [phone](p) [phone]   FLACRA [facility]([place])- Alcohol Crisis Center   [address] [place] [address]  *Medicaid Managed Care and Private Ins. Only  (p) [phone]   [facility], Alcohol Only)  [address] [place] [address] [phone](p) [phone]   [facility] - Outpatient Only)  [address] [place] [address]  *Walk in Hours M-F 8:30AM  (p) [phone]   [facility])  [address] [place] [address]  www.helio.health Sabrina Howland (intake)  (p) [phone]  [email or web address] .health   [facility]   (Opiates only with Suboxone)  [address] [place] [address]  www.[place].edu (p) [phone] or  [phone]   [facility]  [address] [place] [address]  www.tullyhill.com (online referral available)  (p) [phone] (toll free)  (p) [phone]       INPATIENT PROGRAMS:  [facility]   [address] [place] [phone](p) [phone]   CASA [address]rinity -   [address] [place], [address] [place] [address] ([phone]) [phone] Ext [phone] or   [phone](P) [phone] Ext [phone]     [facility]   [address] [place] [address] ([phone]) [phone]   [facility]   [address] [place] [address] Dorothy Spark(Woodbury Building) (p) [phone]   [facility]  [address] [place] [address] [phone](p) [phone]  [place]  [address] [place] [address] [phone] option 3   [facility]  [address] [place] [place] [address] [phone]   [facility]  [address] [place] [address] [phone]   [facility] - (2 Locations)   [facility]   [address]Jackson [address] [place] [address]  *Walk in Hours 7 days a week 9am-11am    [facility]   [address] [place] [address] (p) [phone] ext. [phone] (self-referral line)        (p) [phone]   [facility])  [address] [place] [address] [phone]   [facility]  [address] [place] [address] (direct phone# [phone])  Application online: www.horizon-health.org (p) [phone]    [facility]  [address] (Floor 3) [place] [address] [phone]    [place]  [address] [place] [address] [phone]   [facility]  [address] Joseph's [place] [address] [phone]   [facility]  [address] [place] [address] [phone]   [facility]   [address] [place] [address] (located on Merrill Lynch campus) (p) [phone]   [facility]  [address] [place] [address] [phone]     [facility]   [address] [place] [address] [phone]   [facility]   [address] [place] [address] ([phone]   [facility]  [address] Rest Haven. [address], [address]16) [place] [address] [phone]   Holiday representative [facility])   [address] [place] [address] [phone] ext [address] [facility]  [address], [place] [address] [phone]   [facility] (2 Locations)   [facility]   [address] [place] [address]    [facility] Retort [facility])   [address] [place] [address] [phone]      [phone]   [facility]   [address] [place] [address]  [phone]   [facility] Inpatient Chemical Dependency Treatment Center   [address] [place] [address] [phone]   [facility]  [address] [place] [address]  www.tullyhill.com (online referral available) (p) [phone] (toll free)  (p) [phone]   [facility] (No Medicaid/Medicare)   [address] [place] [address] [phone]     OUTPATIENT PROGRAMS:  Action for a Better Community  [address] [place] [address]  www.SocialListing.com.br  *Walk in Hours Monday & Wednesday 8:30am-Noon and T &TH 3pm-5pm at   [address] [phone]  Ext. [phone]   [facility] - Comprehensive Alcoholism  [address] [place] [address] [phone]   [place]  [address] [place] [address] [phone] Ext [address] (3  Clinics)   [address]  [place] [address]   [address] [place] [address]   [address]  [place] [address]  *Do not accept Medicare Ins. (p) [phone] ([place])  (p) [phone] ([place])  (p) [phone] [place])   Materials engineer Recovery Services  [address] Nuala Alpha  [place] [address] ([phone]) [phone]    [facility] (Restart)  [address] [place] [phone]  *Walk in hours Monday, Wednesday, Friday 1pm-2:30pm at [address] ([place]iforniap) [phone]   [facility] Chemical Dependency  [address] [place] [address] Dorothy Spark(Woodbury Building) (p) [phone]     [facility]  [address] [place] [address]  www.coniferpark.com (p) [phone]   [facility] (Monday- Thursday)  [address] [place] [address]  www.delphirise.org  *Walk in hours Monday - Thursday 8:00am- 4:00pm (p) [phone]   [facility]   [address] [place] [address] ([phone]) [phone]   [facility] Council on Alcohol & Substance Abuse (GCASA)  [address], [place], [place] [address]  [address] [place], [place] [address]  [address], [place], [place] [address] ([phone]) [phone] ([address])  (p) [phone] Jerolyn Shin([place])  (p) [phone] Veva Holes([place])   Huther Doyle  [address] [place] [address]  StubAgent.[email or web address]  *Walk in Hours Monday - Friday 8:30am, 10:00am, 12:30pm & 2:00pm (p) [phone]   Melissa NoonLa Lucha (Hispanic Outreach) - [facility] Restart  [address] [place] [address] [phone](p) [phone]   [facility]([place])  [address] [place] [address] [phone](p) [phone]   [facility]   [address] CarySouth Bldg[address] 2 [place] [address] (direct phone [phone])  [address] [place] [address]  (direct phone [phone])  [address] [place] [address] Jenelle Mages(Evelyn Brandon) (direct phone [phone])  [address] [place] [address] [facility]HC) (direct phone [phone])  BuyingShow.eswww.rochesterregional.org  *Walk in Hours Monday - Friday  8:00am - 3:00pm at all sites (p) [phone]  (indicate location when calling main intake line)   [facility]  [address] [place] [address]  www.chsbuffalo.org (p) [phone]   [facility]   [address] [place] [address]  www.Kingsley.[place] on the Severn.edu  *Walk in Hours Monday - Wednesday 8:00am  - 9:30am   *Phone Intake Monday - Friday 8:00am - 4:00pm (p) [phone]   (f) [phone]   [facility]  [address] Bld[address] B([address])  [place] [address]  www.westfallassociates.com (p) [phone]     RESIDENTIAL/SUPPORTIVE LIVING FACILITES:  [facility] (Restart)  [address] [place] [address] [phone](p) [phone] Ext [phone]     [facility] (CARS)   [address] [place] [address] ([phone]) [phone]   [facility]   [address] [place]. [place] [address]  www.easthouse.org (p) [phone]     [facility]FLACRA - Residental (2 Locations)           [facility]          [address] [place] [address]            [facility]          [address] [place] [address]  *Medicaid Managed Care and Private Ins. Only  (p) [phone] Liberty [employer]([place])    (p) [phone] [facility]([place])    CenterPoint EnergyFLACRA - Supportive Living   Locations in [place], [place], [place], [place] and [place]   *Apply through email address: [email or web address] No Number Listed  Use email address to apply    Insight Tribune CompanyHouse Chemical Dependency Services, Inc.   [address] [place] [phone](p) [phone]   [facility]   [address]  [place] [address]  www.helio.health (p) [place]  [address] [place] [address]  https://baker.biz/ (p) [phone]       [facility]   [address] [place] [address] [phone]   [facility] Supportive Living   [place] [address]  www.ywca.org (p) [phone]       [facility] ADMINISTRATION:  [place]  [address] [place] [address] [phone]     [place]  [address] [place] [address] [phone] [place] [place] [address] [phone]     [place]  [address] [place] [address] [phone]   [facility]  [place] [address]  *only  offers intake appointment if veteran is unable to be seen at another facility.  (p) [phone]       Resources for Drug and/or Alcohol Treatment:  LearningDermatology.com.au  https://ncadd-ra.org/    ADDITIONAL RESOURCES:  Al-Anon/Al-Teen (http://www.al-anon.alateen.org/)  7852729984   Alcohol Anonymous (AA)  (PhotoSolver.pl)   [place]. [phone]  [place]: [phone]  Phone Meetings: [phone]   Cocaine Anonymous (CA) (ForwardDrop.tn)  801-434-7784   Heroin Anonymous (HA) (http://www.heroinanonymous.org/)     Marijuana Anonymous (MA) (http://www.marijuana-anonymous,org/)  347-095-5683   Drug and Alcohol Council [phone]   Life Line 7014431330 or [address] on Alcohol & Substance Abuse Lutricia Feil) 878[address]   Narcotics Anonymous [place] 24-hour Hotline  (http://rochesterny-na.org/)   (http://flana.net)    [place]: 765-053-7502  [place]: 772-666-2334   Nicotine Anonymous (NicA) (http://www.nicotine-anonymous.org/)  [place] of Alcoholism & Substance Abuse Services (https://www.oasas.http://hubbard-west.org/)  802-547-1165   Pathways Methadone Maintenance Program  [address], [place], [place] [address] [phone]  M-F 6am-3pm   Refuge Recovery (http://www.refugerecovery.org/)  [email or web address]   ROCovery Fitness (http://www.rocoveryfitness.org/)   [address] [place], [place] [address] [phone]  mail@rocoveryfitness .Cloyd Stagers Recovery Methadone Maintenance Program  [address] [place] [address] [phone]         Level of outreach indicated if patient fails new intake or COPS (Comprehensive Outpatient Psychiatric Service) appointment: Routine Program Follow-up    When to call for help:    Call your psychiatric outpatient provider if experiencing any of these symptoms: increased irritability, sleep changes, appetite changes, energy changes,  thoughts to harm yourself or others, anxiety, fear, auditory or visual hallucinations.  Lifeline Helpline (24 hours/7 days) 762-715-0599 Product/process development scientist)  Mobile Crisis team: [phone]    General Instructions:  Other written information given to the patient: Yes safety plan  Return to Work/School on: as tolerated    Safety Plan      Step 1: Warning signs (thoughts, images, mood, situation, behaviors) that a crisis may be developing:   1.Music bothers me in the car when driving  2.Isolating self  3. Crying all the time      Step 2: Internal Coping Strategies - Things I can do to take my mind off my problems without contacting another person   1.Coloring  2.Reading  3.Music      Step 3: People and Social Settings that provide distraction   1. Name:Gina  2. Name:Harley  3. Place:  4. Place:       Step 4: People whom I can ask for help  1. Name:Mom  2. Name:Mobile Crisis  3. Name:      Step 5: Professional or agencies I can contact during a crisis   1. Clinician Name:[facility]  Phone #:Ph: [phone]  2. Local Urgent Care/Emergency Department: [facility]   3. Suicide Prevention Hotline Phone: 1-800-273-TALK (8255)  4. Mobile Crisis Team: [phone], 6463373460      Step 6: Making the environment safe   1.use coping skills  2.attend all mental health appointments          The above information has been discussed with me and I have received a copy.  I understand that I am advised to follow the instructions given to me to appropriately care for my condition.

## 2019-01-06 NOTE — ED Triage Notes (Addendum)
Suicidal.  Superficial cuts to L wrist.  Bleeding controlled, covered with gauze and tape.  Voluntary.  Denies alcohol/drugs.         Triage Note   Rosana Berger, RN

## 2019-01-06 NOTE — CPEP Notes (Signed)
CPEP Charge Nurse Note    Report received from [facility], ambulatory, voluntary  accompanied by patient alone    Chief Complaint:  Pt voluntary with superficial cuts to L wrist, endorsing SI.    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

## 2019-01-06 NOTE — CPEP Notes (Signed)
Pt observed in the milieu to be resting comfortably throughout majority of the shift, no acute distress noted. Food and fluid intake appropriate. Pt denied pain. VSS. Pt engaged appropriately with both writer and staff. Q15 safety checks and comfort measures maintained. Will continue to monitor.    Kristjan Derner, RN
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
## 2019-01-06 NOTE — CPEP Notes (Addendum)
CPEP Triage Note    Arrival    Patient is oriented to unit and CPEP evaluation process: Yes  Reviewed cell phone and visitor policies: yes   Reviewed contraband items/milieu safety concerns and confirmed no safety concerns present: yes    Patient is accompanied by: patient alone  Patient under MHA: No    History and Chief Complaint    Reason for current presentation: patient with PPHx of anxiety, depression, borderline personality d/o, and self reported "mixed bipolar," chronic PTSD and chronic SI reports voluntary presentation because " I just can't handle anything anymore.  I have a lot going on.  I think it would be better if I wasn't here anymore."  SIB (cutting to left wrist - no stitches) within the past hour.    Patient reports she lives with S.O and "we are kind of homeless."  She also reports she hasn't eaten since she was here in CPEP two days ago.    Denies HI/VI/AVH/access to guns.    Current Mental Health Provider(s): [facility]; dismissed from [facility] about 1 month ago.    Any recent exposure to infestations(lice, scabies, bedbugs, fleas) or other communicable diseases: No    Substance use: none     Ingestion: Denies    Self-harm: yes: cutting to left wrist (no stitches required)    Medication Data Collection    Medication data collection: Yes: Other: no meds in two weeks patient reports meds include Cymbalta, Abilify, trazodone, prazosin, Adderal and clonazepam.  Medications need verification.    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    Psychiatric/Behavioral: Positive for dysphoric mood, self-injury, sleep disturbance and suicidal ideas. Negative for hallucinations. The patient is nervous/anxious.        Any additional concerns: no    Anticipated track: Callahan, RN, 1:50 AM

## 2019-01-14 ENCOUNTER — Telehealth: Payer: Self-pay

## 2019-01-14 ENCOUNTER — Ambulatory Visit: Payer: Medicaid Other | Admitting: Clinical
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
## 2019-01-14 NOTE — Telephone Encounter (Signed)
[facility] Telephone Note     Duration of Call: 10 minutes.   After Hours? No    Contact Type:  Location: Off Site   MCT Referral     Visit Diagnosis:   Major depressive disorder, PtSD      Presenting Crisis:   PT was seen in CPEP for SI on 01/06/2019, scheduled for intake today 01/14/2019 but "no showed" requesting [facility] complete safety check and referral to services. Pt's [facility] is [facility] to [facility], so pt. should be close. COVID Screen - [place]      Plan:   Patient was referred to Mobile Crisis for further assessment.  Received electronic notification that referral was received via MCT Outlook e-mail address.

## 2019-01-14 NOTE — Progress Notes (Signed)
Mobile Crisis Referral  The following was submitted.  Submitted At 01/14/2019 9:54:16 AM   Patient Information   First Name Casey   Last Name Ravonda Hughes MRN [id number]    Date of Birth 30-Oct-1990   Gender Female   Parent Name (required if the patient is under the age of 29)    Address [place]   [address] [address]   Primary Phone [phone]   Secondary Phone    Type of Referral? None of the above   Reason for Mobile Crisis Referral PT was seen in CPEP for SI on 01/06/2019, scheduled for intake today 01/14/2019 but "no showed" requesting M[facility] complete safety check and referral to services. Pt's [facility] is currently admitted to [facility], so pt. should be close. COVID Screen - [place]   Requester Information   What is your full name? Ree Kida    Requester Phone [phone]   Ext. Number    What is your relationship to the patient? [facility] Intake Therapist    Requester Email (needed to receive a copy of this referral)

## 2019-01-16 ENCOUNTER — Emergency Department
Admission: EM | Admit: 2019-01-16 | Discharge: 2019-01-16 | Disposition: A | Discharge status: Home / Self Care | Source: Ambulatory Visit | Attending: Physician Assistant | Admitting: Physician Assistant

## 2019-01-16 ENCOUNTER — Encounter: Payer: Self-pay | Admitting: Gastroenterology

## 2019-01-16 DIAGNOSIS — Y998 Other external cause status: Secondary | ICD-10-CM | POA: Insufficient documentation

## 2019-01-16 DIAGNOSIS — S81011A Laceration without foreign body, right knee, initial encounter: Secondary | ICD-10-CM | POA: Insufficient documentation

## 2019-01-16 DIAGNOSIS — F17210 Nicotine dependence, cigarettes, uncomplicated: Secondary | ICD-10-CM | POA: Insufficient documentation

## 2019-01-16 DIAGNOSIS — R1084 Generalized abdominal pain: Secondary | ICD-10-CM

## 2019-01-16 DIAGNOSIS — Y9389 Activity, other specified: Secondary | ICD-10-CM | POA: Insufficient documentation

## 2019-01-16 DIAGNOSIS — Y92092 Bedroom in other non-institutional residence as the place of occurrence of the external cause: Secondary | ICD-10-CM | POA: Insufficient documentation

## 2019-01-16 DIAGNOSIS — T7421XA Adult sexual abuse, confirmed, initial encounter: Secondary | ICD-10-CM | POA: Insufficient documentation

## 2019-01-16 LAB — CBC AND DIFFERENTIAL
Baso # K/uL: 0 10*3/uL (ref 0.0–0.1)
Basophil %: 0.5 %
Eos # K/uL: 0 10*3/uL (ref 0.0–0.4)
Eosinophil %: 0.5 %
Hematocrit: 39 % (ref 34–45)
Hemoglobin: 13.2 g/dL (ref 11.2–15.7)
IMM Granulocytes #: 0 10*3/uL (ref 0.0–0.0)
IMM Granulocytes: 0.5 %
Lymph # K/uL: 0.8 10*3/uL — ABNORMAL LOW (ref 1.2–3.7)
Lymphocyte %: 12.8 %
MCH: 31 pg/cell (ref 26–32)
MCHC: 34 g/dL (ref 32–36)
MCV: 92 fL (ref 79–95)
Mono # K/uL: 0.6 10*3/uL (ref 0.2–0.9)
Monocyte %: 8.7 %
Neut # K/uL: 4.9 10*3/uL (ref 1.6–6.1)
Nucl RBC # K/uL: 0 10*3/uL (ref 0.0–0.0)
Nucl RBC %: 0 /100 WBC (ref 0.0–0.2)
Platelets: 218 10*3/uL (ref 160–370)
RBC: 4.3 MIL/uL (ref 3.9–5.2)
RDW: 13.1 % (ref 11.7–14.4)
Seg Neut %: 77 %
WBC: 6.3 10*3/uL (ref 4.0–10.0)

## 2019-01-16 LAB — URINALYSIS REFLEX TO CULTURE
Glucose,UA: NEGATIVE mg/dL
Ketones, UA: 15
Leuk Esterase,UA: NEGATIVE
Nitrite,UA: NEGATIVE
Protein,UA: NEGATIVE mg/dL
Specific Gravity,UA: 1.01 (ref 1.002–1.030)
pH,UA: 6.5 (ref 5.0–8.0)

## 2019-01-16 LAB — COMPREHENSIVE METABOLIC PANEL
ALT: 11 U/L (ref 0–35)
AST: 20 U/L (ref 0–35)
Albumin: 3.7 g/dL (ref 3.5–5.2)
Alk Phos: 87 U/L (ref 35–105)
Anion Gap: 12 (ref 7–16)
Bilirubin,Total: 0.4 mg/dL (ref 0.0–1.2)
CO2: 23 mmol/L (ref 20–28)
Calcium: 8.7 mg/dL — ABNORMAL LOW (ref 8.8–10.2)
Chloride: 102 mmol/L (ref 96–108)
Creatinine: 0.59 mg/dL (ref 0.51–0.95)
GFR,Black: 145 *
GFR,Caucasian: 126 *
Glucose: 89 mg/dL (ref 60–99)
Lab: 5 mg/dL — ABNORMAL LOW (ref 6–20)
Potassium: 3.8 mmol/L (ref 3.4–4.7)
Sodium: 137 mmol/L (ref 133–145)
Total Protein: 7.5 g/dL (ref 6.3–7.7)

## 2019-01-16 LAB — REGIONAL CHEMICAL DEPEN SCRN, UR
Amphetamine,UR: NEGATIVE
Barbiturate,UR: NEGATIVE
Benzodiazepinen,UR: NEGATIVE
Buprenorphine,Ur: NEGATIVE
Cocaine/Metab,UR: POSITIVE — AB
Methadone Metab,UR: NEGATIVE
Methamphetamine,Ur: NEGATIVE
Opiates,UR: NEGATIVE
Oxycodone,UR: NEGATIVE
PCP,UR: NEGATIVE
THC Metabolite,UR: POSITIVE — AB
Tricyclics,UR: NEGATIVE

## 2019-01-16 LAB — URINE MICROSCOPIC (IQ200): WBC,UA: NONE SEEN /hpf (ref 0–5)

## 2019-01-16 LAB — ETHANOL: Ethanol: <10 mg/dL (ref 0–9)

## 2019-01-16 MED ORDER — ONDANSETRON HCL 2 MG/ML IV SOLN *I*
4.0000 mg | Freq: Once | INTRAMUSCULAR | Status: DC
Start: 2019-01-16 — End: 2019-01-16

## 2019-01-16 MED ORDER — METRONIDAZOLE 500 MG PO TABS *I*
2000.0000 mg | ORAL_TABLET | Freq: Once | ORAL | Status: AC
Start: 2019-01-16 — End: 2019-01-16
  Administered 2019-01-16: 2000 mg via ORAL
  Filled 2019-01-16: qty 4

## 2019-01-16 MED ORDER — TETANUS-DIPHTH-ACELL PERT 5-2.5-18.5 LF-MCG/0.5 IM SUSP *WRAPPED*
0.5000 mL | Freq: Once | INTRAMUSCULAR | Status: DC
Start: 2019-01-16 — End: 2019-01-17

## 2019-01-16 MED ORDER — SODIUM CHLORIDE 0.9 % IV BOLUS *I*
1000.0000 mL | Freq: Once | Status: AC
Start: 2019-01-16 — End: 2019-01-16
  Administered 2019-01-16: 1000 mL via INTRAVENOUS

## 2019-01-16 MED ORDER — METRONIDAZOLE 500 MG PO TABS *I*
500.0000 mg | ORAL_TABLET | Freq: Once | ORAL | Status: DC
Start: 2019-01-16 — End: 2019-01-17
  Filled 2019-01-16: qty 1

## 2019-01-16 MED ORDER — KETOROLAC TROMETHAMINE 15 MG/ML IJ SOLN *I*
15.0000 mg | Freq: Once | INTRAMUSCULAR | Status: DC
Start: 2019-01-16 — End: 2019-01-16

## 2019-01-16 MED ORDER — AZITHROMYCIN 500 MG PO TABS *I*
1000.0000 mg | ORAL_TABLET | Freq: Once | ORAL | Status: AC
Start: 2019-01-16 — End: 2019-01-16
  Administered 2019-01-16: 1000 mg via ORAL
  Filled 2019-01-16: qty 2
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
## 2019-01-16 NOTE — ED Notes (Signed)
Pt is still waiting on provider discharge instructions
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
## 2019-02-01 ENCOUNTER — Ambulatory Visit
Admission: RE | Admit: 2019-02-01 | Discharge: 2019-02-01 | Disposition: A | Discharge status: Home / Self Care | Payer: Medicaid Other | Source: Ambulatory Visit | Attending: Pediatrics | Admitting: Pediatrics

## 2019-02-01 ENCOUNTER — Other Ambulatory Visit: Payer: Self-pay | Admitting: Pediatrics

## 2019-02-01 DIAGNOSIS — M79672 Pain in left foot: Secondary | ICD-10-CM

## 2019-02-01 DIAGNOSIS — M7989 Other specified soft tissue disorders: Secondary | ICD-10-CM

## 2019-02-08 ENCOUNTER — Other Ambulatory Visit
Admission: RE | Admit: 2019-02-08 | Discharge: 2019-02-08 | Disposition: A | Discharge status: Home / Self Care | Payer: Medicaid Other | Source: Ambulatory Visit | Attending: Obstetrics and Gynecology | Admitting: Obstetrics and Gynecology

## 2019-02-08 DIAGNOSIS — Z01419 Encounter for gynecological examination (general) (routine) without abnormal findings: Secondary | ICD-10-CM | POA: Insufficient documentation

## 2019-02-19 LAB — GYN CYTOLOGY

## 2019-03-20 ENCOUNTER — Emergency Department
Admission: EM | Admit: 2019-03-20 | Discharge: 2019-03-20 | Disposition: A | Discharge status: Home / Self Care | Payer: Medicaid Other | Source: Ambulatory Visit | Attending: Emergency Medicine | Admitting: Emergency Medicine

## 2019-03-20 ENCOUNTER — Emergency Department: Payer: Medicaid Other

## 2019-03-20 DIAGNOSIS — K5900 Constipation, unspecified: Secondary | ICD-10-CM

## 2019-03-20 DIAGNOSIS — Z975 Presence of (intrauterine) contraceptive device: Secondary | ICD-10-CM

## 2019-03-20 DIAGNOSIS — R112 Nausea with vomiting, unspecified: Secondary | ICD-10-CM | POA: Insufficient documentation

## 2019-03-20 DIAGNOSIS — R1030 Lower abdominal pain, unspecified: Secondary | ICD-10-CM | POA: Insufficient documentation

## 2019-03-20 DIAGNOSIS — R102 Pelvic and perineal pain: Secondary | ICD-10-CM | POA: Insufficient documentation

## 2019-03-20 DIAGNOSIS — R0981 Nasal congestion: Secondary | ICD-10-CM

## 2019-03-20 LAB — COMPREHENSIVE METABOLIC PANEL
ALT: 23 U/L (ref 0–35)
AST: 25 U/L (ref 0–35)
Albumin: 4.4 g/dL (ref 3.5–5.2)
Alk Phos: 101 U/L (ref 35–105)
Anion Gap: 13 (ref 7–16)
Bilirubin,Total: 0.3 mg/dL (ref 0.0–1.2)
CO2: 25 mmol/L (ref 20–28)
Calcium: 9.7 mg/dL (ref 8.8–10.2)
Chloride: 100 mmol/L (ref 96–108)
Creatinine: 1.01 mg/dL — ABNORMAL HIGH (ref 0.51–0.95)
GFR,Black: 88 *
GFR,Caucasian: 76 *
Glucose: 71 mg/dL (ref 60–99)
Lab: 10 mg/dL (ref 6–20)
Potassium: 4 mmol/L (ref 3.4–4.7)
Sodium: 138 mmol/L (ref 133–145)
Total Protein: 8 g/dL — ABNORMAL HIGH (ref 6.3–7.7)

## 2019-03-20 LAB — URINALYSIS REFLEX TO CULTURE
Glucose,UA: NEGATIVE mg/dL
Ketones, UA: NEGATIVE mg/dL
Nitrite,UA: NEGATIVE
Protein,UA: NEGATIVE mg/dL
Specific Gravity,UA: 1.013 (ref 1.002–1.030)
pH,UA: 7 (ref 5.0–8.0)

## 2019-03-20 LAB — CBC AND DIFFERENTIAL
Baso # K/uL: 0 10*3/uL (ref 0.0–0.1)
Basophil %: 0.4 %
Eos # K/uL: 0.2 10*3/uL (ref 0.0–0.4)
Eosinophil %: 2.1 %
Hematocrit: 43 % (ref 34–45)
Hemoglobin: 14.3 g/dL (ref 11.2–15.7)
IMM Granulocytes #: 0 10*3/uL (ref 0.0–0.0)
IMM Granulocytes: 0.3 %
Lymph # K/uL: 2.7 10*3/uL (ref 1.2–3.7)
Lymphocyte %: 26 %
MCH: 31 pg/cell (ref 26–32)
MCHC: 33 g/dL (ref 32–36)
MCV: 93 fL (ref 79–95)
Mono # K/uL: 0.6 10*3/uL (ref 0.2–0.9)
Monocyte %: 5.3 %
Neut # K/uL: 6.8 10*3/uL — ABNORMAL HIGH (ref 1.6–6.1)
Nucl RBC # K/uL: 0 10*3/uL (ref 0.0–0.0)
Nucl RBC %: 0 /100 WBC (ref 0.0–0.2)
Platelets: 281 10*3/uL (ref 160–370)
RBC: 4.6 MIL/uL (ref 3.9–5.2)
RDW: 14 % (ref 11.7–14.4)
Seg Neut %: 65.9 %
WBC: 10.4 10*3/uL — ABNORMAL HIGH (ref 4.0–10.0)

## 2019-03-20 LAB — PREGNANCY TEST, SERUM: Preg,Serum: NEGATIVE

## 2019-03-20 LAB — URINE MICROSCOPIC (IQ200): WBC,UA: NONE SEEN /hpf (ref 0–5)

## 2019-03-20 MED ORDER — KETOROLAC TROMETHAMINE 15 MG/ML IJ SOLN *I*
15.0000 mg | Freq: Once | INTRAMUSCULAR | Status: AC
Start: 2019-03-20 — End: 2019-03-20
  Administered 2019-03-20: 15 mg via INTRAVENOUS
  Filled 2019-03-20: qty 1

## 2019-03-20 MED ORDER — IBUPROFEN 600 MG PO TABS *I*
600.0000 mg | ORAL_TABLET | Freq: Three times a day (TID) | ORAL | 0 refills | Status: AC | PRN
Start: 2019-03-20 — End: 2019-04-03

## 2019-03-20 MED ORDER — ONDANSETRON HCL 2 MG/ML IV SOLN *I*
4.0000 mg | Freq: Once | INTRAMUSCULAR | Status: AC
Start: 2019-03-20 — End: 2019-03-20
  Administered 2019-03-20: 4 mg via INTRAVENOUS
  Filled 2019-03-20: qty 2

## 2019-03-20 MED ORDER — HYDROMORPHONE HCL PF 0.5 MG/0.5 ML IJ SOLN *I*
0.5000 mg | Freq: Once | INTRAMUSCULAR | Status: AC
Start: 2019-03-20 — End: 2019-03-20
  Administered 2019-03-20: 0.5 mg via INTRAVENOUS
  Filled 2019-03-20: qty 0.5

## 2019-03-20 MED ORDER — SODIUM CHLORIDE 0.9 % IV BOLUS *I*
1000.0000 mL | Freq: Once | Status: AC
Start: 2019-03-20 — End: 2019-03-20
  Administered 2019-03-20: 1000 mL via INTRAVENOUS

## 2019-03-20 MED ORDER — MORPHINE SULFATE 2 MG/ML IV SOLN *WRAPPED*
4.0000 mg | Freq: Once | Status: AC
Start: 2019-03-20 — End: 2019-03-20
  Administered 2019-03-20: 4 mg via INTRAVENOUS
  Filled 2019-03-20: qty 2

## 2019-03-20 NOTE — Discharge Instructions (Signed)
[place] of your abdomen and pelvis was negative for any acute abnormalities.  Use the ibuprofen 600 mg tablets up to 3 times a day as needed for pelvic pain.  Drink plenty of fluid.  Follow-up with your GYN appointment as previously scheduled on Monday.  Return for shortness of breath, chest pain fever chills or worsening abdominal pain.

## 2019-03-20 NOTE — ED Provider Notes (Addendum)
History     Chief Complaint   Patient presents with    Pelvic Pain    Vaginal Pain     HPI   28 year old female patient with history of anxiety, asthma, borderline personality disorder, chronic kidney disease, depression, fibromyalgia and endometriosis and morbid obesity presents to the emergency department with pelvic pain and vaginal pain which became much worse today.  Patient states she is having difficulty walking and has had nausea with several episodes of vomiting with the most recent prior to arrival.  Patient states that she has having internal and external vaginal pain.  Patient's denies vaginal discharge or chance of pregnancy.  She states that her last sexual encounter was about 3 months ago.  First day of her last period was over 1 month ago.      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    Depression     Endometriosis 2011    Fibromyalgia     Hypothyroid     12/14 denies     Migraines     Seizures     last 03/2017 in context abrupt withdrawl from medication    SVT (supraventricular tachycardia) 2013        Patient Active Problem List   Diagnosis Code    Calculi, ureter N20.1    GERD (gastroesophageal reflux disease) K21.9    Hypothyroid E03.9    Bronchitis J40    Pneumonia J18.9    Anxiety F41.9    Depression F32.9    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    Renal calculi N20.0    SVT (supraventricular tachycardia) I47.1    Fibromyalgia M79.7    Positive ANA (antinuclear antibody) R76.8    Major depressive disorder, recurrent, moderate F33.1    PTSD (post-traumatic stress disorder) F43.10            Past Surgical History:   Procedure Laterality Date    APPENDECTOMY      BUNIONECTOMY      COLONOSCOPY      lithotripsy       Family History   Problem Relation Age of Onset    Anxiety disorder Mother     Arthritis Mother     Cancer Mother      Depression Mother     Emphysema Mother     Arthritis Father           Social History     Tobacco Use    Smoking status: Current Every Day Smoker     Packs/day: 0.50     Years: 5.00     Pack years: 2.50     Types: Cigarettes    Smokeless tobacco: Never Used   Substance Use Topics    Alcohol use: Not Currently     Alcohol/week: 2.0 standard drinks     Types: 2 Glasses of wine per week     Comment: denies recent use    Drug use: Yes     Types: Marijuana     Comment: "not by choice"     Living Situation     Questions Responses    Patient lives with Significant Other    Comment: boyfriend     Homeless Yes    Comment: "we're kinda homeless" (moving around from place to place.  Caregiver for other family member No    External Services Mental Health Services    Comment: [facility] ("dismissed" from [place] last month)     Employment Employed    Comment: [employer]      Domestic Violence Risk No                Review of Systems   Review of Systems   Constitutional: Negative for chills, diaphoresis, fatigue and fever.   HENT: Negative for congestion and rhinorrhea.    Eyes: Positive for redness. Negative for photophobia.   Respiratory: Negative for cough, chest tightness and shortness of breath.    Cardiovascular: Negative for chest pain, palpitations and leg swelling.   Gastrointestinal: Positive for abdominal pain, constipation, nausea and vomiting.   Genitourinary: Positive for pelvic pain and vaginal pain. Negative for flank pain, hematuria and vaginal discharge.   Musculoskeletal: Negative for arthralgias, neck pain and neck stiffness.   Skin: Negative for color change.   Neurological: Negative for dizziness, weakness and headaches.   Psychiatric/Behavioral: Positive for behavioral problems. Negative for decreased concentration and self-injury. The patient is nervous/anxious.    All other systems reviewed and are negative.      Physical Exam     Triage Vitals      First  Recorded BP: 140/82, Resp: 18, Temp: 36.1 C (97 F), Temp src: TEMPORAL Oxygen Therapy SpO2: 95 %, Oximetry Source: Lt Hand, O2 Device: None (Room air), Heart Rate: 64, (03/20/19 1740)  .  First Pain Reported  0-10 Scale: 10, Pain Location/Orientation: (pelvic and vaginal pain), (03/20/19 1740)       Physical Exam  Vitals signs and nursing note reviewed. Exam conducted with a chaperone present.   Constitutional:       General: She is in acute distress.      Appearance: She is obese. She is not ill-appearing.   HENT:      Head: Normocephalic and atraumatic.      Nose: Congestion and rhinorrhea present.      Comments: Patient is tearful and crying     Mouth/Throat:      Mouth: Mucous membranes are moist.   Eyes:      Extraocular Movements: Extraocular movements intact.      Comments: Sclera injected from crying   Neck:      Musculoskeletal: Normal range of motion.   Cardiovascular:      Rate and Rhythm: Normal rate and regular rhythm.      Pulses: Normal pulses.   Pulmonary:      Effort: Pulmonary effort is normal. No respiratory distress.   Abdominal:      General: Abdomen is protuberant. Bowel sounds are normal.      Palpations: Abdomen is soft.      Tenderness: There is generalized abdominal tenderness. There is guarding.   Genitourinary:     General: Normal vulva.      Exam position: Lithotomy position.      Labia:         Right: Tenderness present. No rash or lesion.         Left: Tenderness present. No rash or lesion.       Vagina: No signs of injury. Vaginal discharge present. No bleeding.      Cervix: Discharge present. No cervical motion tenderness, friability or lesion.      Adnexa:         Right: Tenderness present.         Left:  Tenderness present.       Comments: Dark brown and white colored discharge noted from cervix.  Patient very uncomfortable during exam tolerated poorly  Skin:     General: Skin is warm.      Capillary Refill: Capillary refill takes less than 2 seconds.   Neurological:      General:  No focal deficit present.      Mental Status: She is alert.      GCS: GCS eye subscore is 4. GCS verbal subscore is 5. GCS motor subscore is 6.      Cranial Nerves: Cranial nerves are intact.   Psychiatric:         Attention and Perception: Attention normal.         Mood and Affect: Mood is anxious. Affect is blunt and tearful.         Speech: Speech normal.         Behavior: Behavior is agitated.         Medical Decision Making   Patient seen by me on:  03/20/2019    Assessment:  28 year old female presents to the emergency department complaints of lower abdominal pain, vaginal pain, nausea and vomiting which became worse today. Patient states she has a biopsy scheduled for Monday after her second abnormal biopsy. She denies vaginal discharge. Patient is sobbing during exam with wailing cries.     Differential diagnosis:  Ovarian cyst, ovarian torsion, acute cystitis, endometriosis, adhesions, uterine fibroids, ectopic pregnancy, UTI, STI    Plan:  Orders Placed This Encounter      N. Gonorrhoeae DNA amplification      Chlamydia plasmid DNA amplification      Vaginitis screen: DNA probe      Urinalysis reflex to culture      Portable US pelvic and transvaginal complete      CBC and differential      Comprehensive metabolic panel      Urinalysis with reflex to Microscopic UA and reflex to Bacterial Culture      Pregnancy Test, Serum      Urine microscopic (iq200)      Insert peripheral IV      sodium chloride 0.9 % bolus 1,000 mL      ondansetron (ZOFRAN) injection 4 mg      morphine 2 mg/mL injection 4 mg      ketorolac (TORADOL) 15 mg/mL injection 15 mg      HYDROmorphone (DILAUDID) injection 0.5 mg      ibuprofen (ADVIL,MOTRIN) 600 MG tablet      Independent review of: Existing labs, chart/prior records    ED Course and Disposition:  Patient's lab work reviewed, no acute abnormalities. Patient's UA showed no signs of infection. Unremarkable pelvic ultrasound.   IUD appears in appropriate position. The LEFT adnexa  is not clearly visualized. Grossly unremarkable RIGHT adnexa. Pelvic exam showed dark brown and which discharge, no bleeding from cervix, OS closed. Patient has cervical motion tenderness, tolerated exam poorly. Pain poorly controlled initially, improved after dilaudid. Upon revaluation patient is no longer crying and asking to return home. Patient has scheduled OBGYN appointment Monday, will recommend she take Ibuprofen 600mg  regularly during the day. She is agreeable with this plan and is eager for discharge.     I had a detailed discussion with the patient regarding the historical points, exam findings, and any diagnostic results supporting the discharge diagnosis. Based on the patient's history, exam, and diagnositic evaluation, there is no indication for  further emergent intervention or inpatient treatment. Verbal and written discharge instructions were provided. Patient was encouraged to return for any worsening symptoms, persisting symptoms, or any other concerns. Patient was provided the opportunity to ask questions. Patient was advised to follow-up as directed in discharge instructions.    Discussed case & plan with Dr. Noah Charon who also independently evaluated the patient and agrees with the course of care and plan.               Glynn Octave, NP      APP Review:    I had face-to-face interaction with the patient on 03/20/2019.    I was asked by APP to see this patient due to diagnostic uncertainty.      I have reviewed and agree with the above documentation and, in addition:    The history is notable for Pt has biopsy scheduled for upcoming Monday for GYN lesions.  Has had pelvic pain for 1 month, daily, constant today.  No fever or CP or SOB or cough.  No dysuria or D/C.  Marland Kitchen    Exam is notable for PERRL.  Sat up in bed with pain.  Low back TTP.  Lower abdomen tender to palpation out of proportion to exam.  Severe obesity.  Soft.  No upper abd tenderness.  Normal BS.  No distention.  No LE edema or calf  tenderness.  .    Patient at risk for Endometriosis/psychiatric illness/ovarian cyst/chronic pain.  .    Our plan is Labs, UA, pelvic US and pelvic exam.  .        Author:  Kinnie Feil, MD       Glynn Octave, NP  03/26/19 0100       Kinnie Feil, MD  03/26/19 [phone]

## 2019-03-20 NOTE — ED Triage Notes (Signed)
Pt comes into the ED today by car from home for pelvic and vaginal pain 10/10.  Pt reports that the pain started about a month ago.  Pt is scheduled for bx this Monday for a growth inside her vaginal area       Triage Note   Gertie Baron, RN

## 2019-03-21 DIAGNOSIS — R5383 Other fatigue: Secondary | ICD-10-CM

## 2019-03-21 DIAGNOSIS — R300 Dysuria: Secondary | ICD-10-CM

## 2019-03-21 DIAGNOSIS — R102 Pelvic and perineal pain: Secondary | ICD-10-CM

## 2019-03-21 DIAGNOSIS — F17210 Nicotine dependence, cigarettes, uncomplicated: Secondary | ICD-10-CM | POA: Insufficient documentation

## 2019-03-21 DIAGNOSIS — R112 Nausea with vomiting, unspecified: Secondary | ICD-10-CM

## 2019-03-21 LAB — CHLAMYDIA PLASMID DNA AMPLIFICATION: Chlamydia Plasmid DNA Amplification: 0

## 2019-03-21 LAB — VAGINITIS SCREEN: DNA PROBE: Vaginitis Screen:DNA Probe: 0

## 2019-03-21 LAB — N. GONORRHOEAE DNA AMPLIFICATION: N. gonorrhoeae DNA Amplification: 0

## 2019-03-21 NOTE — ED Triage Notes (Addendum)
Pt has returned to ED with complaint of pelvic and vaginal pain after being seen yesterday. Known abnormal PAP smear. Pt states pain is worse than last night. Pt followed instruction to administer 600 mg of ibuprofen, lat dose at 2000 with no relief. Has PCP appt Friday morning and OB/GYN on Monday.     Triage Note   Lavone Orn, RN

## 2019-03-22 ENCOUNTER — Emergency Department: Payer: Medicaid Other

## 2019-03-22 ENCOUNTER — Emergency Department
Admission: EM | Admit: 2019-03-22 | Discharge: 2019-03-22 | Disposition: A | Discharge status: Home / Self Care | Payer: Medicaid Other | Source: Ambulatory Visit | Attending: Emergency Medicine | Admitting: Emergency Medicine

## 2019-03-22 DIAGNOSIS — R1030 Lower abdominal pain, unspecified: Secondary | ICD-10-CM

## 2019-03-22 DIAGNOSIS — R102 Pelvic and perineal pain: Secondary | ICD-10-CM

## 2019-03-22 LAB — CBC AND DIFFERENTIAL
Baso # K/uL: 0.1 10*3/uL (ref 0.0–0.1)
Basophil %: 1 %
Eos # K/uL: 0.2 10*3/uL (ref 0.0–0.4)
Eosinophil %: 2 %
Hematocrit: 43 % (ref 34–45)
Hemoglobin: 14.6 g/dL (ref 11.2–15.7)
Lymph # K/uL: 2.8 10*3/uL (ref 1.2–3.7)
Lymphocyte %: 27 %
MCH: 31 pg/cell (ref 26–32)
MCHC: 34 g/dL (ref 32–36)
MCV: 92 fL (ref 79–95)
Mono # K/uL: 0.8 10*3/uL (ref 0.2–0.9)
Monocyte %: 8 %
Neut # K/uL: 6.2 10*3/uL — ABNORMAL HIGH (ref 1.6–6.1)
Nucl RBC # K/uL: 0 10*3/uL (ref 0.0–0.0)
Nucl RBC %: 0 /100 WBC (ref 0.0–0.2)
Platelets: 269 10*3/uL (ref 160–370)
RBC: 4.7 MIL/uL (ref 3.9–5.2)
RDW: 13.8 % (ref 11.7–14.4)
Seg Neut %: 61 %
WBC: 10.1 10*3/uL — ABNORMAL HIGH (ref 4.0–10.0)

## 2019-03-22 LAB — DIFF MANUAL
Diff Based On: 100 CELLS
React Lymph %: 1 % (ref 0–6)

## 2019-03-22 LAB — COMPREHENSIVE METABOLIC PANEL
ALT: 22 U/L (ref 0–35)
AST: 20 U/L (ref 0–35)
Albumin: 4.3 g/dL (ref 3.5–5.2)
Alk Phos: 101 U/L (ref 35–105)
Anion Gap: 11 (ref 7–16)
Bilirubin,Total: 0.2 mg/dL (ref 0.0–1.2)
CO2: 26 mmol/L (ref 20–28)
Calcium: 9.9 mg/dL (ref 8.8–10.2)
Chloride: 97 mmol/L (ref 96–108)
Creatinine: 0.9 mg/dL (ref 0.51–0.95)
GFR,Black: 101 *
GFR,Caucasian: 88 *
Glucose: 80 mg/dL (ref 60–99)
Lab: 11 mg/dL (ref 6–20)
Potassium: 4.2 mmol/L (ref 3.4–4.7)
Sodium: 134 mmol/L (ref 133–145)
Total Protein: 8.3 g/dL — ABNORMAL HIGH (ref 6.3–7.7)

## 2019-03-22 MED ORDER — ONDANSETRON HCL 2 MG/ML IV SOLN *I*
4.0000 mg | Freq: Once | INTRAMUSCULAR | Status: AC
Start: 2019-03-22 — End: 2019-03-22
  Administered 2019-03-22: 4 mg via INTRAVENOUS
  Filled 2019-03-22: qty 2

## 2019-03-22 MED ORDER — IOHEXOL 350 MG/ML (OMNIPAQUE) IV SOLN 500ML BOTTLE *I*
1.0000 mL | Freq: Once | INTRAVENOUS | Status: AC
Start: 2019-03-22 — End: 2019-03-22
  Administered 2019-03-22: 140 mL via INTRAVENOUS

## 2019-03-22 MED ORDER — MORPHINE SULFATE 2 MG/ML IV SOLN *WRAPPED*
4.0000 mg | Freq: Once | Status: AC
Start: 2019-03-22 — End: 2019-03-22
  Administered 2019-03-22: 4 mg via INTRAVENOUS
  Filled 2019-03-22: qty 2

## 2019-03-22 MED ORDER — SODIUM CHLORIDE 0.9 % IV BOLUS *I*
1000.0000 mL | Freq: Once | Status: AC
Start: 2019-03-22 — End: 2019-03-22
  Administered 2019-03-22: 1000 mL via INTRAVENOUS

## 2019-03-22 MED ORDER — OXYCODONE-ACETAMINOPHEN 5-325 MG PO TABS *I*
1.0000 | ORAL_TABLET | Freq: Once | ORAL | Status: AC
Start: 2019-03-22 — End: 2019-03-22
  Administered 2019-03-22: 1 via ORAL
  Filled 2019-03-22: qty 1

## 2019-03-22 MED ORDER — TAKE HOME MEDICATION PLACE HOLDER *I*
1.0000 | ORAL_TABLET | Freq: Once | ORAL | Status: AC
Start: 2019-03-22 — End: 2019-03-22
  Filled 2019-03-22: qty 3

## 2019-03-22 NOTE — ED Provider Notes (Signed)
History     Chief Complaint   Patient presents with    Pelvic Pain    Vaginal Pain     Patient is a 28 yo F with a PMH of anxiety, asthma, CKD, depression, fibromyalgia, endometriosis, borderline personality disorder, hypothyroidism, migraines, morbid obesity and GERD who presents to the Emergency Department (ED) with pelvic pain and vaginal pain. Patient states she has had this pelvic/vaginal pain over the past month which has gotten acutely worse over the past 2 days. Patient states [facility] for the same pain and had reassuring labs, UA, pelvic ultrasound and pelviv exam. Patient states she has been taking ibuprofen 600 mg every 6 hours for the pain with minimal relief. Patient states she has also had persistent nausea and vomiting and has been unable to tolerate PO. Patient reports she has follow up with her PCP tomorrow morning and and OBGYN appt on Monday for a biopsy. Patient  denies any vaginal discharge or bleeding. She states that her last sexual encounter was about 3 months ago.  First day of her last period was over 1 month ago.          History provided by:  Patient  Language interpreter used: No        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    Depression     Endometriosis 2011    Fibromyalgia     Hypothyroid     12/14 denies     Migraines     Seizures     last 03/2017 in context abrupt withdrawl from medication    SVT (supraventricular tachycardia) 2013        Patient Active Problem List   Diagnosis Code    Calculi, ureter N20.1    GERD (gastroesophageal reflux disease) K21.9    Hypothyroid E03.9    Bronchitis J40    Pneumonia J18.9    Anxiety F41.9    Depression F32.9    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    Renal calculi N20.0    SVT (supraventricular tachycardia) I47.1    Fibromyalgia M79.7     Positive ANA (antinuclear antibody) R76.8    Major depressive disorder, recurrent, moderate F33.1    PTSD (post-traumatic stress disorder) F43.10            Past Surgical History:   Procedure Laterality Date    APPENDECTOMY      BUNIONECTOMY      COLONOSCOPY      lithotripsy       Family History   Problem Relation Age of Onset    Anxiety disorder Mother     Arthritis Mother     Cancer Mother     Depression Mother     Emphysema Mother     Arthritis Father           Social History     Tobacco Use    Smoking status: Current Every Day Smoker     Packs/day: 0.50     Years: 5.00     Pack years: 2.50     Types: Cigarettes    Smokeless tobacco: Never Used   Substance Use Topics    Alcohol use: Not Currently     Alcohol/week: 2.0 standard drinks  Types: 2 Glasses of wine per week     Comment: denies recent use    Drug use: Yes     Types: Marijuana     Comment: medical marijuana pt     Living Situation     Questions Responses    Patient lives with Significant Other    Comment: boyfriend     Homeless Yes    Comment: "we're kinda homeless" (moving around from place to place.     Caregiver for other family member No    External Services Mental Health Services    Comment: [facility] mental [facility] ("dismissed" from [place] [facility] last month)     Employment Unemployed    Comment: [employer]      Domestic Violence Risk No                Review of Systems   Review of Systems   Constitutional: Positive for fatigue. Negative for chills and fever.   Eyes: Negative for photophobia and visual disturbance.   Respiratory: Negative for shortness of breath.    Cardiovascular: Negative for chest pain.   Gastrointestinal: Positive for abdominal pain, nausea and vomiting. Negative for constipation and diarrhea.   Genitourinary: Positive for dysuria, pelvic pain and vaginal pain. Negative for decreased urine volume, difficulty urinating, flank pain, genital sores and hematuria.   Musculoskeletal: Negative for  myalgias.   Skin: Negative for rash.   Neurological: Positive for light-headedness. Negative for dizziness, syncope, weakness, numbness and headaches.   Psychiatric/Behavioral: The patient is nervous/anxious.        Physical Exam     Triage Vitals  Triage Start: Start, (03/21/19 2357)   First Recorded BP: 133/86, Resp: 20, Temp: 36.2 C (97.2 F), Temp src: TEMPORAL Oxygen Therapy SpO2: 96 %, Oximetry Source: Rt Hand, O2 Device: None (Room air), Heart Rate: 72, (03/21/19 2359)  .  First Pain Reported  0-10 Scale: 10, Pain Location/Orientation: Vagina;Pelvis, (03/21/19 2359)       Physical Exam  Vitals signs and nursing note reviewed.   Constitutional:       General: She is not in acute distress.     Appearance: She is morbidly obese.   HENT:      Head: Normocephalic and atraumatic.      Mouth/Throat:      Mouth: Mucous membranes are moist.      Pharynx: Oropharynx is clear.   Eyes:      Extraocular Movements: Extraocular movements intact.      Conjunctiva/sclera: Conjunctivae normal.   Neck:      Musculoskeletal: Normal range of motion.   Cardiovascular:      Rate and Rhythm: Normal rate and regular rhythm.   Pulmonary:      Effort: Pulmonary effort is normal.      Breath sounds: Normal breath sounds.   Abdominal:      General: Abdomen is protuberant.      Palpations: Abdomen is soft.      Tenderness: There is abdominal tenderness in the right lower quadrant, suprapubic area and left lower quadrant. There is no right CVA tenderness, left CVA tenderness, guarding or rebound. Negative signs include Murphy's sign and McBurney's sign.   Musculoskeletal: Normal range of motion.   Skin:     General: Skin is warm and dry.   Neurological:      Mental Status: She is alert.      GCS: GCS eye subscore is 4. GCS verbal subscore is 5. GCS motor subscore is 6.  Psychiatric:         Mood and Affect: Mood is anxious. Affect is tearful.         Medical Decision Making   Patient seen by me on:  03/21/2019    Assessment:  Patient is a  28 yo F with a PMH of anxiety, asthma, CKD, depression, fibromyalgia, endometriosis, borderline personality disorder, hypothyroidism, migraines, morbid obesity and GERD who presents to the Emergency Department (ED) with pelvic pain and vaginal pain x 1 month that has gotten acutely worse oer the past 2 days and is accompanied by nausea/vomiting. VSS, afebrile. Reassuring work up yesterday including ultrasound, pelvic exam, UA, urine pregnancy, labs and STI testing.     Unclear etiology of patient's pelvic/vaginabl pain. Given pain in lower abd, will obtain CT imaging for further valuation.     Differential diagnosis:  Vulvodynia, endometriosis, ovarian cyst, interstitial cystitis, IBS, adhesion, fibromyalgia, anxiety, depression, appendicitis, diverticulitis, diverticulosis     Plan:  Orders Placed This Encounter      CT abdomen and pelvis with contrast      CBC and differential      Comprehensive metabolic panel      Insert peripheral IV    IV normal saline, IV morphine, IV zofran    Independent review of: Existing labs, CT scans, chart/prior records    ED Course and Disposition:  Re-evaluated patient, resting comfortably in bed. Tolerating PO. No recurrence of nausea or vomiting. Reports pain is now down to a 8/10 following IV morphine.    Labs within normal limits/consistent with prior.    CT abd/pelvis with contrast showed no acute findings to explain the patient's manifestation.    Unclear etiology of patient's pelvic pain, possibly 2/2 to vulvodynia vs known endometriosis. Okay for discharge at this time with follow up with PCP at already scheduled appt later today. Return precautions discussed.      Mariana Arn, MD, 03/22/2019, 1:57 AM     Mariana Arn, MD  03/22/19 0200    Patient requesting something for her severe pain. Has ride home with mother. Will give on Percocet prior to discharge and sent home with Zofran for nausea/vomiting PRN. Explained to patient she will have to talk to her PCP/OBGYN  regarding pain management if over-the-counter analgesia is not sufficient controlled her pain.     Mariana Arn, MD  03/22/19 0205       Mariana Arn, MD          Mariana Arn, MD  03/22/19 [phone]

## 2019-03-22 NOTE — ED Provider Progress Notes (Addendum)
ED Provider Progress Note    Re-evaluated patient, resting comfortably in bed. Tolerating PO. No recurrence of nausea or vomiting. Reports pain is now down to a 8/10 following IV morphine.    Labs within normal limits/consistent with prior.    CT abd/pelvis with contrast showed no acute findings to explain the patient's manifestation.    Unclear etiology of patient's pelvic pain, possibly 2/2 to vulvodynia vs known endometriosis. Okay for discharge at this time with follow up with PCP at already scheduled appt later today. Return precautions discussed.      Mariana Arn, MD, 03/22/2019, 1:57 AM     Mariana Arn, MD  03/22/19 0200    Patient requesting something for her severe pain. Has ride home with mother. Will give on Percocet prior to discharge and sent home with Zofran for nausea/vomiting PRN. Explained to patient she will have to talk to her PCP/OBGYN regarding pain management if over-the-counter analgesia is not sufficient controlled her pain.     Mariana Arn, MD  03/22/19 [phone]

## 2019-03-24 ENCOUNTER — Emergency Department
Admission: EM | Admit: 2019-03-24 | Discharge: 2019-03-24 | Disposition: A | Discharge status: Home / Self Care | Payer: Medicaid Other | Source: Ambulatory Visit | Attending: Palliative Care | Admitting: Palliative Care

## 2019-03-24 DIAGNOSIS — N393 Stress incontinence (female) (male): Secondary | ICD-10-CM | POA: Insufficient documentation

## 2019-03-24 DIAGNOSIS — R000 Tachycardia, unspecified: Secondary | ICD-10-CM

## 2019-03-24 DIAGNOSIS — R102 Pelvic and perineal pain: Secondary | ICD-10-CM

## 2019-03-24 DIAGNOSIS — R200 Anesthesia of skin: Secondary | ICD-10-CM

## 2019-03-24 DIAGNOSIS — N939 Abnormal uterine and vaginal bleeding, unspecified: Secondary | ICD-10-CM

## 2019-03-24 DIAGNOSIS — F17210 Nicotine dependence, cigarettes, uncomplicated: Secondary | ICD-10-CM | POA: Insufficient documentation

## 2019-03-24 DIAGNOSIS — R109 Unspecified abdominal pain: Secondary | ICD-10-CM

## 2019-03-24 LAB — CBC AND DIFFERENTIAL
Baso # K/uL: 0 10*3/uL (ref 0.0–0.1)
Basophil %: 0.4 %
Eos # K/uL: 0.1 10*3/uL (ref 0.0–0.4)
Eosinophil %: 0.8 %
Hematocrit: 38 % (ref 34–45)
Hemoglobin: 12.6 g/dL (ref 11.2–15.7)
IMM Granulocytes #: 0 10*3/uL (ref 0.0–0.0)
IMM Granulocytes: 0.5 %
Lymph # K/uL: 1.9 10*3/uL (ref 1.2–3.7)
Lymphocyte %: 25.9 %
MCH: 31 pg/cell (ref 26–32)
MCHC: 33 g/dL (ref 32–36)
MCV: 94 fL (ref 79–95)
Mono # K/uL: 0.4 10*3/uL (ref 0.2–0.9)
Monocyte %: 5.8 %
Neut # K/uL: 4.9 10*3/uL (ref 1.6–6.1)
Nucl RBC # K/uL: 0 10*3/uL (ref 0.0–0.0)
Nucl RBC %: 0 /100 WBC (ref 0.0–0.2)
Platelets: 245 10*3/uL (ref 160–370)
RBC: 4.1 MIL/uL (ref 3.9–5.2)
RDW: 13.5 % (ref 11.7–14.4)
Seg Neut %: 66.6 %
WBC: 7.4 10*3/uL (ref 4.0–10.0)

## 2019-03-24 LAB — COMPREHENSIVE METABOLIC PANEL
ALT: 23 U/L (ref 0–35)
Albumin: 3.7 g/dL (ref 3.5–5.2)
Alk Phos: 82 U/L (ref 35–105)
Anion Gap: 11 (ref 7–16)
Bilirubin,Total: 0.2 mg/dL (ref 0.0–1.2)
CO2: 23 mmol/L (ref 20–28)
Calcium: 8.4 mg/dL — ABNORMAL LOW (ref 8.8–10.2)
Chloride: 102 mmol/L (ref 96–108)
Creatinine: 0.56 mg/dL (ref 0.51–0.95)
GFR,Black: 147 *
GFR,Caucasian: 128 *
Glucose: 88 mg/dL (ref 60–99)
Lab: 15 mg/dL (ref 6–20)
Potassium: 4.6 mmol/L (ref 3.4–4.7)
Sodium: 136 mmol/L (ref 133–145)
Total Protein: 6.9 g/dL (ref 6.3–7.7)

## 2019-03-24 LAB — LACTATE, PLASMA: Lactate: 0.9 mmol/L (ref 0.5–2.2)

## 2019-03-24 MED ORDER — HYDROMORPHONE HCL 2 MG PO TABS *I*
2.0000 mg | ORAL_TABLET | ORAL | 0 refills | Status: DC | PRN
Start: 2019-03-24 — End: 2020-04-22

## 2019-03-24 MED ORDER — SODIUM CHLORIDE 0.9 % IV BOLUS *I*
1000.0000 mL | Freq: Once | Status: AC
Start: 2019-03-24 — End: 2019-03-24
  Administered 2019-03-24: 1000 mL via INTRAVENOUS

## 2019-03-24 MED ORDER — HYDROMORPHONE HCL PF 0.5 MG/0.5 ML IJ SOLN *I*
0.5000 mg | Freq: Once | INTRAMUSCULAR | Status: AC
Start: 2019-03-24 — End: 2019-03-24
  Administered 2019-03-24: 0.5 mg via INTRAVENOUS
  Filled 2019-03-24: qty 0.5

## 2019-03-24 NOTE — Discharge Instructions (Signed)
See GYN doctor for followup    Return to ER for severe pain, severe bleeding or any concerns.

## 2019-03-24 NOTE — ED Notes (Addendum)
Discharge instructions reviewed with patient and patient verbalized understanding. IV removed, belongings returned to patient. Patient ambulatory out of ED, VSS at time of D/C.  Prescription given and medications, PO hydration, fiber intake, heat and OTC pain management discussed with patient who verbalized understanding. Patient calling friend for ride home. Patient reports she is following up with OBGYN for biopsy tomorrow.  Discharge Instructions reviewed with patient.  The patient expressed understanding and is ready to be discharged.  Due to COVID-19 Infection Risk Reduction measures, patient and [facility] staff member are not using a pen to sign the Discharge Instructions.

## 2019-03-24 NOTE — ED Triage Notes (Signed)
Pt comes into the ED today by car for pelvic pain along with back pain along with nausea.  Pt states that the pain goes from her pelvic area up her back 10/10.  Pt was seen here in the ED on 03/22/2019 dx with pelvic pain.  Pt saw her PMD on 12/20/2018 started on Meloxicam 7.5mg  at 12:30.        Triage Note   Gertie Baron, RN

## 2019-03-24 NOTE — ED Provider Notes (Signed)
History     Chief Complaint   Patient presents with    Pelvic Pain    Back Pain     28 year old with history of endometriosis and 2 recent ED visits for abdominal and pelvic cramping.  Ultrasound, CT, swabs performed, urinalysis performed.  Patient reports resolution of symptoms for 2 days however pain returned today while shopping.  Reports bilateral pelvic discomfort.  She is currently menstruating and has an IUD.  Denies lightheadedness or dizziness.  Reports mild nausea.  No relief with pain when taking meloxicam          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    Depression     Endometriosis 2011    Fibromyalgia     Hypothyroid     12/14 denies     Migraines     Seizures     last 03/2017 in context abrupt withdrawl from medication    SVT (supraventricular tachycardia) 2013        Patient Active Problem List   Diagnosis Code    Calculi, ureter N20.1    GERD (gastroesophageal reflux disease) K21.9    Hypothyroid E03.9    Bronchitis J40    Pneumonia J18.9    Anxiety F41.9    Depression F32.9    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    Renal calculi N20.0    SVT (supraventricular tachycardia) I47.1    Fibromyalgia M79.7    Positive ANA (antinuclear antibody) R76.8    Major depressive disorder, recurrent, moderate F33.1    PTSD (post-traumatic stress disorder) F43.10            Past Surgical History:   Procedure Laterality Date    APPENDECTOMY      BUNIONECTOMY      COLONOSCOPY      lithotripsy       Family History   Problem Relation Age of Onset    Anxiety disorder Mother     Arthritis Mother     Cancer Mother     Depression Mother     Emphysema Mother     Arthritis Father           Social History     Tobacco Use    Smoking status: Current Every Day Smoker     Packs/day: 0.50     Years: 5.00     Pack years: 2.50     Types: Cigarettes     Smokeless tobacco: Never Used   Substance Use Topics    Alcohol use: Not Currently     Alcohol/week: 2.0 standard drinks     Types: 2 Glasses of wine per week     Comment: denies recent use    Drug use: Yes     Types: Marijuana     Comment: medical marijuana pt     Living Situation     Questions Responses    Patient lives with Significant Other    Comment: boyfriend     Homeless Yes    Comment: "we're kinda homeless" (moving around from place to place.     Caregiver for other family member No    External Services Mental Health Services    Comment: [facility] mental health ("dismissed" from [place] counseling last month)     Employment Unemployed  Comment: [employer]      Domestic Violence Risk No                Review of Systems   Review of Systems   Constitutional: Positive for appetite change. Negative for activity change.   HENT: Negative for congestion and facial swelling.    Respiratory: Negative for chest tightness and shortness of breath.    Cardiovascular: Negative for chest pain and palpitations.   Gastrointestinal: Positive for abdominal pain and nausea.   Endocrine: Negative for cold intolerance and heat intolerance.   Genitourinary: Positive for flank pain and pelvic pain.   Musculoskeletal: Positive for back pain. Negative for myalgias.   Neurological: Positive for numbness. Negative for weakness.   Hematological: Negative for adenopathy. Does not bruise/bleed easily.   Psychiatric/Behavioral: Negative for behavioral problems and confusion.       Physical Exam     Triage Vitals  Triage Start: Start, (03/24/19 1402)   First Recorded BP: 126/77, Resp: 18, Temp: 36.7 C (98.1 F), Temp src: TEMPORAL Oxygen Therapy SpO2: 96 %, O2 Device: None (Room air), Heart Rate: (!) 113, (03/24/19 1334) Heart Rate (via Pulse Ox): (!) 113, (03/24/19 1334).  First Pain Reported  0-10 Scale: 10, Pain Location/Orientation: Back, (03/24/19 1334)       Physical Exam  Vitals signs and nursing note reviewed.    Constitutional:       Appearance: Normal appearance. She is not ill-appearing or diaphoretic.   HENT:      Head: Normocephalic and atraumatic.   Eyes:      Extraocular Movements: Extraocular movements intact.      Conjunctiva/sclera: Conjunctivae normal.      Pupils: Pupils are equal, round, and reactive to light.   Cardiovascular:      Rate and Rhythm: Tachycardia present.      Pulses: Normal pulses.   Pulmonary:      Effort: Pulmonary effort is normal. No respiratory distress.      Breath sounds: No wheezing.   Abdominal:      Palpations: Abdomen is soft.      Tenderness: There is abdominal tenderness. There is no right CVA tenderness, left CVA tenderness or guarding.   Musculoskeletal:         General: No swelling.      Right lower leg: No edema.      Left lower leg: No edema.   Skin:     Coloration: Skin is not pale.      Findings: No erythema or rash.   Neurological:      General: No focal deficit present.      Mental Status: She is alert and oriented to person, place, and time.      Sensory: No sensory deficit.      Motor: No weakness.   Psychiatric:         Mood and Affect: Mood normal.         Behavior: Behavior normal.         Thought Content: Thought content normal.         Medical Decision Making     Assessment:  28 year old who returns with pelvic and abdominal discomfort after 2 prior recent ED visits with ultrasound, CT scan, urinalysis and pelvic cultures performed without diagnosis.  Ultrasound results reviewed.  There was partial visualization of pelvic organs but not a comprehensive assessment of adnexa.  However the nature of pain is bilateral today and a unilateral ovarian torsion seems unlikely.  We will  obtain a lactic acid.  Pelvic exam deferred given recent evaluation    Differential diagnosis:  Dysfunctional uterine bleeding, endometriosis, less likely displaced intrauterine device or ovarian torsion based on recent evaluation    Plan:  Orders Placed This Encounter      CBC and  differential      Comprehensive metabolic panel      Lactate, plasma      Insert peripheral IV        ED Course and Disposition:  Labs are reassuring.  Pain improved.  Tachycardia improved.  Patient has follow-up with GYN tomorrow.  Recommend Motrin and  over meloxicam.  We'll add opioid for comfort.            Bary Leriche, MD          Bary Leriche, MD  03/25/19 2111

## 2019-03-25 ENCOUNTER — Other Ambulatory Visit
Admission: RE | Admit: 2019-03-25 | Discharge: 2019-03-25 | Disposition: A | Discharge status: Home / Self Care | Payer: Medicaid Other | Source: Ambulatory Visit | Attending: Pathology | Admitting: Pathology

## 2019-03-25 DIAGNOSIS — R87610 Atypical squamous cells of undetermined significance on cytologic smear of cervix (ASC-US): Secondary | ICD-10-CM | POA: Insufficient documentation

## 2019-03-29 LAB — SURGICAL PATHOLOGY

## 2019-03-30 ENCOUNTER — Emergency Department
Admission: EM | Admit: 2019-03-30 | Discharge: 2019-03-30 | Disposition: A | Discharge status: Home / Self Care | Payer: Medicaid Other | Source: Ambulatory Visit | Attending: Emergency Medicine | Admitting: Emergency Medicine

## 2019-03-30 DIAGNOSIS — R269 Unspecified abnormalities of gait and mobility: Secondary | ICD-10-CM

## 2019-03-30 DIAGNOSIS — M7989 Other specified soft tissue disorders: Secondary | ICD-10-CM

## 2019-03-30 DIAGNOSIS — M5416 Radiculopathy, lumbar region: Secondary | ICD-10-CM

## 2019-03-30 DIAGNOSIS — M545 Low back pain, unspecified: Secondary | ICD-10-CM

## 2019-03-30 DIAGNOSIS — F17210 Nicotine dependence, cigarettes, uncomplicated: Secondary | ICD-10-CM | POA: Insufficient documentation

## 2019-03-30 DIAGNOSIS — Z590 Homelessness: Secondary | ICD-10-CM | POA: Insufficient documentation

## 2019-03-30 MED ORDER — KETOROLAC TROMETHAMINE 30 MG/ML IJ SOLN *I*
30.0000 mg | Freq: Once | INTRAMUSCULAR | Status: AC
Start: 2019-03-30 — End: 2019-03-30
  Administered 2019-03-30: 30 mg via INTRAMUSCULAR
  Filled 2019-03-30: qty 1

## 2019-03-30 MED ORDER — PREDNISONE 20 MG PO TABS *I*
40.0000 mg | ORAL_TABLET | Freq: Once | ORAL | Status: AC
Start: 2019-03-30 — End: 2019-03-30
  Administered 2019-03-30: 40 mg via ORAL
  Filled 2019-03-30: qty 2

## 2019-03-30 NOTE — ED Provider Notes (Signed)
History     Chief Complaint   Patient presents with    Back Pain     28 year old white female with history of chronic pelvic pain and back pain.  She reports having history of endometriosis and fibromyalgia.  The patient has had several visits in the emergency room for evaluation and treatment of the chronic pain syndrome affecting her back and pelvic regions.  Imaging studies including ultrasound and CT scan have failed to show a definitive cause for her pain.  She states that the back pain commenced approximately 2 weeks ago and is located in the lower, mid lumbar spine region.  She has not had trauma.  On occasion, the patient feels a grinding sensation in the lower back with pain that radiates into both buttocks.  She has not had difficulty with urination or defecation.  Describes having no lower extremity numbness or weakness.  Denies having abdominal pain, diarrhea or blood per rectum.          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    Depression     Endometriosis 2011    Fibromyalgia     Hypothyroid     12/14 denies     Migraines     Seizures     last 03/2017 in context abrupt withdrawl from medication    SVT (supraventricular tachycardia) 2013        Patient Active Problem List   Diagnosis Code    Calculi, ureter N20.1    GERD (gastroesophageal reflux disease) K21.9    Hypothyroid E03.9    Bronchitis J40    Pneumonia J18.9    Anxiety F41.9    Depression F32.9    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    Renal calculi N20.0    SVT (supraventricular tachycardia) I47.1    Fibromyalgia M79.7    Positive ANA (antinuclear antibody) R76.8    Major depressive disorder, recurrent, moderate F33.1    PTSD (post-traumatic stress disorder) F43.10            Past Surgical History:   Procedure Laterality Date    APPENDECTOMY      BUNIONECTOMY       COLONOSCOPY      lithotripsy       Family History   Problem Relation Age of Onset    Anxiety disorder Mother     Arthritis Mother     Cancer Mother     Depression Mother     Emphysema Mother     Arthritis Father           Social History     Tobacco Use    Smoking status: Current Every Day Smoker     Packs/day: 0.50     Years: 5.00     Pack years: 2.50     Types: Vaping    Smokeless tobacco: Never Used   Substance Use Topics    Alcohol use: Not Currently     Alcohol/week: 2.0 standard drinks     Types: 2 Glasses of wine per week     Comment: denies recent use    Drug use: Not Currently     Types: Marijuana     Comment: medical marijuana pt     Living Situation     Questions Responses  Patient lives with Significant Other    Comment: boyfriend     Homeless Yes    Comment: "we're kinda homeless" (moving around from place to place.     Caregiver for other family member No    External Services Mental Health Services    Comment: [facility] mental health ("dismissed" from [place] counseling last month)     Employment Unemployed    Comment: [place] No                Review of Systems   Review of Systems   Constitutional: Positive for activity change. Negative for chills and fever.   HENT: Negative for congestion, sore throat and voice change.    Eyes: Negative for photophobia and visual disturbance.   Respiratory: Negative for chest tightness and shortness of breath.    Cardiovascular: Positive for leg swelling. Negative for chest pain and palpitations.   Gastrointestinal: Negative for abdominal pain, blood in stool, diarrhea, nausea and vomiting.   Endocrine: Negative for polydipsia and polyphagia.   Genitourinary: Positive for pelvic pain. Negative for difficulty urinating, dysuria, flank pain, frequency, hematuria and vaginal bleeding.   Musculoskeletal: Positive for back pain and gait problem. Negative for joint swelling, myalgias, neck pain and neck stiffness.   Skin:  Negative for rash.   Allergic/Immunologic: Negative for immunocompromised state.   Neurological: Negative for dizziness, syncope, speech difficulty, weakness, numbness and headaches.   Hematological: Negative.        Physical Exam     Triage Vitals      First Recorded BP: 133/78, Resp: 16, Temp: 36.4 C (97.5 F) Oxygen Therapy SpO2: 91 %, Oximetry Source: Lt Hand, O2 Device: None (Room air),    .  First Pain Reported  0-10 Scale: 10, Pain Location/Orientation: Back;Leg Left;Leg Right, (03/30/19 1643)       Physical Exam  Vitals signs and nursing note reviewed.   Constitutional:       General: She is not in acute distress.     Appearance: She is not toxic-appearing.   HENT:      Head: Normocephalic.   Eyes:      Extraocular Movements: Extraocular movements intact.      Pupils: Pupils are equal, round, and reactive to light.   Neck:      Musculoskeletal: Normal range of motion and neck supple.   Cardiovascular:      Rate and Rhythm: Normal rate and regular rhythm.      Pulses: Normal pulses.      Heart sounds: No murmur.   Pulmonary:      Effort: Pulmonary effort is normal. No respiratory distress.   Musculoskeletal: Normal range of motion.         General: No swelling or tenderness.      Right lower leg: No edema.      Left lower leg: No edema.   Skin:     General: Skin is warm and dry.      Capillary Refill: Capillary refill takes less than 2 seconds.      Coloration: Skin is not jaundiced or pale.   Neurological:      General: No focal deficit present.      Mental Status: She is alert and oriented to person, place, and time.      Sensory: No sensory deficit.      Motor: No weakness.      Coordination: Coordination normal.      Gait:  Gait normal.      Deep Tendon Reflexes: Reflexes normal.      Comments: Patellar and Achilles tendon reflexes 2+/4+.  No ankle jerk clonus.  Babinski testing shows downgoing toes bilaterally.  Extremity sensation intact to light touch.  Extremity strength 5/5 with normal tone.  Speech  and mentation clear.   Psychiatric:         Thought Content: Thought content normal.         Medical Decision Making   Patient seen by me on:  03/30/2019    Assessment:  28 year old white female with history of chronic pelvic and low back pain with several recent visits with extensive evaluation including ultrasound and CT imaging showing no acute pathology.  Patient presents with complaint of ongoing pain in the lower back with radiation of discomfort into the buttocks bilaterally over the past couple weeks.  Reports no trauma.  Has not had difficulty with urination or defecation.  Ambulating over long distances tends to increase the low back pain.    Differential diagnosis:  Disc related radiculopathy, vertebral facet inflammation, spinal stenosis    Plan:  Chart review of recent ED visits, detailed neurological examination, intramuscular Toradol and oral prednisone 40 mg.    Independent review of: chart/prior records    ED Course and Disposition:  The patient requested Dilaudid for her pain.  A discussion ensued regarding the likely adverse effects of taking a potent narcotic for her type of pain.  The patient was agreeable to a regular regimen of Tylenol and Advil and to start a course of prednisone 40 mg daily for 5 days.  The patient will benefit from early follow-up with her primary care physician for referral to physical therapy.  Should she have persisting pain in the lower back beyond 1 month or if she were to develop any neurologic symptoms, and MR image of the lumbar spine would be indicated.       ED Course as of Mar 30 1600   Sat Mar 30, 2019   [id number] The patient was given Toradol 30 mg IM.  She had requested a continuation of her current prescription for oral Dilaudid.  However, the patient was advised that taking a potent narcotic medication for this condition is not indicated as there is a high risk that the patient could become addicted to the narcotic medication.  Will substitute Tylenol 1000 mg and  Advil 400 mg every 8 hours as needed for pain.  She will be placed on a prednisone course 40 mg daily for 5 days.  She will follow-up with her primary care physician, Dr. Isaiah SergePhilip Meeker, in 5 days for further evaluation.  For continuing pain, the patient may require MR imaging of the lumbosacral spine to assess for disc disease.  No current evidence of infection of the bone or disc space.  The patient has had previous ED work-up with normal white blood cell count and negative imaging of the abdomen CT scanning.        No orders of the defined types were placed in this encounter.    Impression: 2-week history of atraumatic low back pain with radiation to the buttocks bilaterally without evidence of cauda equina syndrome, discitis, vertebral osteomyelitis or epidural abscess.    Plan: Follow-up with the primary care provider in approximately 3 to 5 days.  Start prednisone 40 mg once daily for 5 days.  Substitute Tylenol and Advil for narcotic therapy given the risk for dependency on narcotic medications.  MR  imaging of the lumbar spine if she develops increasing pain, gait instability or neurological deficits.      Eligha Bridegroomavid E Wynton Hufstetler, MD          Eligha BridegroomBaum, Keatyn Jawad E, MD  03/31/19 [id number]

## 2019-03-30 NOTE — ED Triage Notes (Signed)
Patient arrives for evaluation of lower back pain. Per patient, lower back pain ongoing for 2 weeks. Patient states also has swelling in the legs. Patient states "Feel like something is grinding in my back".           Triage Note   Lujean Rave, RN

## 2019-03-30 NOTE — Discharge Instructions (Addendum)
Light activity for the next 3 to 5 days.  Apply heat to the lower back.  Take prednisone 40 mg daily for the next 4 days.  Take Tylenol 1000 mg and Advil 400 mg every 8 hours as needed for mild to moderate pain.  A work excuse for the next 3 days has been issued.  Return to the emergency room for increasing pain, difficulty moving the bowels or urinating or onset of fever.

## 2019-04-01 ENCOUNTER — Other Ambulatory Visit: Payer: Self-pay | Admitting: Family

## 2019-04-01 MED ORDER — PREDNISONE 20 MG PO TABS *I*
40.0000 mg | ORAL_TABLET | Freq: Every day | ORAL | 0 refills | Status: AC
Start: 2019-04-01 — End: 2019-04-05

## 2019-05-04 ENCOUNTER — Ambulatory Visit
Admission: AD | Admit: 2019-05-04 | Discharge: 2019-05-04 | Disposition: A | Discharge status: Home / Self Care | Payer: Medicaid Other | Source: Ambulatory Visit | Attending: Emergency Medicine | Admitting: Emergency Medicine

## 2019-05-04 DIAGNOSIS — Z7189 Other specified counseling: Secondary | ICD-10-CM

## 2019-05-04 DIAGNOSIS — B349 Viral infection, unspecified: Secondary | ICD-10-CM

## 2019-05-04 DIAGNOSIS — R1110 Vomiting, unspecified: Secondary | ICD-10-CM | POA: Insufficient documentation

## 2019-05-04 DIAGNOSIS — F17290 Nicotine dependence, other tobacco product, uncomplicated: Secondary | ICD-10-CM | POA: Insufficient documentation

## 2019-05-04 DIAGNOSIS — Z20828 Contact with and (suspected) exposure to other viral communicable diseases: Secondary | ICD-10-CM | POA: Insufficient documentation

## 2019-05-04 DIAGNOSIS — R42 Dizziness and giddiness: Secondary | ICD-10-CM | POA: Insufficient documentation

## 2019-05-04 NOTE — UC Provider Note (Signed)
History     Chief Complaint   Patient presents with    Dizziness     Pt comes to uc for several days of congestion - lightheadedness - fatigue - vomiting ---covid concern          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    Depression     Endometriosis 2011    Fibromyalgia     Hypothyroid     12/14 denies     Migraines     Seizures     last 03/2017 in context abrupt withdrawl from medication    SVT (supraventricular tachycardia) 2013        Patient Active Problem List   Diagnosis Code    Calculi, ureter N20.1    GERD (gastroesophageal reflux disease) K21.9    Hypothyroid E03.9    Bronchitis J40    Pneumonia J18.9    Anxiety F41.9    Depression F32.9    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    Renal calculi N20.0    SVT (supraventricular tachycardia) I47.1    Fibromyalgia M79.7    Positive ANA (antinuclear antibody) R76.8    Major depressive disorder, recurrent, moderate F33.1    PTSD (post-traumatic stress disorder) F43.10            Past Surgical History:   Procedure Laterality Date    APPENDECTOMY      BUNIONECTOMY      COLONOSCOPY      lithotripsy       Family History   Problem Relation Age of Onset    Anxiety disorder Mother     Arthritis Mother     Cancer Mother     Depression Mother     Emphysema Mother     Arthritis Father           Social History     Tobacco Use    Smoking status: Current Every Day Smoker     Packs/day: 0.50     Years: 5.00     Pack years: 2.50     Types: Vaping    Smokeless tobacco: Never Used   Substance Use Topics    Alcohol use: Not Currently     Alcohol/week: 2.0 standard drinks     Types: 2 Glasses of wine per week     Comment: denies recent use    Drug use: Not Currently     Types: Marijuana     Comment: medical marijuana pt     Living Situation     Questions Responses    Patient lives with Significant Other     Comment: boyfriend     Homeless Yes    Comment: "we're kinda homeless" (moving around from place to place.     Caregiver for other family member No    External Services Mental Health Services    Comment: [facility] ("dismissed" from [place] counseling last month)     Employment Unemployed    Comment: [place] No                Review of Systems   Review of Systems   Constitutional: Positive for fatigue.   HENT: Positive for congestion. Negative for ear pain and sore throat.  Respiratory: Negative for cough and shortness of breath.    Gastrointestinal: Positive for nausea and vomiting. Negative for abdominal pain and diarrhea.   Musculoskeletal: Negative for myalgias.   Neurological: Positive for light-headedness.       Physical Exam   Triage Vitals  Triage Start: Start, (05/04/19 1311)   First Recorded  ,    .      Physical Exam  Vitals signs and nursing note reviewed.   Constitutional:       General: She is in acute distress.      Appearance: She is ill-appearing. She is not toxic-appearing.   HENT:      Head: Normocephalic.      Right Ear: Tympanic membrane normal.      Left Ear: Tympanic membrane normal.      Nose: Nose normal.      Mouth/Throat:      Mouth: Mucous membranes are moist.      Pharynx: No oropharyngeal exudate or posterior oropharyngeal erythema.   Eyes:      Extraocular Movements: Extraocular movements intact.      Pupils: Pupils are equal, round, and reactive to light.   Neck:      Musculoskeletal: Normal range of motion and neck supple.   Cardiovascular:      Rate and Rhythm: Normal rate and regular rhythm.   Pulmonary:      Effort: Pulmonary effort is normal. No respiratory distress.      Breath sounds: No wheezing, rhonchi or rales.   Musculoskeletal: Normal range of motion.   Skin:     General: Skin is warm and dry.   Neurological:      General: No focal deficit present.      Mental Status: She is alert and oriented to person, place, and  time.          Medical Decision Making        Initial Evaluation:  ED First Provider Contact     Date/Time Event User Comments    05/04/19 1314 ED First Provider Contact Vernor Monnig D Initial Face to Face Provider Contact          Patient was seen on: 05/04/2019        Assessment:  28 y.o.female [facility] with uri symptoms - some vomiting and lightheadedness    Differential Diagnosis includes:  Viral syndrome - covid  - uri     Plan: covid test  - home for symptomatic tx  - fever control - follow up with pmd          Final Diagnosis  Final diagnoses:   [R42] Dizziness (Primary)   [B34.9] Viral syndrome   [Z71.89] Advice given about 2019-nCoV infection         Perley Jain, MD              Mardee Clune, Jewel Baize, MD  05/04/19 1404

## 2019-05-04 NOTE — Discharge Instructions (Signed)
Plenty of fluids - tylenol and ibuprofen for pain or fever - covid test done - results usually in 2-5 days - someone will call you with the results - also will be in My Chart - follow up with your doctor if not improving over the next week

## 2019-05-04 NOTE — ED Triage Notes (Signed)
Patient here with emesis, chills, ftigue and cold sweets      Triage Note   Almon Hercules, LPN

## 2019-05-06 ENCOUNTER — Telehealth: Payer: Self-pay

## 2019-05-06 LAB — COVID-19 NAAT (PCR): COVID-19 NAAT (PCR): NEGATIVE

## 2019-05-06 LAB — COVID-19 PCR

## 2019-05-06 NOTE — Telephone Encounter (Signed)
-----   Message from Arieal Cook, NP sent at 05/06/2019  8:02 AM EDT -----  Please inform negative covid test

## 2019-05-06 NOTE — Telephone Encounter (Signed)
Left voicemail to return call for results 

## 2019-05-07 ENCOUNTER — Telehealth: Payer: Self-pay | Admitting: Emergency Medicine

## 2019-05-07 NOTE — Telephone Encounter (Signed)
Informed patient of negative covid test.  Requested copy of lab results to be placed up front for pickup later.

## 2019-05-19 ENCOUNTER — Emergency Department
Admission: EM | Admit: 2019-05-19 | Discharge: 2019-05-19 | Disposition: A | Discharge status: Home / Self Care | Payer: Medicaid Other | Source: Ambulatory Visit | Attending: Physician Assistant | Admitting: Physician Assistant

## 2019-05-19 DIAGNOSIS — R519 Headache, unspecified: Secondary | ICD-10-CM | POA: Insufficient documentation

## 2019-05-19 DIAGNOSIS — R5383 Other fatigue: Secondary | ICD-10-CM | POA: Insufficient documentation

## 2019-05-19 DIAGNOSIS — R112 Nausea with vomiting, unspecified: Secondary | ICD-10-CM | POA: Insufficient documentation

## 2019-05-19 DIAGNOSIS — G43111 Migraine with aura, intractable, with status migrainosus: Secondary | ICD-10-CM

## 2019-05-19 DIAGNOSIS — H53143 Visual discomfort, bilateral: Secondary | ICD-10-CM | POA: Insufficient documentation

## 2019-05-19 DIAGNOSIS — H53149 Visual discomfort, unspecified: Secondary | ICD-10-CM

## 2019-05-19 LAB — URINE MICROSCOPIC (IQ200)

## 2019-05-19 LAB — URINALYSIS REFLEX TO CULTURE
Glucose,UA: NEGATIVE mg/dL
Ketones, UA: NEGATIVE mg/dL
Leuk Esterase,UA: NEGATIVE
Nitrite,UA: NEGATIVE
Specific Gravity,UA: 1.032 — ABNORMAL HIGH (ref 1.002–1.030)
pH,UA: 5.5 (ref 5.0–8.0)

## 2019-05-19 LAB — CBC AND DIFFERENTIAL
Baso # K/uL: 0 10*3/uL (ref 0.0–0.1)
Basophil %: 0.3 %
Eos # K/uL: 0.1 10*3/uL (ref 0.0–0.4)
Eosinophil %: 0.5 %
Hematocrit: 41 % (ref 34–45)
Hemoglobin: 13.9 g/dL (ref 11.2–15.7)
IMM Granulocytes #: 0 10*3/uL (ref 0.0–0.0)
IMM Granulocytes: 0.4 %
Lymph # K/uL: 2.2 10*3/uL (ref 1.2–3.7)
Lymphocyte %: 23.1 %
MCH: 31 pg/cell (ref 26–32)
MCHC: 34 g/dL (ref 32–36)
MCV: 93 fL (ref 79–95)
Mono # K/uL: 0.7 10*3/uL (ref 0.2–0.9)
Monocyte %: 7.4 %
Neut # K/uL: 6.6 10*3/uL — ABNORMAL HIGH (ref 1.6–6.1)
Nucl RBC # K/uL: 0 10*3/uL (ref 0.0–0.0)
Nucl RBC %: 0 /100 WBC (ref 0.0–0.2)
Platelets: 304 10*3/uL (ref 160–370)
RBC: 4.4 MIL/uL (ref 3.9–5.2)
RDW: 12.6 % (ref 11.7–14.4)
Seg Neut %: 68.3 %
WBC: 9.6 10*3/uL (ref 4.0–10.0)

## 2019-05-19 LAB — COMPREHENSIVE METABOLIC PANEL
ALT: 13 U/L (ref 0–35)
AST: 17 U/L (ref 0–35)
Albumin: 4 g/dL (ref 3.5–5.2)
Alk Phos: 101 U/L (ref 35–105)
Anion Gap: 8 (ref 7–16)
Bilirubin,Total: 0.3 mg/dL (ref 0.0–1.2)
CO2: 25 mmol/L (ref 20–28)
Calcium: 9 mg/dL (ref 8.8–10.2)
Chloride: 102 mmol/L (ref 96–108)
Creatinine: 0.73 mg/dL (ref 0.51–0.95)
GFR,Black: 130 *
GFR,Caucasian: 113 *
Glucose: 99 mg/dL (ref 60–99)
Lab: 16 mg/dL (ref 6–20)
Potassium: 3.9 mmol/L (ref 3.4–4.7)
Sodium: 135 mmol/L (ref 133–145)
Total Protein: 7.8 g/dL — ABNORMAL HIGH (ref 6.3–7.7)

## 2019-05-19 LAB — PREGNANCY, URINE: Preg Test,UR: NEGATIVE

## 2019-05-19 MED ORDER — SODIUM CHLORIDE 0.9 % IV BOLUS *I*
1000.0000 mL | Freq: Once | Status: AC
Start: 2019-05-19 — End: 2019-05-19
  Administered 2019-05-19: 1000 mL via INTRAVENOUS

## 2019-05-19 MED ORDER — DIPHENHYDRAMINE HCL 50 MG/ML IJ SOLN *I*
12.5000 mg | Freq: Once | INTRAMUSCULAR | Status: AC
Start: 2019-05-19 — End: 2019-05-19
  Administered 2019-05-19: 12.5 mg via INTRAVENOUS
  Filled 2019-05-19: qty 1

## 2019-05-19 MED ORDER — SODIUM CHLORIDE 0.9 % 100 ML IV SOLN *I*
5.0000 mg | Freq: Once | INTRAVENOUS | Status: AC
Start: 2019-05-19 — End: 2019-05-19
  Administered 2019-05-19: 5 mg via INTRAVENOUS
  Filled 2019-05-19: qty 2

## 2019-05-19 MED ORDER — KETOROLAC TROMETHAMINE 30 MG/ML IJ SOLN *I*
30.0000 mg | Freq: Once | INTRAMUSCULAR | Status: AC
Start: 2019-05-19 — End: 2019-05-19
  Administered 2019-05-19: 30 mg via INTRAVENOUS
  Filled 2019-05-19: qty 1

## 2019-05-19 NOTE — ED Notes (Signed)
Bed: TED-21  Expected date:   Expected time:   Means of arrival:   Comments:  WR

## 2019-05-19 NOTE — ED Provider Notes (Signed)
History     Chief Complaint   Patient presents with    Headache     28 year old female with past medical history of migraines, anxiety, and fibromyalgia presents to emergency department for evaluation of a left-sided headache ongoing for 1 week.  Patient states it feels similar to prior migraines.  She can be probed without relief.  She reports associated photophobia, nausea and vomiting.  She denies fevers, chills, chest pain or short of breath.  Patient reports body aches.  Denies any numbness, tingling, weakness, dizziness or lightheadedness.          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    Depression     Endometriosis 2011    Fibromyalgia     Hypothyroid     12/14 denies     Migraines     Seizures     last 03/2017 in context abrupt withdrawl from medication    SVT (supraventricular tachycardia) 2013        Patient Active Problem List   Diagnosis Code    Calculi, ureter N20.1    GERD (gastroesophageal reflux disease) K21.9    Hypothyroid E03.9    Bronchitis J40    Pneumonia J18.9    Anxiety F41.9    Depression F32.9    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    Renal calculi N20.0    SVT (supraventricular tachycardia) I47.1    Fibromyalgia M79.7    Positive ANA (antinuclear antibody) R76.8    Major depressive disorder, recurrent, moderate F33.1    PTSD (post-traumatic stress disorder) F43.10            Past Surgical History:   Procedure Laterality Date    APPENDECTOMY      BUNIONECTOMY      COLONOSCOPY      lithotripsy       Family History   Problem Relation Age of Onset    Anxiety disorder Mother     Arthritis Mother     Cancer Mother     Depression Mother     Emphysema Mother     Arthritis Father           Social History     Tobacco Use    Smoking status: Current Every Day Smoker     Packs/day: 0.50     Years: 5.00     Pack years: 2.50      Types: Vaping    Smokeless tobacco: Never Used   Substance Use Topics    Alcohol use: Not Currently     Alcohol/week: 2.0 standard drinks     Types: 2 Glasses of wine per week     Comment: denies recent use    Drug use: Not Currently     Types: Marijuana     Comment: medical marijuana pt     Living Situation     Questions Responses    Patient lives with Significant Other    Comment: boyfriend     Homeless Yes    Comment: "we're kinda homeless" (moving around from place to place.     Caregiver for other family member No    External Services Mental Health Services    Comment: [facility] ("dismissed" from [place] [facility] last month)     Employment Unemployed  Comment: [employer]      Domestic Violence Risk No                Review of Systems   Review of Systems   Constitutional: Positive for fatigue. Negative for appetite change, chills and fever.   HENT: Negative for congestion and sore throat.    Eyes: Positive for photophobia.   Respiratory: Negative for cough, chest tightness, shortness of breath and wheezing.    Cardiovascular: Negative for chest pain.   Gastrointestinal: Positive for nausea and vomiting. Negative for abdominal pain, constipation and diarrhea.   Genitourinary: Negative for dysuria and frequency.   Musculoskeletal: Negative for myalgias.   Neurological: Positive for headaches. Negative for dizziness, syncope, weakness, light-headedness and numbness.   All other systems reviewed and are negative.      Physical Exam     Triage Vitals      First Recorded BP: 113/71, Resp: 18, Temp: 36.2 C (97.2 F), Temp src: TEMPORAL Oxygen Therapy SpO2: 97 %, Oximetry Source: Lt Hand, O2 Device: None (Room air), Heart Rate: 95, (05/19/19 1312) Heart Rate (via Pulse Ox): 84, (05/19/19 1419).  First Pain Reported  0-10 Scale: 10, Pain Location/Orientation: Head, (05/19/19 1308)       Physical Exam  Vitals signs and nursing note reviewed.   Constitutional:       Appearance: Normal  appearance. She is well-developed.   HENT:      Head: Normocephalic and atraumatic.   Eyes:      Extraocular Movements: Extraocular movements intact.      Pupils: Pupils are equal, round, and reactive to light.      Comments: + photophobia   Neck:      Musculoskeletal: Normal range of motion and neck supple. Normal range of motion. No neck rigidity or pain with movement.   Cardiovascular:      Rate and Rhythm: Normal rate and regular rhythm.      Pulses: Normal pulses.      Heart sounds: Normal heart sounds.   Pulmonary:      Effort: Pulmonary effort is normal.      Breath sounds: Normal breath sounds.   Abdominal:      General: Bowel sounds are normal.      Palpations: Abdomen is soft.      Tenderness: There is no abdominal tenderness.   Musculoskeletal: Normal range of motion.   Skin:     General: Skin is warm and dry.   Neurological:      Mental Status: She is alert and oriented to person, place, and time.      Cranial Nerves: Cranial nerves are intact.      Sensory: Sensation is intact.      Motor: Motor function is intact.      Gait: Gait is intact.         Medical Decision Making   Patient seen by me on:  05/19/2019    Assessment:  28 year old female presents emergency room for evaluation of persistent headache 1 week.  History of migraines.    Differential diagnosis:  Migraine headache, tension headache, cluster headache, dehydration, electrolyte abnormality    Plan:  Orders Placed This Encounter      Urinalysis reflex to culture      CBC and differential      Comprehensive metabolic panel      Urinalysis with reflex to Microscopic UA and reflex to Bacterial Culture      Pregnancy, urine  Insert peripheral IV        ED Course and Disposition:  Labs unremarkable  Headache gone after medications.   Requesting sandwich- advised her she is safe for discharge and can follow up with PCP.          ED Course as of May 18 1712   Sun May 19, 2019   1521 WBC: 9.6     Discussed case with Dr. Clearence Ped. Provider agrees  with work up and treatment plan.     [address], [place]          Pilar Jarvis [place], [place]  05/19/19 [phone]

## 2019-05-19 NOTE — ED Triage Notes (Signed)
Presents today stating that she has had a headache for 1 week. Reports photophobia, subjective fever and vomiting.       Triage Note   Rushi Chasen Lewis [place], RN

## 2019-05-19 NOTE — Discharge Instructions (Signed)
Continue ibuprofen and tylenol for the next 12 hours. Take benadryl before bed  Push fluids  Return with worsening headache uncontrolled with medication, vomiting or fevers.

## 2019-06-04 ENCOUNTER — Emergency Department
Admission: EM | Admit: 2019-06-04 | Discharge: 2019-06-04 | Disposition: A | Discharge status: Home / Self Care | Payer: Medicaid Other | Source: Ambulatory Visit | Attending: Emergency Medicine | Admitting: Emergency Medicine

## 2019-06-04 ENCOUNTER — Emergency Department: Payer: Medicaid Other

## 2019-06-04 DIAGNOSIS — R0602 Shortness of breath: Secondary | ICD-10-CM

## 2019-06-04 DIAGNOSIS — R079 Chest pain, unspecified: Secondary | ICD-10-CM

## 2019-06-04 DIAGNOSIS — R0781 Pleurodynia: Secondary | ICD-10-CM

## 2019-06-04 DIAGNOSIS — M79604 Pain in right leg: Secondary | ICD-10-CM

## 2019-06-04 DIAGNOSIS — M79651 Pain in right thigh: Secondary | ICD-10-CM

## 2019-06-04 DIAGNOSIS — R0600 Dyspnea, unspecified: Secondary | ICD-10-CM

## 2019-06-04 DIAGNOSIS — R1013 Epigastric pain: Secondary | ICD-10-CM

## 2019-06-04 DIAGNOSIS — F17210 Nicotine dependence, cigarettes, uncomplicated: Secondary | ICD-10-CM | POA: Insufficient documentation

## 2019-06-04 LAB — CBC AND DIFFERENTIAL
Baso # K/uL: 0 10*3/uL (ref 0.0–0.1)
Basophil %: 0.3 %
Eos # K/uL: 0.1 10*3/uL (ref 0.0–0.4)
Eosinophil %: 1.2 %
Hematocrit: 41 % (ref 34–45)
Hemoglobin: 13.2 g/dL (ref 11.2–15.7)
IMM Granulocytes #: 0 10*3/uL (ref 0.0–0.0)
IMM Granulocytes: 0.2 %
Lymph # K/uL: 2.4 10*3/uL (ref 1.2–3.7)
Lymphocyte %: 23.2 %
MCH: 31 pg/cell (ref 26–32)
MCHC: 32 g/dL (ref 32–36)
MCV: 95 fL (ref 79–95)
Mono # K/uL: 0.5 10*3/uL (ref 0.2–0.9)
Monocyte %: 4.9 %
Neut # K/uL: 7.1 10*3/uL — ABNORMAL HIGH (ref 1.6–6.1)
Nucl RBC # K/uL: 0 10*3/uL (ref 0.0–0.0)
Nucl RBC %: 0 /100 WBC (ref 0.0–0.2)
Platelets: 206 10*3/uL (ref 160–370)
RBC: 4.3 MIL/uL (ref 3.9–5.2)
RDW: 12.5 % (ref 11.7–14.4)
Seg Neut %: 70.2 %
WBC: 10.2 10*3/uL — ABNORMAL HIGH (ref 4.0–10.0)

## 2019-06-04 LAB — BASIC METABOLIC PANEL
Anion Gap: 13 (ref 7–16)
CO2: 18 mmol/L — ABNORMAL LOW (ref 20–28)
Calcium: 8.6 mg/dL — ABNORMAL LOW (ref 8.8–10.2)
Chloride: 104 mmol/L (ref 96–108)
Creatinine: 0.57 mg/dL (ref 0.51–0.95)
GFR,Black: 146 *
GFR,Caucasian: 127 *
Glucose: 89 mg/dL (ref 60–99)
Lab: 8 mg/dL (ref 6–20)
Potassium: 4.1 mmol/L (ref 3.4–4.7)
Sodium: 135 mmol/L (ref 133–145)

## 2019-06-04 LAB — PREGNANCY TEST, SERUM: Preg,Serum: NEGATIVE

## 2019-06-04 LAB — EKG 12-LEAD
P: 23 deg
PR: 132 ms
QRS: 81 deg
QRSD: 98 ms
QT: 381 ms
QTc: 437 ms
Rate: 79 {beats}/min
T: 95 deg

## 2019-06-04 LAB — D-DIMER, QUANTITATIVE: D-Dimer: 0.6 ug/mL FEU — ABNORMAL HIGH (ref 0.00–0.50)

## 2019-06-04 MED ORDER — ACETAMINOPHEN 500 MG PO TABS *I*
1000.0000 mg | ORAL_TABLET | Freq: Once | ORAL | Status: AC
Start: 2019-06-04 — End: 2019-06-04
  Administered 2019-06-04: 1000 mg via ORAL
  Filled 2019-06-04: qty 2

## 2019-06-04 MED ORDER — IOHEXOL 350 MG/ML (OMNIPAQUE) IV SOLN 500ML BOTTLE *I*
1.0000 mL | Freq: Once | INTRAVENOUS | Status: AC
Start: 2019-06-04 — End: 2019-06-04
  Administered 2019-06-04: 70 mL via INTRAVENOUS

## 2019-06-04 NOTE — ED Provider Notes (Addendum)
History     Chief Complaint   Patient presents with    Chest Pain     28 year old female with a history of borderline personality disorder, fibromyalgia, hypothyroidism, migraine headaches, and SVT who presents to the emergency department with chest pain and leg pain.  The patient states that last evening around 9 PM she developed palpitations and was concerned that she was in SVT.  She began performing vagal maneuvers with little change in her symptoms.  Shortly after the onset of her symptoms she developed epigastric/left-sided chest pain that she describes as sharp and pleuritic.  She also experienced pain in her right thigh.  She denies any history of VTE.  She states that she was able to fall asleep by 3 AM, but when she awoke around 9AM the left-sided chest pain and leg pain were still present.  At the time of my initial assessment she also endorses mild dyspnea.          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    Depression     Endometriosis 2011    Fibromyalgia     Hypothyroid     12/14 denies     Migraines     Seizures     last 03/2017 in context abrupt withdrawl from medication    SVT (supraventricular tachycardia) 2013        Patient Active Problem List   Diagnosis Code    Calculi, ureter N20.1    GERD (gastroesophageal reflux disease) K21.9    Hypothyroid E03.9    Bronchitis J40    Pneumonia J18.9    Anxiety F41.9    Depression F32.9    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    Renal calculi N20.0    SVT (supraventricular tachycardia) I47.1    Fibromyalgia M79.7    Positive ANA (antinuclear antibody) R76.8    Major depressive disorder, recurrent, moderate F33.1    PTSD (post-traumatic stress disorder) F43.10            Past Surgical History:   Procedure Laterality Date    APPENDECTOMY      BUNIONECTOMY      COLONOSCOPY      lithotripsy        Family History   Problem Relation Age of Onset    Anxiety disorder Mother     Arthritis Mother     Cancer Mother     Depression Mother     Emphysema Mother     Arthritis Father           Social History     Tobacco Use    Smoking status: Current Every Day Smoker     Packs/day: 0.50     Years: 5.00     Pack years: 2.50     Types: Vaping, Cigarettes    Smokeless tobacco: Never Used   Substance Use Topics    Alcohol use: Not Currently     Alcohol/week: 2.0 standard drinks     Types: 2 Glasses of wine per week     Comment: denies recent use    Drug use: Not Currently     Types: Marijuana     Comment: medical marijuana pt     Living Situation     Questions Responses    Patient lives with  Significant Other    Comment: boyfriend     Homeless Yes    Comment: "we're kinda homeless" (moving around from place to place.     Caregiver for other family member No    External Services Mental Health Services    Comment: [facility] mental health ("dismissed" from [place] [facility] last month)     Employment Unemployed    Comment: [employer]      Domestic Violence Risk No                Review of Systems   Review of Systems   Constitutional: Negative for fever.   HENT: Negative for congestion and sore throat.    Eyes: Negative for visual disturbance.   Respiratory: Positive for shortness of breath. Negative for cough.    Cardiovascular: Positive for chest pain and palpitations.   Gastrointestinal: Negative for abdominal pain, diarrhea, nausea and vomiting.   Genitourinary: Negative for dysuria.   Musculoskeletal: Negative for myalgias.   Skin: Negative for rash.   Neurological: Negative for headaches.   Psychiatric/Behavioral: Negative for behavioral problems.       Physical Exam     Triage Vitals  Triage Start: Start, (06/04/19 1714)   First Recorded BP: 118/58, Resp: 20, Temp: 35.6 C (96.1 F), Temp src: TEMPORAL Oxygen Therapy SpO2: 97 %, Oximetry Source: Rt Hand, O2 Device: None (Room air), Heart Rate: 80,  (06/04/19 1728) Heart Rate (via Pulse Ox): 80, (06/04/19 1728).      Physical Exam  Vitals signs and nursing note reviewed.   Constitutional:       General: She is not in acute distress.     Appearance: She is well-developed. She is obese.   HENT:      Head: Normocephalic and atraumatic.      Mouth/Throat:      Mouth: Mucous membranes are moist.   Eyes:      Extraocular Movements: Extraocular movements intact.      Pupils: Pupils are equal, round, and reactive to light.   Neck:      Musculoskeletal: Normal range of motion and neck supple.      Vascular: No JVD.   Cardiovascular:      Rate and Rhythm: Normal rate and regular rhythm.      Heart sounds: Normal heart sounds.   Pulmonary:      Effort: No respiratory distress.      Breath sounds: Normal breath sounds. No wheezing or rales.   Abdominal:      General: There is no distension.      Palpations: Abdomen is soft.      Tenderness: There is no abdominal tenderness. There is no guarding or rebound.   Musculoskeletal: Normal range of motion.      Right lower leg: No edema.      Left lower leg: No edema.      Comments: Right proximal thigh tenderness without focal swelling, erythema, or warmth. Strong distal pulses.   Skin:     General: Skin is warm and dry.      Capillary Refill: Capillary refill takes less than 2 seconds.      Findings: No rash.   Neurological:      General: No focal deficit present.      Mental Status: She is alert and oriented to person, place, and time.         Medical Decision Making   Patient seen by me on:  06/04/2019    Assessment:  28 year old female  with a history of borderline personality disorder, fibromyalgia, hypothyroidism, migraine headaches, and SVT who presents to the emergency department chest pain and leg pain.  Vital signs are within normal limits.  Physical exam reveals an obese female with tenderness palpation of the right proximal thigh but otherwise no focal abnormalities.    Differential diagnosis:  GERD, musculoskeletal  pain, DVT, PE, low suspicion for infectious etiology such as pneumonia or viral URI given lack of infectious symptoms, no current evidence of SVT    Plan:  Orders Placed This Encounter      *Chest standard frontal and lateral views      Portable US doppler vein RIGHT lower extremity      CT angio chest      CBC and differential      Basic metabolic panel      D-dimer, quantitative      Pregnancy Test, Serum      EKG 12 lead (initial)      Insert peripheral IV      acetaminophen (TYLENOL) tablet 1,000 mg      iohexol (OMNIPAQUE) injection (526mL multi-use bottle) 1-150 mL      EKG Interpretation: normal sinus rhythm, no ischemic changes    Independent review of: Existing labs, CT scans, chart/prior records    ED Course and Disposition:  The patient's work-up is overall reassuring.  She has a trace leukocytosis to 10.2, no anemia, no significant electrolyte abnormalities, and normal renal function.  Right lower extremity ultrasound is negative for DVT.  However, her D-dimer was elevated to 0.60.  CT angio of the chest is negative for pulmonary embolism.  Given her reassuring work-up I am highly suspicious for GERD/gastritis.  Her lower extremity pain is likely musculoskeletal.  I recommend she continue Tylenol/NSAIDs for symptomatic relief as well as a trial of Tums/Pepcid for her GERD symptoms.  She will follow-up with her primary care physician to discuss this further.            Mathews Argyle, MD      Resident Attestation:    Patient seen by me on 06/04/2019.    I saw and evaluated the patient. I agree with the resident's/fellow's findings and plan of care as documented above.  Author:  Levonne Hubert, MD       Mathews Argyle, MD  Resident  06/04/19 5974       Levonne Hubert, MD  06/04/19 2255

## 2019-06-04 NOTE — ED Triage Notes (Signed)
Pt coming in via [facility] ambulance for c/p that started last night at 2100. Pt states pain is on left side pain worsens with movement and deep breath. Pt stated it was similar to SVT in the past and was trying different maneuvers to lower heart rate and stated she Passed out at 0300 and woke up today at 1300 Pt stated she fell out of bed and injured her right leg.         Triage Note   Benn Moulder, RN

## 2019-06-04 NOTE — Discharge Instructions (Signed)
You were seen in the emergency department for chest pain and leg pain.  As we discussed there is no evidence of blood clot in your lungs or leg.  You are safe to return home at this time.  Please follow-up with your primary care physician and return to the emergency department if you develop any new concerning symptoms.

## 2019-07-06 ENCOUNTER — Emergency Department
Admission: EM | Admit: 2019-07-06 | Discharge: 2019-07-07 | Disposition: A | Discharge status: Home / Self Care | Payer: Medicaid Other | Source: Ambulatory Visit | Attending: Psychiatry | Admitting: Psychiatry

## 2019-07-06 DIAGNOSIS — F329 Major depressive disorder, single episode, unspecified: Secondary | ICD-10-CM | POA: Insufficient documentation

## 2019-07-06 DIAGNOSIS — F1410 Cocaine abuse, uncomplicated: Secondary | ICD-10-CM

## 2019-07-06 DIAGNOSIS — F603 Borderline personality disorder: Secondary | ICD-10-CM

## 2019-07-06 DIAGNOSIS — R45851 Suicidal ideations: Secondary | ICD-10-CM | POA: Insufficient documentation

## 2019-07-06 NOTE — CPEP Notes (Signed)
CPEP Charge Nurse Note    Report received from: [place] RN, ambulatory, voluntarily accompanied by patient alone.    Vol for SI. No medical complaints. Denies drugs/etoh, cooperative, VSS.     Chief Complaint   Patient presents with    Suicidal       Allergies as of 07/06/2019 - Up to Date 07/06/2019   Allergen Reaction Noted    Cipro xr Nausea And Vomiting 02/16/2010       Past Medical History:   Diagnosis Date    Anxiety     Asthma     Borderline personality disorder     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    Depression     Endometriosis 2011    Fibromyalgia     Hypothyroid     12/14 denies     Migraines     Seizures     last 03/2017 in context abrupt withdrawl from medication    SVT (supraventricular tachycardia) 2013     Substance use: denies    Ingestion: Denies    Medical clearance: NA    Vital signs:  Last Filed Vitals    07/06/19 2335   BP: 133/58   Pulse: 93   Resp: 18   Temp: 36.1 C (97 F)   SpO2: 99%     Last Nursing documented pain:  0-10 Scale: 0 (07/06/19 2335)    Pre-Arrival Notifications: No    Patient location: ems     Chari Manning, RN, 11:43 PM

## 2019-07-06 NOTE — ED Triage Notes (Signed)
Pt states she is feeling suicidal. Denies any medical complaints. Denies drugs or ETOH.     Triage Note   Silva Bandy, RN

## 2019-07-07 NOTE — CPEP Notes (Signed)
Clinical Evaluator Collateral Note    Professional:              Name: "Kathleene Hazel", Role: [facility], Phone: Pt unable to provide at this time. Pt cannot remember her care worker's last name          Personal:   Name: Lewisfrench,Linda, Relationship : Mother, Phone: [phone]      Spoke with mother who stated she last saw her daughter yesterday.  "She is not well.  She had a bad break up.  It was also been a rough road with her boyfriend.  She has a history of a lot of issues; depression, PTSD, BiPolar, suicidal ideation."  Reported SI, "For since she was a teenager.  She has threatened to take all of her medication, just the other day.  She cuts herself.  She hasn't done that since July."  Reports no access to firearms.  "She is homeless at the moment.  She just got a hotel room until Monday.  She needs to call DSS to see if she can stay there or be housed somewhere else.  She can't stay here because of where I live in my complex I can only have someone stay with me for a few days at a time."      Corky Mull, Therapist, sports, 5:51 AM

## 2019-07-07 NOTE — ED Notes (Signed)
07/07/19 0600   Disposition details   Patient is being discharged to community   Family notification done via phone   Name of family member notified Lewisfrench,Linda, Relationship : Mother, Phone: [phone]   Outpatient provider notification of disposition not done-unable to reach   Living situation: notification of disposition NA-homeless   Transportation arranged via cab (voucher)   Patient belongings  given to patient   Medications NA- patient has none   Valuables NA- patient has none

## 2019-07-07 NOTE — CPEP Notes (Signed)
CPEP Triage Note    Arrival    Patient is oriented to unit and CPEP evaluation process: Yes  Reviewed cell phone and visitor policies: yes   Reviewed contraband items/milieu safety concerns and confirmed no safety concerns present: yes    Patient is accompanied by: patient alone  Patient under MHA: No    History and Chief Complaint    Reason for current presentation: Pt is a 28 y/o F w/ pphx borderline personality, depression, PTSD, ADHD, substance abuse who presents voluntarily in the context of SI w/ plan to "OD, slit her wrists, or hang self". Pt reports she was recently broken up w/ by her BF on Thursday evening and is now homeless. She reports that this has been a significant psychosocial stressor for her that has increased her suicidal thinking. She reports that she has also been struggling with thoughts of wanting to use, reports that she is currently 8 days sober from previous crack cocaine use. Pt reports having not slept or having taken her medications for the last 4 days. Pt was just recently connected through [facility]. Denies drug/ETOH use. Denies guns in the home or Covid-19 concerns. Pt recently seen for same and D/C from [place] on 07/04/19. Reports going to complete inpatient CD at [facility] "whenever a bed opens up". Currently endorses SI but denies SIB/SA/HI/VI/AVH and pain.    Current Mental Health Provider(s): [facility]    Any recent exposure to infestations(lice, scabies, bedbugs, fleas) or other communicable diseases: No    Substance use: denies     Ingestion: No    Self-harm: no    Medication Data Collection    Medication data collection: Yes: With Patient (Reliable) and With Pharmacy    Physical Assessment    Pain assessment: Last Nursing documented pain:  0-10 Scale: 0 (07/06/19 2335)    Last Filed Vitals    07/06/19 2335   BP: 133/58   Pulse: 93   Resp: 18   Temp: 36.1 C (97 F)   SpO2: 99%       Medical / Surgical History    PMH:   Past Medical History:   Diagnosis Date     Anxiety     Asthma     Borderline personality disorder     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    Depression     Endometriosis 2011    Fibromyalgia     Hypothyroid     12/14 denies     Migraines     Seizures     last 03/2017 in context abrupt withdrawl from medication    SVT (supraventricular tachycardia) 2013       PSH:   Past Surgical History:   Procedure Laterality Date    APPENDECTOMY      BUNIONECTOMY      COLONOSCOPY      lithotripsy         Review of Systems   Respiratory: Negative.    Cardiovascular: Negative.    Gastrointestinal: Negative.    Allergic/Immunologic: Negative.    Neurological: Negative.    Psychiatric/Behavioral: Positive for suicidal ideas. Negative for agitation, behavioral problems, confusion, decreased concentration, dysphoric mood, hallucinations, self-injury and sleep disturbance. The patient is nervous/anxious. The patient is not hyperactive.        Any additional concerns: no    Anticipated track: 2D    Sharen Hint, RN, 12:08 AM

## 2019-07-07 NOTE — First Provider Contact (Signed)
CPEP Provider Initial Contact     CPEP initial provider evaluation performed by   CPEP Provider Initial Contact     Date/Time Event User Comments    07/07/19 0355 CPEP Provider Initial Contact Casey Hughes, Casey Hughes           Chief Complaint:     Chief Complaint   Patient presents with    Suicidal     Patient is here voluntarily, with the above chief complaint.    Currently she is sleeping.    Vital Signs   Reviewed:  Last Filed Vitals    07/07/19 0021   BP: 123/67   Pulse: 98   Resp: 18   Temp: 36.2 C (97.2 F)   SpO2: 96%       Initial CPEP Plan:   collaterals, DBT worksheets, medications and monitor and document behavior.  Patient requires further evaluation.    No other immediate concerns.      Casey Hughes Casey Lesches, MD, 07/07/2019, 3:55 AM     Casey Bogus, MD  07/07/19 [phone]
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
## 2019-07-07 NOTE — CPEP Notes (Signed)
CPEP Clinical Evaluator Note    Patient seen by Eston Esters, RN on 07/07/2019 at 0515 AM..    Chief Complaint:     Chief Complaint   Patient presents with    Suicidal   .  The patient is here voluntarily, alone with the above chief complaint.    History of Present Illness:     History of Present Illness:   Patient seen by Eston Esters, RN on 07/07/2019 at 0515 AM.    Casey Hughes is a 28 y.o., Single (1), Caucasian female w/ pphx borderline personality, depression, PTSD, ADHD, substance abuse who presents to CPEP voluntarily accompanied by no one with complaint/report of SI w/ plan to "OD, slit her wrists, or hang self". Pt reports she was recently broken up w/ by her BF on Thursday evening and is now homeless. She reports that this has been a significant psychosocial stressor for her that has increased her suicidal thinking. She reports that she has also been struggling with thoughts of wanting to use, reports that she is currently 8 days sober from previous crack cocaine use. Pt reports having not slept or having taken her medications for the last 4 days. Pt was just recently connected through [facility]. Denies drug/ETOH use. Denies guns in the home or Covid-19 concerns. Pt recently seen for same and D/C from [facility] CPEP on 07/04/19. Reports going to complete inpatient CD at [facility] "whenever a bed opens up".    Pt currently resides at a local hotel after having been reportedly "kicked out" of her mother's home by her sister for concerns of drug use. Pt reports that she currently receives unemployment as she was relying on her BF financially. She reports that she has said hotel room in [place] until Monday morning when DSS will help set her up at that time with her own apartment. Pt reports that she is currently "grieving from a broken heart" and worries about living alone as she has never had to do so. Socially, pt reports minimal social supports but does endorse that her and her mother  have a strong and positive relationship. Pt hopes to become able to live independently following the split from her significant other.    Pt is currently connected outpatient with [facility] for CD and was reportedly "in talks" with her current providers to help ensure her both a therapist and psychiatrist whom she could speak to regularly. Pt reports that she is currently on Duloxetine, Abilify, Prazosin, Trazodone, Klonopin, and Adderall all of which are prescribed to her via her PCP. She reports that she has not taken her medications since the breakup on Thursday, 07/04/19, as she "was not caring for herself". She reports that she is usually medication compliant. The pt denies hx inpatient psychiatric admissions. The pt endorses hx previous CPEP presentations with the last being in June of 2020 for MDD. Pt also recently seen and D/C from [facility] CPEP on 07/04/2019 for presentation similar to current. Pt reports hx SIB w/ last cut reported as "awhile ago". Pt endorses hx of SA, last reported as her "last CPEP visit in June". Pt reports to Clinical research associate that she is currently slated to engage in inpatient CD through [facility] pending bed availability.    Pt reports recent decline in both sleep and appetite d/t recent breakup. She reports low mood and hopes that her BF "can get back on his medications as he is a schizophrenic". She denies both violence and legal hx. Pt endorses  depressive-like symptoms but denies that of hypo/hypermanic and OCD-like symptoms. Pt denies current use of drugs/ETOH/caffeine/OTC medications/herbal supplementations. Pt does endorse smoking both cigarettes (0.5 pks/day) and vaping. Pt currently denies both SI and intent to act on these thoughts. Pt also denying SIB/SA/HI/VI/AVH at this time. Pt able to readily engage in both [place] and [place]. MD Fidan made aware.        Collateral Contacts:     Personal:    Name: Lewisfrench,Linda, Relationship : Mother, Phone: [phone]       *Please see separate collateral note for further information.    Psychiatric History:   Current Treatment Providers  Psychiatrist: No  Therapist: No  Case manager: No  Other treatment providers: Provider #1  Name: "[facility]"  Role: [facility] Care Worker  Phone: Pt unable to provide at this time. Pt cannot remember her care worker's last name  Psychiatric History  Previous Diagnoses: Depressive disorder, Borderline PD, Attention deficit-Hyperactivity disorder (ADHD), Substance use disorder, Other (comment)(PTSD)  History of suicide attempts: Yes  Suicide attempt-most recent: 01/06/19  History of Non-Suicidal Self Injury: Yes  Most Recent Date: "Awhile ago"  Means of Harm: Cutting  History of violence: None  Psychiatric hospitalizations: None  CPEP/Psych ED visits: Yes  Frequency/most recent: 01/06/19 to [facility]. 07/04/19 to [facility]  History of abuse or trauma: None  Legal history: None  Is the patient currently in the [place] military or has been on active duty in the past?: No  Family psychiatric history: Unknown    Interventions:   Psychosocial Assessment, Psych Eval, Psych History, Family Collateral, Safety Plan, DIRA, Additctive Behavior Screen, Personal/Professional Contact Updates, CPEP Summary and CPEP Dispo       Safety Plan:   Safety Plan Intervention  Discussed Safety Plan with Patient: Yes  Completed Safety Plan with Patient: Yes  Completed Safety Plan with Patient: Yes (Comment): Yes  Safety Plan Discussed with Family/Friend/Support: Yes  Safety Plan Discussed: Yes (Comment): Yes       Safety Plan    Step 1:  Warning signs (thoughts, images, mood, situation, behaviors) that a crisis may be developing:   1. Feeling lonely  2. Housing instability  3. Wanting to use      Step 2:  Internal Coping Strategies - Things I can do to take my mind off my problems without contacting another person   1. Reading   2. Coloring  3. Engaging in spiritual practices      Step 3:  People and Social Settings that provide  distraction   1. Mother  2. [place] providers  3. Doing others' hair and makeup      Step 4:  People whom I can ask for help   1. Mother  2. Sister  3. [place] providers      Step 5:  Professional or agencies I can contact during a crisis   1. Clinician Name: [facility]               Phone #: [phone]  2. Local Urgent Care/Emergency Department: [facility]   3. Suicide Prevention Hotline Phone: 1-800-273-TALK (8255)  4. Mobile Crisis Team: [phone], [phone]  5. 911      Step 6:  Making the environment safe   1. Remove all sharps/weapons from the home  2. Don't use drugs/alcohol  3. Lock up all medications      Patient presentation, information, collateral and disposition options were reviewed and discussed with MD Fidan.  Sharen Hint, RN

## 2019-07-07 NOTE — Discharge Instructions (Signed)
CPEP Discharge Instructions    Discharge Date: 07/07/2019    Discharge Time:   6:30 AM    Follow-ups: Please follow up with your [facility] providers at your regularly scheduled appointments. Please also follow up with [facility] in regards to your upcoming inpatient chemical dependency bed/admission.    Adult Walk-in Mental Health Services    Instructions:  Please attend ONE of the walk-in services listed below and inform them you were discharged from CPEP and need to begin the intake process.    **Please plan your time accordingly. Average intake time is 2 hours to complete.  **Please bring your insurance information to the intake appointment.    [facility]   [address] [place], [place] [address]   Phone: [phone]   WALK IN HOURS: Tuesdays, Wednesdays, and Thursdays from 9am-11am    [facility])  [address]  [place], [place] [address]  Phone: [phone]  WALK IN HOURS: Monday / Friday, 9am-11am and Tuesday / Wednesday, 12pm-2pm     [facility])  [address]  [place] [address]  Phone:  [phone]  WALK IN HOURS: Tuesdays and Thursdays - Registration: 8:00am-10:00am                                                                             Seen: 9am-12:00pm    [facility])  [address]  [place], [place] [address]  Phone: [phone]  WALK IN HOURS: Monday 1pm-2:30pm / Tuesday, Thursday, Friday 9am-10:30am    [facility])  [address] [place], [place] [address]  Phone: [phone]  WALK IN HOURS: Mondays and Wednesdays - 8:30am-9:30am              [facility]   [address]   [place], [place] [address]   Phone: [phone]   WALK IN HOURS: Mon./Wed./Thurs: 8:30am-11:00am and Tuesday: 10:30am-1:00 pm    [facility]  [address]  [place], [place]  [address]  Phone: [phone]  WALK IN HOURS: Thursday mornings 8:00am-9:30am    [facility]  [address] [place], [place] [address]   Phone: [phone]  WALK IN HOURS: Monday - Friday - 9am-2pm    Adult PROS Walk-in Services    [facility])  [address]  [place], [place] [address]  Phone: [phone]  WALK IN HOURS: Monday and Wednesday, 12pm - 2pm    [facility] [place] [facility]  [address]  [place] [address]  Phone:  [phone]  WALK IN HOURS: Tuesday, Thursday, and Friday, 9am - 12pm    The clinical team recommends the following actions for creating a safe environment post discharge utilize options as identified in completed safety plan , reach out to family and/or friends for support , remove any weapons or firearms from home, remove access to sharp objects, remove illicit substances and/or alcohol from home , take medications as prescribed  and remove old medications no longer prescribed     Level of outreach indicated if patient fails new intake or COPS (Comprehensive Outpatient Psychiatric Service) appointment: Routine Program Follow-up    When to call for help:    Call your psychiatric outpatient provider if experiencing any of these symptoms: increased irritability, sleep changes, appetite changes, energy changes, thoughts to harm yourself or others, anxiety, fear, auditory or visual hallucinations.  [facility] Helpline (24 hours/7 days) [phone] Product/process development scientist)  Mobile Crisis team: [phone]    General Instructions:  Other written information given to the patient: No  Return to Work/School on: 07/07/19    The above information has been discussed with me and I have received a copy.  I understand that I am advised to follow the instructions given to me to appropriately care for my condition.    Safety Plan      Step 1:  Warning signs (thoughts, images, mood, situation, behaviors) that a crisis may be  developing:   1. Feeling lonely  2. Housing instability  3. Wanting to use      Step 2:  Internal Coping Strategies - Things I can do to take my mind off my problems without contacting another person   1. Reading   2. Coloring  3. Engaging in spiritual practices      Step 3:  People and Social Settings that provide distraction   1. Mother  2. [place] providers  3. Doing others' hair and makeup      Step 4:  People whom I can ask for help   1. Mother  2. Sister  3. [place] providers      Step 5:  Professional or agencies I can contact during a crisis   1. Clinician Name: [facility]   Phone #: [phone]  2. Local Urgent Care/Emergency Department: [facility]   3. Suicide Prevention Hotline Phone: 1-800-273-TALK (8255)  4. Mobile Crisis Team: [phone], [phone]  5. 911      Step 6:  Making the environment safe   1. Remove all sharps/weapons from the home  2. Don't use drugs/alcohol  3. Lock up all medications

## 2019-07-07 NOTE — CPEP Notes (Signed)
Aaron Edelman, RN made pt's mother aware of pt's disposition during collateral call made earlier (see note). Mother appreciative of update.

## 2019-11-07 DIAGNOSIS — F1411 Cocaine abuse, in remission: Secondary | ICD-10-CM | POA: Insufficient documentation

## 2019-11-24 DIAGNOSIS — R079 Chest pain, unspecified: Secondary | ICD-10-CM | POA: Insufficient documentation

## 2019-12-31 ENCOUNTER — Encounter: Payer: Self-pay | Admitting: Gastroenterology

## 2020-01-09 ENCOUNTER — Ambulatory Visit: Payer: Medicaid Other | Admitting: Rehabilitative and Restorative Service Providers"

## 2020-02-01 ENCOUNTER — Emergency Department
Admission: EM | Admit: 2020-02-01 | Discharge: 2020-02-02 | Disposition: A | Discharge status: Home / Self Care | Payer: Medicaid Other | Source: Ambulatory Visit | Attending: Family | Admitting: Family

## 2020-02-01 ENCOUNTER — Emergency Department: Payer: Medicaid Other

## 2020-02-01 DIAGNOSIS — Z975 Presence of (intrauterine) contraceptive device: Secondary | ICD-10-CM | POA: Insufficient documentation

## 2020-02-01 DIAGNOSIS — N939 Abnormal uterine and vaginal bleeding, unspecified: Secondary | ICD-10-CM

## 2020-02-01 DIAGNOSIS — Y998 Other external cause status: Secondary | ICD-10-CM | POA: Insufficient documentation

## 2020-02-01 DIAGNOSIS — Y9289 Other specified places as the place of occurrence of the external cause: Secondary | ICD-10-CM | POA: Insufficient documentation

## 2020-02-01 DIAGNOSIS — Z118 Encounter for screening for other infectious and parasitic diseases: Secondary | ICD-10-CM | POA: Insufficient documentation

## 2020-02-01 DIAGNOSIS — X58XXXA Exposure to other specified factors, initial encounter: Secondary | ICD-10-CM | POA: Insufficient documentation

## 2020-02-01 DIAGNOSIS — R102 Pelvic and perineal pain: Secondary | ICD-10-CM | POA: Insufficient documentation

## 2020-02-01 DIAGNOSIS — S3141XA Laceration without foreign body of vagina and vulva, initial encounter: Secondary | ICD-10-CM | POA: Insufficient documentation

## 2020-02-01 DIAGNOSIS — Z113 Encounter for screening for infections with a predominantly sexual mode of transmission: Secondary | ICD-10-CM | POA: Insufficient documentation

## 2020-02-01 DIAGNOSIS — B9689 Other specified bacterial agents as the cause of diseases classified elsewhere: Secondary | ICD-10-CM | POA: Insufficient documentation

## 2020-02-01 DIAGNOSIS — R451 Restlessness and agitation: Secondary | ICD-10-CM

## 2020-02-01 DIAGNOSIS — Y9389 Activity, other specified: Secondary | ICD-10-CM | POA: Insufficient documentation

## 2020-02-01 LAB — BASIC METABOLIC PANEL
Anion Gap: 11 (ref 7–16)
CO2: 24 mmol/L (ref 20–28)
Calcium: 9.2 mg/dL (ref 8.8–10.2)
Chloride: 104 mmol/L (ref 96–108)
Creatinine: 0.59 mg/dL (ref 0.51–0.95)
GFR,Black: 144 *
GFR,Caucasian: 125 *
Glucose: 81 mg/dL (ref 60–99)
Lab: 7 mg/dL (ref 6–20)
Potassium: 4 mmol/L (ref 3.4–4.7)
Sodium: 139 mmol/L (ref 133–145)

## 2020-02-01 LAB — URINE MICROSCOPIC (IQ200): WBC,UA: >50 /hpf — AB (ref 0–5)

## 2020-02-01 LAB — CBC AND DIFFERENTIAL
Baso # K/uL: 0.1 10*3/uL (ref 0.0–0.1)
Basophil %: 0.4 %
Eos # K/uL: 0.2 10*3/uL (ref 0.0–0.4)
Eosinophil %: 1.3 %
Hematocrit: 42 % (ref 34–45)
Hemoglobin: 13.7 g/dL (ref 11.2–15.7)
IMM Granulocytes #: 0.1 10*3/uL — ABNORMAL HIGH (ref 0.0–0.0)
IMM Granulocytes: 0.5 %
Lymph # K/uL: 2.7 10*3/uL (ref 1.2–3.7)
Lymphocyte %: 18.6 %
MCH: 31 pg (ref 26–32)
MCHC: 33 g/dL (ref 32–36)
MCV: 95 fL (ref 79–95)
Mono # K/uL: 0.7 10*3/uL (ref 0.2–0.9)
Monocyte %: 4.8 %
Neut # K/uL: 10.6 10*3/uL — ABNORMAL HIGH (ref 1.6–6.1)
Nucl RBC # K/uL: 0 10*3/uL (ref 0.0–0.0)
Nucl RBC %: 0 /100 WBC (ref 0.0–0.2)
Platelets: 289 10*3/uL (ref 160–370)
RBC: 4.5 MIL/uL (ref 3.9–5.2)
RDW: 13.2 % (ref 11.7–14.4)
Seg Neut %: 74.4 %
WBC: 14.3 10*3/uL — ABNORMAL HIGH (ref 4.0–10.0)

## 2020-02-01 LAB — URINALYSIS REFLEX TO CULTURE
Glucose,UA: NEGATIVE mg/dL
Ketones, UA: NEGATIVE mg/dL
Nitrite,UA: NEGATIVE
Specific Gravity,UA: 1.027 (ref 1.002–1.030)
pH,UA: 6.5 (ref 5.0–8.0)

## 2020-02-01 LAB — PREGNANCY, URINE: Preg Test,UR: NEGATIVE

## 2020-02-01 MED ORDER — CEPHALEXIN 250 MG PO CAPS *I*
500.0000 mg | ORAL_CAPSULE | Freq: Once | ORAL | Status: AC
Start: 2020-02-02 — End: 2020-02-02
  Administered 2020-02-02: 500 mg via ORAL
  Filled 2020-02-01: qty 2

## 2020-02-01 MED ORDER — OXYCODONE HCL 5 MG PO TABS *I*
5.0000 mg | ORAL_TABLET | Freq: Once | ORAL | Status: AC
Start: 2020-02-01 — End: 2020-02-01
  Administered 2020-02-01: 5 mg via ORAL
  Filled 2020-02-01: qty 1

## 2020-02-01 MED ORDER — ACETAMINOPHEN 500 MG PO TABS *I*
1000.0000 mg | ORAL_TABLET | Freq: Once | ORAL | Status: AC
Start: 2020-02-01 — End: 2020-02-01
  Administered 2020-02-01: 1000 mg via ORAL
  Filled 2020-02-01: qty 2

## 2020-02-01 MED ORDER — KETOROLAC TROMETHAMINE 30 MG/ML IJ SOLN *I*
30.0000 mg | Freq: Once | INTRAMUSCULAR | Status: AC
Start: 2020-02-02 — End: 2020-02-01
  Administered 2020-02-01: 30 mg via INTRAMUSCULAR
  Filled 2020-02-01: qty 1

## 2020-02-01 NOTE — Discharge Instructions (Addendum)
Your UA shows a UTI. Take the antibiotic Keflex twice daily for 7 days. Take Tylenol and Motrin at home as needed for pain. Follow up with your PCP. Call or return with any concerns.

## 2020-02-01 NOTE — ED Triage Notes (Signed)
Pt reports that while having sexual  intercourse with significant other she started to have abdominal cramping with vaginal bleeding   Pt reports that her period is not due and that she has an IUD in pace      Triage Note   Vivia Birmingham, RN

## 2020-02-02 MED ORDER — CEPHALEXIN 500 MG PO CAPS *I*
500.0000 mg | ORAL_CAPSULE | Freq: Two times a day (BID) | ORAL | 0 refills | Status: AC
Start: 2020-02-02 — End: 2020-02-09

## 2020-02-03 LAB — VAGINITIS SCREEN: DNA PROBE: Vaginitis Screen:DNA Probe: POSITIVE — AB

## 2020-02-03 LAB — AEROBIC CULTURE: Aerobic Culture: 0

## 2020-02-03 LAB — CHLAMYDIA PLASMID DNA AMPLIFICATION: Chlamydia Plasmid DNA Amplification: 0

## 2020-02-03 LAB — N. GONORRHOEAE DNA AMPLIFICATION: N. gonorrhoeae DNA Amplification: 0

## 2020-02-07 ENCOUNTER — Other Ambulatory Visit: Payer: Self-pay | Admitting: Physician Assistant

## 2020-02-07 MED ORDER — METRONIDAZOLE 500 MG PO TABS *I*
500.0000 mg | ORAL_TABLET | Freq: Two times a day (BID) | ORAL | 0 refills | Status: AC
Start: 2020-02-07 — End: 2020-02-14

## 2020-02-11 ENCOUNTER — Ambulatory Visit: Payer: Medicaid Other | Admitting: Obstetrics and Gynecology

## 2020-02-18 ENCOUNTER — Ambulatory Visit: Payer: Medicaid Other | Admitting: Obstetrics and Gynecology

## 2020-02-18 ENCOUNTER — Emergency Department: Payer: Medicaid Other

## 2020-02-18 ENCOUNTER — Encounter: Payer: Self-pay | Admitting: Gastroenterology

## 2020-02-18 ENCOUNTER — Emergency Department
Admission: EM | Admit: 2020-02-18 | Discharge: 2020-02-18 | Disposition: A | Discharge status: Home / Self Care | Payer: Medicaid Other | Source: Ambulatory Visit | Attending: Physician Assistant | Admitting: Physician Assistant

## 2020-02-18 DIAGNOSIS — Y9301 Activity, walking, marching and hiking: Secondary | ICD-10-CM | POA: Insufficient documentation

## 2020-02-18 DIAGNOSIS — Y9289 Other specified places as the place of occurrence of the external cause: Secondary | ICD-10-CM | POA: Insufficient documentation

## 2020-02-18 DIAGNOSIS — M25572 Pain in left ankle and joints of left foot: Secondary | ICD-10-CM

## 2020-02-18 DIAGNOSIS — F17210 Nicotine dependence, cigarettes, uncomplicated: Secondary | ICD-10-CM | POA: Insufficient documentation

## 2020-02-18 DIAGNOSIS — S93402A Sprain of unspecified ligament of left ankle, initial encounter: Secondary | ICD-10-CM | POA: Insufficient documentation

## 2020-02-18 DIAGNOSIS — M79662 Pain in left lower leg: Secondary | ICD-10-CM

## 2020-02-18 DIAGNOSIS — W108XXA Fall (on) (from) other stairs and steps, initial encounter: Secondary | ICD-10-CM

## 2020-02-18 DIAGNOSIS — M79672 Pain in left foot: Secondary | ICD-10-CM

## 2020-02-18 DIAGNOSIS — Y998 Other external cause status: Secondary | ICD-10-CM | POA: Insufficient documentation

## 2020-02-18 DIAGNOSIS — M7989 Other specified soft tissue disorders: Secondary | ICD-10-CM

## 2020-02-18 DIAGNOSIS — X501XXA Overexertion from prolonged static or awkward postures, initial encounter: Secondary | ICD-10-CM | POA: Insufficient documentation

## 2020-02-18 MED ORDER — IBUPROFEN 600 MG PO TABS *I*
600.0000 mg | ORAL_TABLET | Freq: Once | ORAL | Status: AC
Start: 2020-02-18 — End: 2020-02-18
  Administered 2020-02-18: 600 mg via ORAL
  Filled 2020-02-18: qty 1

## 2020-02-18 MED ORDER — ACETAMINOPHEN 500 MG PO TABS *I*
1000.0000 mg | ORAL_TABLET | Freq: Once | ORAL | Status: AC
Start: 2020-02-18 — End: 2020-02-18
  Administered 2020-02-18: 1000 mg via ORAL
  Filled 2020-02-18: qty 2

## 2020-02-18 NOTE — ED Triage Notes (Addendum)
Patient states tripped down 2 steps due to dark area last night and c/o left ankle pain worsening. Brought by EMS- splint placed PTA. Denies numbness/tingling. Denies head strike/neck pain or LOC. + swelling left foot/ankle       Triage Note   Nira Conn, RN

## 2020-02-18 NOTE — Progress Notes (Unsigned)
{GYN Practice Locations:[phone]}    Chief Complaint:     No chief complaint on file.    Subjective:     Casey Hughes is a 29 y.o. female No obstetric history on file. with LMP of   who presents for an annual exam.     She is currently using {Contraceptive [phone]} for contraception. {Menses Pattern:[phone]}     {Sexually Active:[phone]} STI screening is {not indicated/requested/declined:14582}.  Patient notes {Breast Issues:[phone]}. Other gyn related symptoms noted today: {symptoms:30830}.    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    Depression     Endometriosis 2011    Fibromyalgia     Hypothyroid     12/14 denies     Migraines     Seizures     last 03/2017 in context abrupt withdrawl from medication    SVT (supraventricular tachycardia) 2013     Social History     Tobacco Use    Smoking status: Current Every Day Smoker     Packs/day: 0.50     Years: 5.00     Pack years: 2.50     Types: Vaping, Cigarettes    Smokeless tobacco: Never Used   Substance Use Topics    Alcohol use: Not Currently     Alcohol/week: 2.0 standard drinks     Types: 2 Glasses of wine per week     Comment: denies recent use       Past Surgical History:   Procedure Laterality Date    APPENDECTOMY      BUNIONECTOMY      COLONOSCOPY      lithotripsy       Gynecologic History      Family History   Problem Relation Age of Onset    Anxiety disorder Mother     Arthritis Mother     Cancer Mother     Depression Mother     Emphysema Mother     Arthritis Father      OB History    No obstetric history on file.        Current Outpatient Medications   Medication Sig    meloxicam (MOBIC) 7.5 MG tablet Take 7.5 mg by mouth 2 times daily    HYDROmorphone (DILAUDID) 2 MG tablet Take 1 tablet (2 mg total) by mouth every 4  hours as needed for Pain Max daily dose: 12 mg    clonazePAM (KLONOPIN) 2 MG tablet Take 2 mg by mouth daily as needed     prazosin (MINIPRESS) 2 MG capsule Take 2 mg by mouth daily    omeprazole (PRILOSEC) 20 MG capsule Take 20 mg by mouth daily (before breakfast)    amphetamine-dextroamphetamine (ADDERALL) 30 MG tablet Take 30 mg by mouth every morning ER 30 mg daily    fluticasone (FLONASE) 50 MCG/ACT nasal spray 2 sprays by Nasal route daily 2 sprays into each nostril daily    albuterol HFA (PROVENTIL, VENTOLIN, PROAIR HFA) 108 (90 Base) MCG/ACT inhaler Inhale 1-2 puffs into the lungs every 6 hours as needed for Wheezing Shake well before each use.    hyoscyamine (ANASPAZ,LEVSIN) 0.125 MG tablet Take 0.125 mg by mouth every 8 hours as needed for Cramping  ARIPiprazole (ABILIFY) 5 MG tablet Take 5 mg by mouth daily    prazosin (MINIPRESS) 1 MG capsule Take 1 mg by mouth nightly    amphetamine-dextroamphetamine (ADDERALL) 20 MG tablet Take 20 mg by mouth every morning    acetaminophen (TYLENOL) 500 mg tablet Take 2 tablets (1,000 mg total) by mouth every 6 hours as needed for Pain    DULoxetine (CYMBALTA) 60 MG capsule Take 2 capsules (120 mg total) by mouth daily    traZODone (DESYREL) 50 MG tablet Take 1 tablet (50 mg total) by mouth nightly    cyclobenzaprine (FLEXERIL) 5 MG tablet Take 5 mg by mouth 3 times daily as needed for Muscle spasms    levonorgestrel, KYLEENA, (KYLEENA) 19.5 MG IUD 1 each by Intrauterine route once    Allergies   Allergen Reactions    Cipro Xr Nausea And Vomiting            Objective:   There were no vitals taken for this visit.  There were no vitals taken for this visit.    OB/GYN Physical Exam   {Wet Prep Results (Optional):[phone]::"Wet Prep: "}  Assessment:     Casey Hughes is a 29 y.o. female who presents for an annual exam.       Plan:      {AGY EPPI:[phone]}

## 2020-02-18 NOTE — Patient Instructions (Incomplete)
Healthy Practices for Women of all Ages     In addition to your regular OB/GYN history, physical, cholesterol and diabetes screenings, cervical cancer screen, and other recommended cancer screening tests, we ask that you review this list of healthy practices.  Modifying your daily activities according to these practices may improve your overall health and well-being.  In the event of questions about this list, ask your health care provider.  Additionally, there are specially written pamphlets available, which offer further information.    Diet and Exercise:  - Limit fat and cholesterol; emphasize fruits, grains and vegetables.  - Consume dairy products or use calcium supplementation for adequate calcium intake (1200 mg or more).  - Add folic acid supplementation 0.4 mg (400 micrograms, present in most daily multivitamins) at least two months before considering pregnancy to reduce the risks of birth defects.  - Participate in regular exercise for 30 minutes at least five times a week, and consider weight training.    Injury Prevention:  - Seat/lap belts should be worn while in a moving car.  - Helmet should be used when using motorcycles, bicycles, roller blades and ATVs or skiing.  - Place approved smoke detectors in your house and replace the batteries twice a year.    - Guns and other firearms should be stored unloaded and in a locked area.  Trigger locks should be used as well.  - Consider CPR training for household members.  - Do not Text and Drive.    Dental Health:  - Schedule regular visits to the dentist.  - Floss and brush with fluoride toothpaste daily.    Immunizations:  - A tetanus/diphtheria booster shot (d/T) is recommended every 10 years.  - An MMR vaccine is recommended for non-pregnant women born after [birth year] without proof of immunity of documentation of previous immunization.  Adults who are susceptible to varicella (chicken pox) should be vaccinated.  - Influenza vaccine is indicated yearly for  all patients.  - Pneumococcal pneumonia vaccine is indicated for age 41 and older once in your life.  - Hepatitis A and/or B vaccines are recommended for high-risk individuals.    Substance Abuse:  - Stop smoking; do not use any other tobacco products.  - Avoid alcohol use when driving, boating, swimming or operating other machinery. Avoid excessive use of alcoholic beverages.  - Recreational drug use (marijuana, cocaine, etc.) is dangerous and can be habit-forming.    Sexual Behavior:  - Be sure to use contraception if pregnancy is not desired.  - Regular use of female or female condoms with spermicide helps prevent STDs.  - Consider HIV testing if:  1. You have had more than one sexual partner.  2. You have had any STDs.  3. You have used intravenous drugs.  4. You have a sexual partner with these (the above) risk factors.  5. Your sexual partner has had female homosexual exposure.  6. You received a blood transfusion during 1978-1985.    Breast Health:  - A mammogram should be done once at age 27, then every 1-2 years based on risk factors.  If there is a strong family history of premenopausal breast cancer, you may need to start with mammograms earlier than age 1.    Colon Cancer Surveillance:  - Beginning at age 78, stool occult blood screening should be done annually and/or sigmoidoscopy (scope examination of the colon) every 3-5 years.    Women and Alcohol  For women, alcohol use carries some unique and  special concerns. Most studies on alcohol have been on men, but we now know women may respond differently to alcohol.   Concerns: Liver damage, cancer, menstrual cycle changes, birth defects, fetal alcohol syndrome, intoxication, nutrition and weight management.  All women should avoid all alcohol during pregnancy.  Help for Problem Drinking:   One in every three members of Alcoholics Anonymous (AA) is now a woman.  AA meetings are held regularly and have led the way in demonstrating the effectiveness of  self-help.   Outpatient and Inpatient Treatment are also available.  Ask your health care provider for a referral.   The first step in successful treatment is to recognize that there is a problem and accept help.    Dietary and Supplemental Calcium  It is important that you build and protect your bone mass through a program of regular exercise and proper nutrition with adequate amounts of calcium in your diet. Dairy products are considered to be the most important sources of calcium.

## 2020-02-18 NOTE — Discharge Instructions (Signed)
Aircast and crutches to help with ambulation. Ice and elevate. Tylenol and motrin for pain. Call orthopedics for followup

## 2020-02-19 NOTE — ED Provider Notes (Signed)
History     Chief Complaint   Patient presents with    Ankle Injury     29 year old female presents to the emergency department for a left ankle injury.  Reports last night she was walking in the dark when she twisted her left ankle.  She did not fall to the ground or strike her head.  She has had significant swelling and pain with weightbearing since.  No prior injury.      History provided by:  Patient      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    Depression     Endometriosis 2011    Fibromyalgia     Hypothyroid     12/14 denies     Migraines     Seizures     last 03/2017 in context abrupt withdrawl from medication    SVT (supraventricular tachycardia) 2013        Patient Active Problem List   Diagnosis Code    Calculi, ureter N20.1    GERD (gastroesophageal reflux disease) K21.9    Hypothyroid E03.9    Bronchitis J40    Pneumonia J18.9    Anxiety F41.9    Depression F32.9    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    Renal calculi N20.0    SVT (supraventricular tachycardia) I47.1    Fibromyalgia M79.7    Positive ANA (antinuclear antibody) R76.8    Major depressive disorder, recurrent, moderate F33.1    PTSD (post-traumatic stress disorder) F43.10            Past Surgical History:   Procedure Laterality Date    APPENDECTOMY      BUNIONECTOMY      COLONOSCOPY      lithotripsy       Family History   Problem Relation Age of Onset    Anxiety disorder Mother     Arthritis Mother     Cancer Mother     Depression Mother     Emphysema Mother     Arthritis Father           Social History     Tobacco Use    Smoking status: Current Every Day Smoker     Packs/day: 0.50     Years: 5.00     Pack years: 2.50     Types: Vaping, Cigarettes    Smokeless tobacco: Never Used   Substance Use Topics    Alcohol use: Not Currently     Alcohol/week: 2.0 standard  drinks     Types: 2 Glasses of wine per week     Comment: denies recent use    Drug use: Not Currently     Types: Marijuana, Cocaine     Comment: Reports being 8 days clean from crack cocaine 07/07/19     Living Situation     Questions Responses    Patient lives with Other(comment)    Comment: Reports being homeless     Homeless Yes    Caregiver for other family member No    External Services Mental Health Services    Comment: [employer]     Employment Unemployed    Comment: [employer]      Domestic Violence Risk No  Review of Systems   Review of Systems   Constitutional: Negative for chills and fever.   Respiratory: Negative for shortness of breath.    Cardiovascular: Negative for chest pain.   Musculoskeletal: Positive for arthralgias and joint swelling.   Skin: Negative.    All other systems reviewed and are negative.      Physical Exam     Triage Vitals  Triage Start: Start, (02/18/20 1428)   First Recorded BP: 110/53, Resp: 16, Temp: 35.9 C (96.6 F), Temp src: TEMPORAL Oxygen Therapy SpO2: 98 %, Oximetry Source: Rt Hand, O2 Device: None (Room air), Heart Rate: 103, (02/18/20 1406) Heart Rate (via Pulse Ox): 103, (02/18/20 1406).  First Pain Reported  0-10 Scale: 10, Pain Location/Orientation: Ankle Left, (02/18/20 1429)       Physical Exam  Vitals and nursing note reviewed.   Constitutional:       General: She is not in acute distress.     Appearance: Normal appearance. She is not ill-appearing.   HENT:      Head: Normocephalic and atraumatic.   Cardiovascular:      Rate and Rhythm: Normal rate and regular rhythm.      Heart sounds: Normal heart sounds.   Pulmonary:      Effort: Pulmonary effort is normal.      Breath sounds: Normal breath sounds.   Musculoskeletal:         General: Normal range of motion.      Comments: Swelling to left ankle, tender over lateral malleolus and mid-distal fibula, minimal tenderness of foot, neurovascularly intact   Skin:     General: Skin is warm and dry.       Capillary Refill: Capillary refill takes less than 2 seconds.   Neurological:      General: No focal deficit present.      Mental Status: She is alert and oriented to person, place, and time.   Psychiatric:         Mood and Affect: Mood normal.         Behavior: Behavior normal.         Thought Content: Thought content normal.         Judgment: Judgment normal.         Medical Decision Making   Patient seen by me on:  02/18/2020    Assessment:  29 year old female presenting to the emergency department for left ankle injury that occurred last night    Differential diagnosis:  Sprain, fracture, contusion    Plan:  Orders Placed This Encounter      Crutches      ED/UC Aircast Ankle Stirrups - Standard      * Ankle LEFT standard AP, lateral, mortise views      * Tibia fibula LEFT standard AP and Lateral views      * Foot LEFT standard AP, Lateral, Oblique views      Please Provide Supply: Crutches      Please Provide Supply: Ankle Aircast        ED Course and Disposition:  Patient's x-rays without acute finding.  Suspect ankle sprain.  She is provided an Aircast, crutches and advised orthopedic follow-up.  Stable for discharge home            [facility]          Sudie Grumbling [place], [place]  02/19/20 [phone]

## 2020-02-19 NOTE — ED Provider Notes (Signed)
History     Chief Complaint   Patient presents with    Vaginal Bleeding     HPI   29 year old female patient presents emergency department with complaint of vaginal bleeding. Patient states that she was having intercourse with a new partner this evening and she just started to experience lower abdominal cramping and pain, states that his penis was large in size. Patient states that she also has had vaginal bleeding since then and is not due to get her period for another 2 weeks. Patient has a Mirena IUD in place.    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    Depression     Endometriosis 2011    Fibromyalgia     Hypothyroid     12/14 denies     Migraines     Seizures     last 03/2017 in context abrupt withdrawl from medication    SVT (supraventricular tachycardia) 2013        Patient Active Problem List   Diagnosis Code    Calculi, ureter N20.1    GERD (gastroesophageal reflux disease) K21.9    Hypothyroid E03.9    Bronchitis J40    Pneumonia J18.9    Anxiety F41.9    Depression F32.9    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    Renal calculi N20.0    SVT (supraventricular tachycardia) I47.1    Fibromyalgia M79.7    Positive ANA (antinuclear antibody) R76.8    Major depressive disorder, recurrent, moderate F33.1    PTSD (post-traumatic stress disorder) F43.10            Past Surgical History:   Procedure Laterality Date    APPENDECTOMY      BUNIONECTOMY      COLONOSCOPY      lithotripsy       Family History   Problem Relation Age of Onset    Anxiety disorder Mother     Arthritis Mother     Cancer Mother     Depression Mother     Emphysema Mother     Arthritis Father           Social History     Tobacco Use    Smoking status: Current Every Day Smoker     Packs/day: 0.50     Years: 5.00     Pack years: 2.50     Types: Vaping, Cigarettes     Smokeless tobacco: Never Used   Substance Use Topics    Alcohol use: Not Currently     Alcohol/week: 2.0 standard drinks     Types: 2 Glasses of wine per week     Comment: denies recent use    Drug use: Not Currently     Types: Marijuana, Cocaine     Comment: 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   HENT: Negative for congestion and rhinorrhea.    Eyes: Negative for pain and redness.   Respiratory: Negative for cough and shortness of breath.    Cardiovascular: Negative for chest pain and palpitations.   Gastrointestinal: Positive for abdominal pain. Negative for nausea and vomiting.   Genitourinary: Positive for dysuria, pelvic pain, vaginal bleeding and vaginal pain. Negative for difficulty urinating.   Musculoskeletal: Negative for neck pain and neck stiffness.   Skin: Negative for color change.   Neurological: Negative for weakness, numbness and headaches.   Psychiatric/Behavioral: Positive for agitation. Negative for confusion. The patient is nervous/anxious.    All other systems reviewed and are negative.      Physical Exam     Triage Vitals  Triage Start: Start, (02/01/20 2050)   First Recorded BP: 133/79, Resp: 16, Temp: 36.9 C (98.4 F), Temp src: TEMPORAL Oxygen Therapy SpO2: 98 %, Oximetry Source: Lt Hand, O2 Device: None (Room air), Heart Rate: (!) 118, (02/01/20 2052) Heart Rate (via Pulse Ox): 97, (02/01/20 2245).  First Pain Reported  0-10 Scale: 10, Pain Location/Orientation: Pelvis, Pain Descriptors: Aching;Sharp (With intermittent sharp pains), (02/01/20 2052)        Physical Exam  Vitals and nursing note reviewed. Exam conducted with a chaperone present.   Constitutional:       General: She is not in acute distress.     Appearance: Normal appearance. She is obese. She is not ill-appearing, toxic-appearing or diaphoretic.   HENT:      Head: Normocephalic and atraumatic.      Right Ear: Tympanic membrane normal.      Left Ear: Tympanic membrane normal.      Nose: Nose normal.      Mouth/Throat:      Mouth: Mucous membranes are moist.   Eyes:      Extraocular Movements: Extraocular movements intact.      Conjunctiva/sclera: Conjunctivae normal.      Pupils: Pupils are equal, round, and reactive to light.   Cardiovascular:      Rate and Rhythm: Normal rate and regular rhythm.   Pulmonary:      Effort: Pulmonary effort is normal. No respiratory distress.      Breath sounds: Normal breath sounds.   Abdominal:      Palpations: Abdomen is soft.      Tenderness: There is no abdominal tenderness.   Genitourinary:     Vagina: Bleeding present.      Cervix: Normal.            Comments: Small laceration in the vaginal vault, bleeding has subsided  Musculoskeletal:         General: No swelling or tenderness. Normal range of motion.      Cervical back: Normal range of motion and neck supple. No rigidity. No muscular tenderness.   Skin:     General: Skin is warm and dry.   Neurological:      Mental Status: She is alert and oriented to person, place, and time. Mental status is at baseline.   Psychiatric:         Mood and Affect: Mood is anxious. Affect is tearful.         Behavior: Behavior is agitated.         Thought Content: Thought content normal.         Judgment: Judgment normal.  Medical Decision Making   Patient seen by me on:  02/01/2020    Assessment:  29 year old female patient presents to the ED with complaint of vaginal/ pelvic pain and spotting after intercourse. Patient states that her menstural cycle is not due for another 2 weeks. She was having intercourse with a  new partner tonight, concerned for STI as well.     Differential diagnosis:  Vaginal laceration, irregular menstal bleeding,acute cystitis, STI    Plan:   Orders Placed This Encounter      Urinalysis reflex to culture      Aerobic culture      Chlamydia plasmid DNA amplification      N. Gonorrhoeae DNA amplification      Vaginitis screen: DNA probe      Pregnancy, urine      Urinalysis with reflex to Microscopic UA and reflex to Bacterial Culture      CBC and differential      Basic metabolic panel      Urine microscopic (iq200)      acetaminophen (TYLENOL) tablet 1,000 mg      oxyCODONE (ROXICODONE) IR tablet 5 mg      ketorolac (TORADOL) 30 mg/mL injection 30 mg      cephalexin (KEFLEX) capsule 500 mg      cephalexin (KEFLEX) 500 MG capsule    Independent review of: Existing labs, chart/prior records    ED Course and Disposition:  Patient's labs reviewed, findings concerning for UTI.  Pelvic exam revealed small laceration in the vaginal vault, bleeding subsided IUD strings visible.  Patient initially was difficult to console due to pain.  Patient refused pelvic exam until she had "something stronger for pain".  Patient started on course of Keflex for urinary tract infection, STI testing pending.  Patient will follow up with her PCP.    I had a detailed discussion with the patient regarding the historical points, exam findings, and any diagnostic results supporting the discharge diagnosis. Based on the patient's history, exam, and diagnositic evaluation, there is no indication for further emergent intervention or inpatient treatment. Verbal and written discharge instructions were provided. Patient was encouraged to return for any worsening symptoms, persisting symptoms, or any other concerns. Patient was provided the opportunity to ask questions. Patient was advised to follow-up as directed in discharge instructions.    Discussed case & plan with Dr. Darrick Penna          ED Course as of Feb 19 1112   Sat Feb 01, 2020    2234 WBC(!): 14.3   2234 RBC,UA(!): 3-10   2234 WBC,UA(!): >50   2235 Neut # K/uL(!): 10.6       Ellis Savage, NP          [place], Kristian Covey, NP  02/19/20 1137

## 2020-04-14 ENCOUNTER — Other Ambulatory Visit
Admission: RE | Admit: 2020-04-14 | Discharge: 2020-04-14 | Disposition: A | Discharge status: Home / Self Care | Payer: Medicaid Other | Source: Ambulatory Visit | Attending: Registered Nurse | Admitting: Registered Nurse

## 2020-04-14 DIAGNOSIS — R531 Weakness: Secondary | ICD-10-CM | POA: Insufficient documentation

## 2020-04-14 DIAGNOSIS — R825 Elevated urine levels of drugs, medicaments and biological substances: Secondary | ICD-10-CM | POA: Insufficient documentation

## 2020-04-14 DIAGNOSIS — E039 Hypothyroidism, unspecified: Secondary | ICD-10-CM | POA: Insufficient documentation

## 2020-04-14 DIAGNOSIS — E559 Vitamin D deficiency, unspecified: Secondary | ICD-10-CM | POA: Insufficient documentation

## 2020-04-14 DIAGNOSIS — E785 Hyperlipidemia, unspecified: Secondary | ICD-10-CM | POA: Insufficient documentation

## 2020-04-14 LAB — REGIONAL CHEMICAL DEPEN SCRN, UR
Amphetamine,UR: POSITIVE — AB
Barbiturate,UR: NEGATIVE
Benzodiazepinen,UR: NEGATIVE
Buprenorphine,Ur: NEGATIVE
Cocaine/Metab,UR: NEGATIVE
Methadone Metab,UR: NEGATIVE
Methamphetamine,Ur: NEGATIVE
Opiates,UR: NEGATIVE
Oxycodone,UR: NEGATIVE
PCP,UR: NEGATIVE
THC Metabolite,UR: POSITIVE — AB
Tricyclics,UR: NEGATIVE

## 2020-04-14 LAB — COMPREHENSIVE METABOLIC PANEL
ALT: 31 U/L (ref 0–35)
AST: 33 U/L (ref 0–35)
Albumin: 3.9 g/dL (ref 3.5–5.2)
Alk Phos: 124 U/L — ABNORMAL HIGH (ref 35–105)
Anion Gap: 12 (ref 7–16)
Bilirubin,Total: 0.4 mg/dL (ref 0.0–1.2)
CO2: 23 mmol/L (ref 20–28)
Calcium: 8.9 mg/dL (ref 8.8–10.2)
Chloride: 102 mmol/L (ref 96–108)
Creatinine: 0.63 mg/dL (ref 0.51–0.95)
GFR,Black: 141 *
GFR,Caucasian: 122 *
Glucose: 99 mg/dL (ref 60–99)
Lab: 8 mg/dL (ref 6–20)
Potassium: 3.6 mmol/L (ref 3.4–4.7)
Sodium: 137 mmol/L (ref 133–145)
Total Protein: 8.1 g/dL — ABNORMAL HIGH (ref 6.3–7.7)

## 2020-04-14 LAB — CBC AND DIFFERENTIAL
Baso # K/uL: 0 10*3/uL (ref 0.0–0.1)
Basophil %: 0.5 %
Eos # K/uL: 0.2 10*3/uL (ref 0.0–0.4)
Eosinophil %: 2.2 %
Hematocrit: 45 % (ref 34–45)
Hemoglobin: 14.2 g/dL (ref 11.2–15.7)
IMM Granulocytes #: 0 10*3/uL (ref 0.0–0.0)
IMM Granulocytes: 0.5 %
Lymph # K/uL: 2 10*3/uL (ref 1.2–3.7)
Lymphocyte %: 25.7 %
MCH: 30 pg (ref 26–32)
MCHC: 31 g/dL — ABNORMAL LOW (ref 32–36)
MCV: 97 fL — ABNORMAL HIGH (ref 79–95)
Mono # K/uL: 0.5 10*3/uL (ref 0.2–0.9)
Monocyte %: 5.8 %
Neut # K/uL: 5.1 10*3/uL (ref 1.6–6.1)
Nucl RBC # K/uL: 0 10*3/uL (ref 0.0–0.0)
Nucl RBC %: 0 /100 WBC (ref 0.0–0.2)
Platelets: 252 10*3/uL (ref 160–370)
RBC: 4.7 MIL/uL (ref 3.9–5.2)
RDW: 14.1 % (ref 11.7–14.4)
Seg Neut %: 65.3 %
WBC: 7.8 10*3/uL (ref 4.0–10.0)

## 2020-04-14 LAB — T4, FREE: Free T4: 1.6 ng/dL (ref 0.9–1.7)

## 2020-04-14 LAB — VITAMIN D: 25-OH Vit Total: 21 ng/mL — ABNORMAL LOW (ref 30–60)

## 2020-04-14 LAB — LIPID PANEL
Chol/HDL Ratio: 5.1
Cholesterol: 177 mg/dL
HDL: 35 mg/dL — ABNORMAL LOW (ref 40–60)
LDL Calculated: 118 mg/dL
Non HDL Cholesterol: 142 mg/dL
Triglycerides: 119 mg/dL

## 2020-04-14 LAB — TSH: TSH: 0.6 u[IU]/mL (ref 0.27–4.20)

## 2020-04-22 ENCOUNTER — Encounter: Payer: Self-pay | Admitting: Obstetrics and Gynecology

## 2020-04-22 ENCOUNTER — Ambulatory Visit: Payer: Medicaid Other | Attending: Obstetrics and Gynecology | Admitting: Obstetrics and Gynecology

## 2020-04-22 ENCOUNTER — Other Ambulatory Visit
Admission: RE | Admit: 2020-04-22 | Discharge: 2020-04-22 | Disposition: A | Discharge status: Home / Self Care | Payer: Medicaid Other | Source: Ambulatory Visit | Attending: Obstetrics and Gynecology | Admitting: Obstetrics and Gynecology

## 2020-04-22 DIAGNOSIS — Z202 Contact with and (suspected) exposure to infections with a predominantly sexual mode of transmission: Secondary | ICD-10-CM | POA: Insufficient documentation

## 2020-04-22 DIAGNOSIS — Z30432 Encounter for removal of intrauterine contraceptive device: Secondary | ICD-10-CM | POA: Insufficient documentation

## 2020-04-22 NOTE — Patient Instructions (Signed)
1. Reviewed medications  2. Reviewed menses can be slightly abnormal over the next few months, however can still ovulate during that time  3. Reviewed most couples get pregnant within 1 year of trying. Needs annual exam. RTO in 2-3 months for annual/pap.

## 2020-04-22 NOTE — Procedures (Signed)
IUD removal    Performed by: Nicolasa Ducking, MD  Casey Hughes is a 29 y.o. female [id number] with LMP of   who requests removal of an IUD.    The current device is being removed due to desire for fertility.   Consent obtained:  Verbal  Consent given by:  Patient  The procedure risks, benefits, complications and possible alternatives were discussed with the patient.  All questions were answered prior to the patient giving informed  consent.      Pre-procedure:   Time Out: The time out was performed immediately prior to procedure, the correct patient, procedure, and site were verified.    Procedure:   The patient was placed in dorsal lithotomy position.  A speculum was inserted into the vagina and IUD strings were visualized.  Ring forceps used to secure the strings.  The IUD was removed and noted to be intact. Bleeding was minimal.  The patient tolerated the procedure with some discomfort.      Assessment   IUD removal at patient's request.    Plan:   Post removal instructions were reviewed with the patient and written information was also provided.    The plan was reviewed with the patient including:  Monitoring for heavy bleeding, fever or severe pain.  Post IUD removal patient will use nothing for contraception.   The patient will follow up for for AGY and/or as needed.     Claudius Sis, MD  .

## 2020-04-24 LAB — HIV 1&2 ANTIGEN/ANTIBODY: HIV 1&2 ANTIGEN/ANTIBODY: NONREACTIVE

## 2020-04-27 ENCOUNTER — Telehealth: Payer: Self-pay | Admitting: Obstetrics and Gynecology

## 2020-04-27 NOTE — Telephone Encounter (Signed)
Pt called office back due to missed call. Suellen was not available at this time but pt states she will be by her phone if office is able to call back.

## 2020-04-27 NOTE — Telephone Encounter (Signed)
Called pt who had her IUD removed 9/29 and intercourse 9/30. Pt states she  has sore breast and cramping, pt reports she is spotting and tired more than usual. Explained its too early for a preg test and might possibly be her period. Please advise.

## 2020-04-27 NOTE — Telephone Encounter (Signed)
Called and unable to leave a message d/t no VMB set up.

## 2020-04-27 NOTE — Telephone Encounter (Signed)
Pt was in office a week awake, on the 29th of September 2021. Pt had her iud removed. Since the removal pt has been more tired, sore breasts, spotting, cramping and more than usual nausea. Pt has had a period in about a month. Pt is with the same partner for the past 3 years and was having regular intercourse. Pt states she feels as if she might be pregnant. But not exactly sure if these symptoms are IUD related or pregnancy. Pt is requesting to speak with a nurse. Pt states number on file is the best for contact.

## 2020-04-28 NOTE — Telephone Encounter (Signed)
Called pt and unable to leave msg d/t no VM set up.

## 2020-04-29 NOTE — Telephone Encounter (Signed)
Called pt and reassured her that its possible her cycle is most likely going to start after IUD removal per LKP. Pt will f/u if she has a positive HPT.

## 2020-05-01 ENCOUNTER — Ambulatory Visit: Payer: Self-pay | Admitting: Physician Assistant

## 2020-05-06 ENCOUNTER — Ambulatory Visit: Payer: Self-pay | Admitting: Physician Assistant

## 2020-06-08 ENCOUNTER — Emergency Department
Admission: EM | Admit: 2020-06-08 | Discharge: 2020-06-08 | Disposition: A | Discharge status: Home / Self Care | Payer: Medicaid Other | Source: Ambulatory Visit | Attending: Emergency Medicine | Admitting: Emergency Medicine

## 2020-06-08 ENCOUNTER — Encounter: Payer: Self-pay | Admitting: Gastroenterology

## 2020-06-08 DIAGNOSIS — M545 Low back pain, unspecified: Secondary | ICD-10-CM

## 2020-06-08 MED ORDER — LIDOCAINE 5 % EX PTCH *I*
1.0000 | MEDICATED_PATCH | Freq: Once | CUTANEOUS | Status: DC
Start: 2020-06-08 — End: 2020-06-08
  Administered 2020-06-08: 1 via TRANSDERMAL
  Filled 2020-06-08: qty 1

## 2020-06-08 MED ORDER — KETOROLAC TROMETHAMINE 60 MG/2ML IM SOLN *I*
60.0000 mg | Freq: Once | INTRAMUSCULAR | Status: AC
Start: 2020-06-08 — End: 2020-06-08
  Administered 2020-06-08: 60 mg via INTRAMUSCULAR
  Filled 2020-06-08: qty 2

## 2020-06-08 MED ORDER — ACETAMINOPHEN 325 MG PO TABS *I*
975.0000 mg | ORAL_TABLET | Freq: Once | ORAL | Status: AC
Start: 2020-06-08 — End: 2020-06-08
  Administered 2020-06-08: 975 mg via ORAL
  Filled 2020-06-08: qty 3

## 2020-06-08 NOTE — Discharge Instructions (Addendum)
Symptom management: Heat,stretches, rest, use of over-the-counter Lidocaine patches and/or Salonpas patches, Tylenol & Motrin   Return precautions: Complete loss of muscle function or absolute muscle weakness, sudden loss of bowel and/or bladder control, urinary retention.     Come back to emergency if you develop any of the above symptoms. Schedule an appointment with your PCP or other outpatient provider for a follow-up examination for the earliest available appointment slot.

## 2020-06-08 NOTE — ED Provider Notes (Signed)
History     Chief Complaint   Patient presents with    Back Pain     HPI 29 year old female w/ PMHx of anxiety/depression, fibromyalgia and morbid obesity presents to the emergency department for evaluation of low back pain ongoing for the past few days, worse since yesterday. Patient denies ambulatory dysfunction, saddle paresthesia, urinary or bowel incontinence, urinary retention. No analgesia today. Pain worse with movement. No radiation.     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    Depression     Endometriosis 2011    Fibromyalgia     Hypothyroid     12/14 denies     Migraines     Seizures     last 03/2017 in context abrupt withdrawl from medication    SVT (supraventricular tachycardia) 2013        Patient Active Problem List   Diagnosis Code    Calculi, ureter N20.1    GERD (gastroesophageal reflux disease) K21.9    Hypothyroid E03.9    Bronchitis J40    Pneumonia J18.9    Anxiety F41.9    Depression F32.A    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    Renal calculi N20.0    SVT (supraventricular tachycardia) I47.1    Fibromyalgia M79.7    Positive ANA (antinuclear antibody) R76.8    Major depressive disorder, recurrent, moderate F33.1    PTSD (post-traumatic stress disorder) F43.10            Past Surgical History:   Procedure Laterality Date    APPENDECTOMY      BUNIONECTOMY      COLONOSCOPY      lithotripsy       Family History   Problem Relation Age of Onset    Anxiety disorder Mother     Arthritis Mother     Cancer Mother     Depression Mother     Emphysema Mother     Arthritis Father           Social History     Tobacco Use    Smoking status: Current Every Day Smoker     Packs/day: 0.50     Years: 5.00     Pack years: 2.50     Types: Vaping, Cigarettes    Smokeless tobacco: Never Used   Substance Use Topics    Alcohol use: Not  Currently    Drug use: Not Currently     Types: Marijuana, Cocaine     Comment:  clean from crack cocaine 07/07/19     Living Situation     Questions Responses    Patient lives with Other(comment)    Comment: Reports being homeless     Homeless Yes    Caregiver for other family member No    External Services Mental Health Services    Comment: [employer]     Employment Unemployed    Comment: [employer]      Domestic Violence Risk No                Review of Systems   Review of Systems   Constitutional: Negative for fatigue and fever.   Respiratory: Negative for cough and shortness of breath.    Cardiovascular: Negative for chest pain.   Musculoskeletal: Positive for arthralgias and back pain.  All other systems reviewed and are negative.      Physical Exam     Triage Vitals      First Recorded BP: 108/75, Resp: 16, Temp: 35.6 C (96.1 F), Temp src: TEMPORAL Oxygen Therapy SpO2: 98 %, O2 Device: None (Room air), Heart Rate: 88, (06/08/20 1053) Heart Rate (via Pulse Ox): 88, (06/08/20 1053).      Physical Exam  Vitals and nursing note reviewed.   Constitutional:       Appearance: She is well-developed.   HENT:      Head: Normocephalic and atraumatic.   Eyes:      Conjunctiva/sclera: Conjunctivae normal.      Pupils: Pupils are equal, round, and reactive to light.   Cardiovascular:      Rate and Rhythm: Normal rate and regular rhythm.      Heart sounds: Normal heart sounds.   Pulmonary:      Effort: Pulmonary effort is normal. No respiratory distress.      Breath sounds: Normal breath sounds. No wheezing or rales.   Abdominal:      General: Bowel sounds are normal. There is no distension.      Palpations: Abdomen is soft.      Tenderness: There is no abdominal tenderness. There is no guarding or rebound.   Musculoskeletal:      Cervical back: Normal range of motion and neck supple.        Back:    Skin:     General: Skin is warm and dry.      Capillary Refill: Capillary refill takes less than 2 seconds.   Neurological:       Mental Status: She is alert and oriented to person, place, and time.      GCS: GCS eye subscore is 4. GCS verbal subscore is 5. GCS motor subscore is 6.      Cranial Nerves: Cranial nerves are intact.      Deep Tendon Reflexes:      Reflex Scores:       Patellar reflexes are 2+ on the right side and 2+ on the left side.     Comments: Mental Status: Awake, alert, oriented x 3  Cranial Nerves:   II: PERRL  III/IV/VI: EOM intact. No nystagmus. Eyelids open equal. No ptosis  V: Facial sensation symmetric to light touch   VII: No Facial droop   VIII: Hearing intact to voice   IX/X: Normal swallowing, normal voice  XI: Equal shoulder shrug  XII: Tongue midline  Strength: LUE 5/5, LLE 5/5, RUE 5/5, RLE 5/5   Psychiatric:         Thought Content: Thought content normal.         Medical Decision Making        Patient seen by me on:  06/08/2020     Assessment:  29 y.o., female comes to the emergency department for evaluation of  low back pain ongoing for the past few days, worse since yesterday. Patient denies ambulatory dysfunction, saddle paresthesia, urinary or bowel incontinence, urinary retention. No analgesia today. No ambulatory dysfunction. Pain worse with movement--    Differential diagnosis:  Back pain, spasm, sciatica, radiculopathy    Plan:   Orders Placed This Encounter    ketorolac (TORADOL) 60 mg/2 mL injection 60 mg    acetaminophen (TYLENOL) tablet 975 mg    lidocaine (LIDODERM) 5 % patch 1 patch     Independent review of: Existing labs, XRays, CT scans, chart/prior records  ED Course and Disposition:  Patient denies any red flag symptoms such as ambulatory dysfunction, saddle paresthesia, urinary or bowel incontinence, urinary retention. No focal neuro deficits on exam. No ambulatory dysfunction. Counseled regarding symptom management (heat, stretches, rest, use of over the counter Lidocaine patches, Tylenol & Motrin) and return precautions (new sensory deficits, weakness, troubles walking,  bowel/bladder dysfunction). Informed patient to call PCP tomorrow to schedule a follow up appointment for early next week. Stable for discharge.        Plan:  I had a detailed discussion with the patient and/or guardian regarding the historical points, exam findings, and any diagnostic results supporting the discharge diagnosis. Based on the patient's history, exam, and diagnositic evaluation, there is no indication for further emergent intervention or inpatient treatment. Verbal and written discharge instructions were provided. Patient was encouraged to return for any worsening symptoms, persisting symptoms, or any other concerns. Patient was provided the opportunity to ask questions. Counseled regarding supportive measures. Patient was advised to follow-up as directed in discharge instructions.                Glendell Docker, NP       [employer] Reference #: [id number]     Patient Name: Casey Hughes   Birth Date: Sep 16, 1990   Address: [address] [place], [place] [address]   Sex: Female   Rx Written Rx Dispensed Drug Quantity Days Supply Prescriber Name Prescriber Dea # Payment Method Dispenser   11/04/2019 11/06/2019 clonazepam 0.5 mg tablet  5 14 Letta Kocher [id number] Insurance [facility]   11/02/2019 11/05/2019 tramadol hcl 50 mg tablet  6 2 Eber Hong [id number] Insurance [facility]   11/04/2019 11/05/2019 dextroamp-amphet er 20 mg cap  14 14 Letta Kocher [id number] Insurance [facility]   10/23/2019 10/24/2019 clonazepam 0.5 mg tablet  5 14 Letta Kocher [id number] Insurance [facility]   10/23/2019 10/23/2019 dextroamp-amphet er 20 mg cap  14 14 Letta Kocher [id number] Insurance [facility]   10/07/2019 10/07/2019 clonazepam 0.5 mg tablet  5 14 Letta Kocher [id number] Insurance [facility]   10/07/2019 10/07/2019 dextroamp-amphet er 20 mg cap  14 14 Letta Kocher [id number] Insurance [facility]   09/25/2019 09/25/2019 clonazepam 0.5  mg tablet  5 5 Letta Kocher [id number] Insurance [facility]   09/25/2019 09/25/2019 dextroamp-amphet er 20 mg cap  14 14 Letta Kocher [id number] Insurance [facility]   09/16/2019 09/16/2019 clonazepam 0.5 mg tablet  7 7 Oletta Darter [id number] Insurance [facility]   09/11/2019 09/11/2019 dextroamp-amphet er 20 mg cap  28 14 Oletta Darter [id number] Insurance [facility]      Patient Name: Casey Hughes   Birth Date: 1991/02/02   Address: [address] [place], [place] [address]   Sex: Female   Rx Written Rx Dispensed Drug Quantity Days Supply Prescriber Name Prescriber Dea # Payment Method Dispenser   04/23/2020 04/24/2020 clonazepam 0.5 mg tablet  60 30 Georgina Snell [id number] [facility] [id number]   04/23/2020 04/24/2020 dextroamp-amphet er 20 mg cap  60 30 Georgina Snell [id number] [facility] [phone]   04/08/2020 04/09/2020 dextroamp-amphetamin 20 mg tab  30 15 Georgina Snell [id number] [facility] [phone]   04/08/2020 04/09/2020 clonazepam 0.5 mg tablet  30 15 Georgina Snell [id number] [facility] [phone]   01/30/2020 01/31/2020 dextroamp-amphet er 20 mg cap  60 30 Burnett Corrente [id number] [facility]  [id number]   01/16/2020 01/16/2020 dextroamp-amphet er 20 mg cap  14 14 Dutch Quint MD [id number] [facility] [id number]   01/16/2020 01/16/2020 clonazepam 0.5 mg tablet  28 14 Dutch Quint MD [id number] [facility] [id number]   12/31/2019 01/01/2020 clonazepam 0.5 mg tablet  14 7 Wyline Copas MD [id number] [facility] [id number]   12/31/2019 01/01/2020 dextroamp-amphet er 20 mg cap  14 [address] MD [id number] [facility] [id number]   06/10/2019 06/11/2019 dextroamp-amphet er 30 mg cap  [address] Wyline Copas MD [id number] [facility] [id number]   06/10/2019 06/11/2019 clonazepam 0.5 mg tablet  14 7 Wyline Copas MD [id number]  Medicaid [facility] [id number]      Patient Name: Casey Hughes   Birth Date: 10/06/1990   Address: [address] [place], [place] [address]   Sex: Female   Rx Written Rx Dispensed Drug Quantity Days Supply Prescriber Name Prescriber Dea # Payment Method Dispenser   08/26/2019 08/26/2019 clonazepam 0.5 mg tablet  42 14 Sharen Counter [id number] Insurance [facility]   08/10/2019 08/10/2019 dextroamp-amphet er 20 mg cap  60 30 Karie Mainland [place], Kathie Rhodes [id number] [employer] [facility]   08/03/2019 08/03/2019 clonazepam 0.5 mg tablet  42 14 Sharen Counter [id number] [employer] [facility]   07/28/2019 07/29/2019 clonazepam 0.5 mg tablet  28 14 Sharen Counter [id number] [employer] [facility]   07/21/2019 07/21/2019 clonazepam 0.5 mg tablet  10 10 Olin Pia, Kathie Rhodes [id number] [employer] [facility]   07/11/2019 07/11/2019 clonazepam 0.5 mg tablet  14 7 Wyline Copas MD [id number] [employer] [facility]   07/11/2019 07/11/2019 dextroamp-amphet er 20 mg cap  60 [address] Gwenlyn Fudge MD [id number] [employer] [facility] Vena Rua, NP  06/09/20 [phone]

## 2020-07-06 ENCOUNTER — Emergency Department
Admission: EM | Admit: 2020-07-06 | Discharge: 2020-07-06 | Disposition: A | Discharge status: Home / Self Care | Payer: Medicaid Other | Source: Ambulatory Visit | Attending: Student in an Organized Health Care Education/Training Program | Admitting: Student in an Organized Health Care Education/Training Program

## 2020-07-06 ENCOUNTER — Encounter: Payer: Self-pay | Admitting: Student in an Organized Health Care Education/Training Program

## 2020-07-06 DIAGNOSIS — M25471 Effusion, right ankle: Secondary | ICD-10-CM

## 2020-07-06 DIAGNOSIS — M25472 Effusion, left ankle: Secondary | ICD-10-CM

## 2020-07-06 DIAGNOSIS — G5712 Meralgia paresthetica, left lower limb: Secondary | ICD-10-CM | POA: Insufficient documentation

## 2020-07-06 MED ORDER — ACETAMINOPHEN 500 MG PO TABS *I*
1000.0000 mg | ORAL_TABLET | Freq: Once | ORAL | Status: AC
Start: 2020-07-06 — End: 2020-07-06
  Administered 2020-07-06: 1000 mg via ORAL
  Filled 2020-07-06: qty 2

## 2020-07-06 MED ORDER — IBUPROFEN 600 MG PO TABS *I*
600.0000 mg | ORAL_TABLET | Freq: Once | ORAL | Status: AC
Start: 2020-07-06 — End: 2020-07-06
  Administered 2020-07-06: 600 mg via ORAL
  Filled 2020-07-06: qty 1

## 2020-07-06 NOTE — Discharge Instructions (Signed)
You were seen in the Emergency Department for leg pain, which is likely caused by an irritated nerve in your leg.     To do after discharge  - Call your primary doctor for a follow up appointment if you are not feeling better in a week  - Take acetaminophen 1000 mg every 8 hours and ibuprofen 400 mg every 6 hours as needed for pain  - Consider working to lose weight, which will reduce your risk of this happening again in the future    Return the Emergency Department if  - Your pain gets much worse  - You have new weakness in your leg  - You have any other concerning symptoms or you feel unsafe at home

## 2020-07-06 NOTE — ED Triage Notes (Signed)
"   I think I have a blood clot  C/o pain and swelling to left upper thigh for 2 days"

## 2020-07-06 NOTE — ED Provider Notes (Signed)
History   No chief complaint on file.    29 year old woman with history of fibromyalgia, chronic low back pain, migraines, CKD, borderline personality disorder presenting to the emergency department with 1 day of pain, paresthesias, and hyperesthesia primarily involving anterolateral left thigh and also radiating slightly below the knee.  She subjectively feels that the involved area is warm to the touch and swollen, but on my exam appears identical to the contralateral leg.  She denies any preceding trauma or any history of similar symptoms.  Denies any recent weight gain/loss and does not wear overly tight pants.  There is no numbness, weakness, or edema in the lower part of the leg.      History provided by:  Patient and medical records  Language interpreter used: No        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    Depression     Endometriosis 2011    Fibromyalgia     Hypothyroid     12/14 denies     Migraines     Seizures     last 03/2017 in context abrupt withdrawl from medication    SVT (supraventricular tachycardia) 2013        Patient Active Problem List   Diagnosis Code    Calculi, ureter N20.1    GERD (gastroesophageal reflux disease) K21.9    Hypothyroid E03.9    Bronchitis J40    Pneumonia J18.9    Anxiety F41.9    Depression F32.A    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    Renal calculi N20.0    SVT (supraventricular tachycardia) I47.1    Fibromyalgia M79.7    Positive ANA (antinuclear antibody) R76.8    Major depressive disorder, recurrent, moderate F33.1    PTSD (post-traumatic stress disorder) F43.10            Past Surgical History:   Procedure Laterality Date    APPENDECTOMY      BUNIONECTOMY      COLONOSCOPY      lithotripsy       Family History   Problem Relation Age of Onset    Anxiety disorder Mother     Arthritis Mother      Cancer Mother     Depression Mother     Emphysema Mother     Arthritis Father           Social History     Tobacco Use    Smoking status: Current Every Day Smoker     Packs/day: 0.50     Years: 5.00     Pack years: 2.50     Types: Vaping, Cigarettes    Smokeless tobacco: Never Used   Substance Use Topics    Alcohol use: Not Currently    Drug use: Not Currently     Types: Marijuana, Cocaine     Comment:  clean from crack cocaine 07/07/19     Living Situation     Questions Responses    Patient lives with Other(comment)    Comment: Reports being homeless     Homeless Yes    Caregiver for other family member No    External Services Mental Health Services    Comment: FLACRA     Employment Unemployed    Comment: [employer]  Domestic Violence Risk No                Review of Systems   Review of Systems   Constitutional: Negative for fever.   HENT: Negative for rhinorrhea and sore throat.    Respiratory: Negative for shortness of breath.    Cardiovascular: Negative for chest pain.   Gastrointestinal: Negative for abdominal pain.   Genitourinary: Negative for dysuria and flank pain.   Musculoskeletal: Negative for back pain and neck pain.   Skin: Negative for rash.   Neurological: Negative for weakness and numbness.        + Paraesthesias   Psychiatric/Behavioral: Negative for agitation and confusion.       Physical Exam     Triage Vitals  Triage Start: Start, (07/06/20 1534)   First Recorded BP: 118/79, Resp: 18, Temp: 36.7 C (98.1 F), Temp src: TEMPORAL Oxygen Therapy SpO2: 99 %, Oximetry Source: Lt Hand, O2 Device: None (Room air), Heart Rate: 93, (07/06/20 1535) Heart Rate (via Pulse Ox): 93, (07/06/20 1535).      Physical Exam  Vitals and nursing note reviewed.   Constitutional:       General: She is not in acute distress.  Cardiovascular:      Rate and Rhythm: Normal rate and regular rhythm.      Pulses: Normal pulses.      Heart sounds: No murmur heard.       Comments: 2+ DP pulses  Pulmonary:      Effort:  Pulmonary effort is normal. No respiratory distress.   Abdominal:      Palpations: Abdomen is soft.      Tenderness: There is no abdominal tenderness.   Musculoskeletal:      Comments:   Trace symmetric bilateral ankle edema, patient reports this is baseline   Skin:     General: Skin is warm and dry.      Capillary Refill: Capillary refill takes less than 2 seconds.      Comments:   No rash or erythema in area of pain  Pain with light touch over anterolateral L thigh   Neurological:      Mental Status: She is alert and oriented to person, place, and time.      Comments:   SILT throughout bilateral lower extremities     Left Right  Hip Flexion  5/5 5/5   Knee Flexion  5/5 5/5  Knee Extension 5/5 5/5  Ankle Plantarflexion 5/5 5/5     Ankle Dorsiflexion 5/5 5/5   EHL   5/5 5/5         Medical Decision Making   Patient seen by me on:  07/06/2020    Assessment:    29 year old woman presenting to the emergency department with 1 day, paresthesias, and hyperesthesia over anterolateral left thigh.  Symptoms and exam findings are typical of meralgia paraesthetica, likely related to morbid obesity.  Exam shows no skin lesions, warmth, or swelling in this area to suggest VZV, cellulitis, or other infection.  Localized pain and absence of asymmetric edema argue against DVT.    Plan:    Acetaminophen, ibuprofen  PCP follow-up if symptoms not improving in 1 week  Patient is at high risk for medication interactions and toxicity given her multiple chronic meds, will defer gabapentin or other specific medications for neuropathic pain to her PCP              Glendale Chard, MD  Glendale Chard, MD  07/06/20 [phone]

## 2020-07-22 ENCOUNTER — Ambulatory Visit
Admission: RE | Admit: 2020-07-22 | Discharge: 2020-07-22 | Disposition: A | Discharge status: Home / Self Care | Payer: Medicaid Other | Source: Ambulatory Visit | Attending: Pediatrics | Admitting: Pediatrics

## 2020-07-22 ENCOUNTER — Other Ambulatory Visit
Admission: RE | Admit: 2020-07-22 | Discharge: 2020-07-22 | Disposition: A | Discharge status: Home / Self Care | Payer: Medicaid Other | Source: Ambulatory Visit | Attending: Pediatrics | Admitting: Pediatrics

## 2020-07-22 ENCOUNTER — Other Ambulatory Visit: Payer: Self-pay | Admitting: Pediatrics

## 2020-07-22 DIAGNOSIS — R350 Frequency of micturition: Secondary | ICD-10-CM | POA: Insufficient documentation

## 2020-07-22 DIAGNOSIS — Q631 Lobulated, fused and horseshoe kidney: Secondary | ICD-10-CM

## 2020-07-22 LAB — BASIC METABOLIC PANEL
Anion Gap: 11 (ref 7–16)
CO2: 22 mmol/L (ref 20–28)
Calcium: 9.1 mg/dL (ref 8.8–10.2)
Chloride: 105 mmol/L (ref 96–108)
Creatinine: 0.6 mg/dL (ref 0.51–0.95)
GFR,Black: 143 *
GFR,Caucasian: 124 *
Glucose: 99 mg/dL (ref 60–99)
Lab: 12 mg/dL (ref 6–20)
Potassium: 4.7 mmol/L (ref 3.3–5.1)
Sodium: 138 mmol/L (ref 133–145)

## 2020-07-22 LAB — CBC AND DIFFERENTIAL
Baso # K/uL: 0 10*3/uL (ref 0.0–0.1)
Basophil %: 0.4 %
Eos # K/uL: 0.2 10*3/uL (ref 0.0–0.4)
Eosinophil %: 2.3 %
Hematocrit: 40 % (ref 34–45)
Hemoglobin: 13.3 g/dL (ref 11.2–15.7)
IMM Granulocytes #: 0 10*3/uL (ref 0.0–0.0)
IMM Granulocytes: 0.3 %
Lymph # K/uL: 2.1 10*3/uL (ref 1.2–3.7)
Lymphocyte %: 29.2 %
MCH: 31 pg (ref 26–32)
MCHC: 33 g/dL (ref 32–36)
MCV: 93 fL (ref 79–95)
Mono # K/uL: 0.5 10*3/uL (ref 0.2–0.9)
Monocyte %: 6.5 %
Neut # K/uL: 4.5 10*3/uL (ref 1.6–6.1)
Nucl RBC # K/uL: 0 10*3/uL (ref 0.0–0.0)
Nucl RBC %: 0 /100 WBC (ref 0.0–0.2)
Platelets: 276 10*3/uL (ref 160–370)
RBC: 4.4 MIL/uL (ref 3.9–5.2)
RDW: 12.9 % (ref 11.7–14.4)
Seg Neut %: 61.3 %
WBC: 7.3 10*3/uL (ref 4.0–10.0)

## 2020-07-22 LAB — CRP: CRP: 15 mg/L — ABNORMAL HIGH (ref 0–8)

## 2020-08-08 LAB — UNMAPPED LAB RESULTS
Basophil # (HT): 0.1 10 3/uL (ref 0.0–0.2)
Basophil % (HT): 1 % (ref 0–3)
Eosinophil # (HT): 0.2 10 3/uL (ref 0.0–0.6)
Eosinophil % (HT): 2 % (ref 0–5)
Hematocrit (HT): 44 % (ref 35–47)
Hemoglobin (HGB) (HT): 14.4 g/dL (ref 12.0–16.0)
Lymphocyte # (HT): 2.7 10 3/uL (ref 1.0–4.8)
Lymphocyte % (HT): 22 % (ref 15–45)
MCHC (HT): 32.7 g/dL (ref 31.0–37.5)
MCV (HT): 94 fL (ref 80–100)
Mean Corpuscular Hemoglobin (MCH) (HT): 30.6 pg (ref 26.0–34.0)
Monocyte # (HT): 0.9 10 3/uL (ref 0.1–1.0)
Monocyte % (HT): 7 % (ref 0–15)
Neutrophil # (HT): 8.4 10 3/uL — ABNORMAL HIGH (ref 1.8–8.0)
Platelets (HT): 268 10 3/uL (ref 150–450)
RBC (HT): 4.71 10 6/uL (ref 3.80–5.20)
RDW (HT): 13.3 % (ref 0.0–15.2)
Seg Neut % (HT): 68 % (ref 45–75)
WBC (HT): 12.3 10 3/uL — ABNORMAL HIGH (ref 4.0–11.0)

## 2020-10-28 ENCOUNTER — Encounter: Payer: Self-pay | Admitting: Gastroenterology

## 2020-11-01 LAB — UNMAPPED LAB RESULTS
Basophil # (HT): 0 10 3/uL (ref 0.0–0.2)
Basophil % (HT): 0 % (ref 0–3)
Eosinophil # (HT): 0.2 10 3/uL (ref 0.0–0.6)
Eosinophil % (HT): 1 % (ref 0–5)
Hematocrit (HT): 40 % (ref 35–47)
Hemoglobin (HGB) (HT): 13.6 g/dL (ref 12.0–16.0)
Lymphocyte # (HT): 2.9 10 3/uL (ref 1.0–4.8)
Lymphocyte % (HT): 22 % (ref 15–45)
MCHC (HT): 33.8 g/dL (ref 31.0–37.5)
MCV (HT): 90 fL (ref 80–100)
Mean Corpuscular Hemoglobin (MCH) (HT): 30.5 pg (ref 26.0–34.0)
Monocyte # (HT): 0.7 10 3/uL (ref 0.1–1.0)
Monocyte % (HT): 5 % (ref 0–15)
Neutrophil # (HT): 9.6 10 3/uL — ABNORMAL HIGH (ref 1.8–8.0)
Platelets (HT): 234 10 3/uL (ref 150–450)
RBC (HT): 4.46 10 6/uL (ref 3.80–5.20)
RDW (HT): 13.2 % (ref 0.0–15.2)
Seg Neut % (HT): 72 % (ref 45–75)
WBC (HT): 13.4 10 3/uL — ABNORMAL HIGH (ref 4.0–11.0)

## 2020-11-17 ENCOUNTER — Telehealth: Payer: Self-pay

## 2020-11-17 NOTE — Telephone Encounter (Signed)
Per [facility] records released 11/11/20

## 2020-12-17 ENCOUNTER — Encounter: Payer: Self-pay | Admitting: Urology

## 2020-12-17 ENCOUNTER — Telehealth: Payer: Self-pay | Admitting: Urology

## 2020-12-17 NOTE — Telephone Encounter (Signed)
Copied from CRM [id number]. Topic: Access to Care - Speak to Provider/Office Staff  >> Dec 17, 2020 10:04 AM Mariel Kansky wrote:   [facility]-Dr.Meaker statses Lynn Ito is no longer a patient with them

## 2020-12-18 NOTE — Telephone Encounter (Signed)
noted 

## 2021-01-07 LAB — UNMAPPED LAB RESULTS
Basophil # (HT): 0 10 3/uL (ref 0.0–0.2)
Basophil % (HT): 0 % (ref 0–3)
Eosinophil # (HT): 0.2 10 3/uL (ref 0.0–0.6)
Eosinophil % (HT): 2 % (ref 0–5)
Hematocrit (HT): 42 % (ref 35–47)
Hemoglobin (HGB) (HT): 13.6 g/dL (ref 12.0–16.0)
Lymphocyte # (HT): 2.1 10 3/uL (ref 1.0–4.8)
Lymphocyte % (HT): 22 % (ref 15–45)
MCHC (HT): 32.8 g/dL (ref 31.0–37.5)
MCV (HT): 95 fL (ref 80–100)
Mean Corpuscular Hemoglobin (MCH) (HT): 31.1 pg (ref 26.0–34.0)
Monocyte # (HT): 0.6 10 3/uL (ref 0.1–1.0)
Monocyte % (HT): 6 % (ref 0–15)
Neutrophil # (HT): 6.3 10 3/uL (ref 1.8–8.0)
Platelets (HT): 233 10 3/uL (ref 150–450)
RBC (HT): 4.37 10 6/uL (ref 3.80–5.20)
RDW (HT): 13.3 % (ref 0.0–15.2)
Seg Neut % (HT): 69 % (ref 45–75)
WBC (HT): 9.2 10 3/uL (ref 4.0–11.0)

## 2021-01-13 ENCOUNTER — Telehealth: Payer: Self-pay | Admitting: Obstetrics and Gynecology

## 2021-01-13 NOTE — Telephone Encounter (Signed)
Pt states she has been getting 2 periods a month, reports may be pregnant however she can't afford a HPT, states she and her partner have been trying to conceive for a few months, reports last period was 3 days ago- reports intense uterine pain states "I can't bend over", also reports feeling a lump in her Rt breast states "my sister had a lump in her breast and they had to biopsy it, I just want to get checked out"

## 2021-01-13 NOTE — Telephone Encounter (Signed)
Patient called stating that she is not sure if she is pregnant currently, also stated that she is getting two periods a month at this time. Patient reports that she just had a period 3 days ago in which only lasted three days. Patient stated that she also found a lump in her right breast top of the breast and is sensitive to the touch.

## 2021-01-14 NOTE — Telephone Encounter (Signed)
Attempted to call pt, VM full. Sent MyChart message sent

## 2021-01-15 ENCOUNTER — Ambulatory Visit: Payer: Medicaid Other | Admitting: Obstetrics and Gynecology

## 2021-01-29 ENCOUNTER — Ambulatory Visit: Payer: Medicaid Other | Admitting: Obstetrics and Gynecology

## 2021-02-04 LAB — UNMAPPED LAB RESULTS
Basophil # (HT): 0 10 3/uL (ref 0.0–0.1)
Basophil % (HT): 0 % (ref 0–3)
Eosinophil # (HT): 0 10 3/uL (ref 0.0–0.8)
Eosinophil % (HT): 0 % (ref 0–5)
Hematocrit (HT): 41 % (ref 38–48)
Hemoglobin (HGB) (HT): 13.8 g/dL (ref 11.9–15.9)
Lymphocyte # (HT): 2.5 10 3/uL (ref 0.6–3.3)
Lymphocyte % (HT): 17 % (ref 15–45)
MCHC (HT): 33.5 g/dL (ref 31.1–34.5)
MCV (HT): 92 fL (ref 80–97)
Mean Corpuscular Hemoglobin (MCH) (HT): 30.7 pg — ABNORMAL HIGH (ref 26.6–30.6)
Mean Platelet Volume (HT): 9 fL (ref 9.0–12.2)
Monocyte # (HT): 0.5 10 3/uL (ref 0.1–1.1)
Monocyte % (HT): 4 % (ref 0–15)
Neutrophil # (HT): 11.7 10 3/uL — ABNORMAL HIGH (ref 2.0–7.1)
Platelets (HT): 282 10 3/uL (ref 142–414)
RBC (HT): 4.5 10 6/uL (ref 4.04–5.48)
RDW (HT): 13.1 % (ref 12.9–16.3)
Seg Neut % (HT): 79 % — ABNORMAL HIGH (ref 45–75)
WBC (HT): 14.9 10 3/uL — ABNORMAL HIGH (ref 4.8–10.4)

## 2021-02-10 ENCOUNTER — Encounter: Payer: Self-pay | Admitting: Gastroenterology

## 2021-02-11 ENCOUNTER — Other Ambulatory Visit: Payer: Self-pay | Admitting: Urology

## 2021-02-11 DIAGNOSIS — R3989 Other symptoms and signs involving the genitourinary system: Secondary | ICD-10-CM

## 2021-02-12 ENCOUNTER — Ambulatory Visit: Payer: Medicaid Other | Admitting: Urology

## 2021-02-14 ENCOUNTER — Encounter: Payer: Self-pay | Admitting: Gastroenterology

## 2021-02-16 ENCOUNTER — Encounter: Payer: Self-pay | Admitting: Urology

## 2021-02-23 ENCOUNTER — Telehealth: Payer: Self-pay

## 2021-02-23 NOTE — Telephone Encounter (Signed)
Writer placed call to patient regarding appointment 8/3 at 8:30 with Casimiro Needle and unable to leave message as box is full for  Casey Hughes to complete the fee interview.    Casey Hughes is currently insured, under legal name of Casey Hughes, with [facility] [employer] which covered in full

## 2021-02-24 ENCOUNTER — Ambulatory Visit: Payer: Medicaid Other

## 2021-02-24 NOTE — NoShare Progress Note (Signed)
Adult Partial Hospitalization Program  Clinical Management Note     Date of Scheduled Service: 02/24/2021    No PHP Intake Evaluation after Registration    Patient registered for a PHP intake evaluation as scheduled on 8/3 @ 8:30 AM.  She was referred by [facility].  However, no intake evaluation was completed after registration.  The patient declined to complete the evaluation because did not show up for her scheduled intake. This Clinical research associate contacted patient with her contact number listed on the referral documentation from [facility], #[phone], and spoke to patient who said she was not able to schedule a ride to take her to the intake appt. Pt reports she did want to reschedule and denied safety concerns. Intake coordinator will reschedule pt's intake appt. The referral source was contacted and informed of the patient's decision to not complete the intake evaluation.  Ms. Currie plans to continue in mental health treatment with [facility].    The case is closed in PHP with no intake after registration.

## 2021-03-15 ENCOUNTER — Ambulatory Visit: Payer: Medicaid Other | Admitting: Obstetrics and Gynecology

## 2021-03-22 LAB — UNMAPPED LAB RESULTS
Basophil # (HT): 0 10 3/uL (ref 0.0–0.1)
Basophil % (HT): 0 % (ref 0–3)
Eosinophil # (HT): 0.1 10 3/uL (ref 0.0–0.8)
Eosinophil % (HT): 2 % (ref 0–5)
Hematocrit (HT): 38 % (ref 38–48)
Hemoglobin (HGB) (HT): 12.7 g/dL (ref 11.9–15.9)
Lymphocyte # (HT): 1.4 10 3/uL (ref 0.6–3.3)
Lymphocyte % (HT): 26 % (ref 15–45)
MCHC (HT): 33.7 g/dL (ref 31.1–34.5)
MCV (HT): 91 fL (ref 80–97)
Mean Corpuscular Hemoglobin (MCH) (HT): 30.5 pg (ref 26.6–30.6)
Mean Platelet Volume (HT): 9.3 fL (ref 9.0–12.2)
Monocyte # (HT): 0.6 10 3/uL (ref 0.1–1.1)
Monocyte % (HT): 11 % (ref 0–15)
Neutrophil # (HT): 3.2 10 3/uL (ref 2.0–7.1)
Platelets (HT): 250 10 3/uL (ref 142–414)
RBC (HT): 4.16 10 6/uL (ref 4.04–5.48)
RDW (HT): 13.8 % (ref 12.9–16.3)
Seg Neut % (HT): 60 % (ref 45–75)
WBC (HT): 5.4 10 3/uL (ref 4.8–10.4)

## 2021-03-29 ENCOUNTER — Emergency Department
Admission: EM | Admit: 2021-03-29 | Discharge: 2021-03-29 | Disposition: A | Discharge status: Home / Self Care | Payer: Medicaid Other | Source: Ambulatory Visit | Attending: Emergency Medicine | Admitting: Emergency Medicine

## 2021-03-29 DIAGNOSIS — R059 Cough, unspecified: Secondary | ICD-10-CM

## 2021-03-29 DIAGNOSIS — R0981 Nasal congestion: Secondary | ICD-10-CM

## 2021-03-29 DIAGNOSIS — R197 Diarrhea, unspecified: Secondary | ICD-10-CM

## 2021-03-29 DIAGNOSIS — F17210 Nicotine dependence, cigarettes, uncomplicated: Secondary | ICD-10-CM | POA: Insufficient documentation

## 2021-03-29 DIAGNOSIS — F17290 Nicotine dependence, other tobacco product, uncomplicated: Secondary | ICD-10-CM | POA: Insufficient documentation

## 2021-03-29 DIAGNOSIS — R112 Nausea with vomiting, unspecified: Secondary | ICD-10-CM

## 2021-03-29 DIAGNOSIS — J3489 Other specified disorders of nose and nasal sinuses: Secondary | ICD-10-CM

## 2021-03-29 DIAGNOSIS — R110 Nausea: Secondary | ICD-10-CM

## 2021-03-29 DIAGNOSIS — U071 COVID-19: Secondary | ICD-10-CM | POA: Insufficient documentation

## 2021-03-29 LAB — CBC AND DIFFERENTIAL
Baso # K/uL: 0 10*3/uL (ref 0.0–0.1)
Basophil %: 0 %
Eos # K/uL: 0.2 10*3/uL (ref 0.0–0.4)
Eosinophil %: 2 %
Hematocrit: 40 % (ref 34–45)
Hemoglobin: 13 g/dL (ref 11.2–15.7)
Lymph # K/uL: 2.9 10*3/uL (ref 1.2–3.7)
Lymphocyte %: 38 %
MCH: 31 pg (ref 26–32)
MCHC: 33 g/dL (ref 32–36)
MCV: 93 fL (ref 79–95)
Mono # K/uL: 0.2 10*3/uL (ref 0.2–0.9)
Monocyte %: 2 %
Neut # K/uL: 4.4 10*3/uL (ref 1.6–6.1)
Nucl RBC # K/uL: 0 10*3/uL (ref 0.0–0.0)
Nucl RBC %: 0 /100 WBC (ref 0.0–0.2)
Platelets: 277 10*3/uL (ref 160–370)
RBC: 4.2 MIL/uL (ref 3.9–5.2)
RDW: 13 % (ref 11.7–14.4)
Seg Neut %: 58 %
WBC: 7.6 10*3/uL (ref 4.0–10.0)

## 2021-03-29 LAB — COMPREHENSIVE METABOLIC PANEL
ALT: 15 U/L (ref 0–35)
AST: 22 U/L (ref 0–35)
Albumin: 3.5 g/dL (ref 3.5–5.2)
Alk Phos: 106 U/L — ABNORMAL HIGH (ref 35–105)
Anion Gap: 9 (ref 7–16)
Bilirubin,Total: <0.2 mg/dL (ref 0.0–1.2)
CO2: 24 mmol/L (ref 20–28)
Calcium: 8.6 mg/dL — ABNORMAL LOW (ref 8.8–10.2)
Chloride: 105 mmol/L (ref 96–108)
Creatinine: 0.58 mg/dL (ref 0.51–0.95)
Glucose: 92 mg/dL (ref 60–99)
Lab: 10 mg/dL (ref 6–20)
Potassium: 4.4 mmol/L (ref 3.4–4.7)
Sodium: 138 mmol/L (ref 133–145)
Total Protein: 7.3 g/dL (ref 6.3–7.7)
eGFR BY CREAT: 125 *

## 2021-03-29 LAB — RSV PCR: RSV PCR: 0

## 2021-03-29 LAB — PERFORMING LAB

## 2021-03-29 LAB — DIFF MANUAL: Diff Based On: 100 CELLS

## 2021-03-29 LAB — COVID-19 NAAT (PCR): COVID-19 NAAT (PCR): POSITIVE — AB

## 2021-03-29 LAB — INFLUENZA B PCR: Influenza B PCR: 0

## 2021-03-29 LAB — INFLUENZA A: Influenza A PCR: 0

## 2021-03-29 MED ORDER — ONDANSETRON HCL 2 MG/ML IV SOLN *I*
4.0000 mg | Freq: Once | INTRAMUSCULAR | Status: DC
Start: 2021-03-29 — End: 2021-03-29

## 2021-03-29 MED ORDER — SODIUM CHLORIDE 0.9 % IV BOLUS *I*
1000.0000 mL | Freq: Once | Status: AC
Start: 2021-03-29 — End: 2021-03-29
  Administered 2021-03-29: 1000 mL via INTRAVENOUS

## 2021-03-29 MED ORDER — ONDANSETRON 4 MG PO TBDP *I*
4.0000 mg | ORAL_TABLET | Freq: Three times a day (TID) | ORAL | 0 refills | Status: AC | PRN
Start: 2021-03-29 — End: 2021-04-01

## 2021-03-29 NOTE — ED Provider Notes (Addendum)
History     Chief Complaint   Patient presents with    Shortness of Breath     HPI  Casey Hughes is a 30 y.o. female PMH substance use (marijuana) recent discharged from [facility]  presents with cough, congestion, shortness of breath, chills since 8/27. Pt Covid postive on 8/29. States she has not eaten in 3 days, has had 2 days of diarrhea, continues to have URI symptoms.    Pt denies any chest pain, diaphoresis, palpitations, leg edema.    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    Depression     Endometriosis 2011    Fibromyalgia     Hypothyroid     12/14 denies     Migraines     Seizures     last 03/2017 in context abrupt withdrawl from medication    SVT (supraventricular tachycardia) 2013        Patient Active Problem List   Diagnosis Code    Calculi, ureter N20.1    GERD (gastroesophageal reflux disease) K21.9    Hypothyroid E03.9    Bronchitis J40    Pneumonia J18.9    Anxiety F41.9    Depression F32.A    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    Renal calculi N20.0    SVT (supraventricular tachycardia) I47.1    Fibromyalgia M79.7    Positive ANA (antinuclear antibody) R76.8    Major depressive disorder, recurrent, moderate F33.1    PTSD (post-traumatic stress disorder) F43.10            Past Surgical History:   Procedure Laterality Date    APPENDECTOMY      BUNIONECTOMY      COLONOSCOPY      lithotripsy       Family History   Problem Relation Age of Onset    Anxiety disorder Mother     Arthritis Mother     Cancer Mother     Depression Mother     Emphysema Mother     Arthritis Father           Social History     Tobacco Use    Smoking status: Current Every Day Smoker     Packs/day: 0.50     Years: 5.00     Pack years: 2.50     Types: Vaping, Cigarettes    Smokeless tobacco: Never Used   Substance Use Topics    Alcohol use: Not  Currently    Drug use: Not Currently     Types: Marijuana, Cocaine     Comment:  clean from crack cocaine 07/07/19     Living Situation     Questions Responses    Patient lives with Other(comment)    Comment: Reports being homeless     Homeless Yes    Caregiver for other family member No    External Services Mental Health Services    Comment: FLACRA     Employment Unemployed    Comment: [employer]      Domestic Violence Risk No                Review of Systems   Review of Systems   Constitutional: Negative for chills and fever.   HENT: Positive for congestion and rhinorrhea. Negative for ear discharge, ear pain,  facial swelling, sinus pressure, sinus pain, trouble swallowing and voice change.    Respiratory: Positive for cough. Negative for chest tightness and shortness of breath.    Cardiovascular: Negative for chest pain and palpitations.   Gastrointestinal: Positive for diarrhea, nausea and vomiting. Negative for abdominal distention.   Genitourinary: Negative for dysuria and flank pain.   Musculoskeletal: Negative for neck pain and neck stiffness.   Neurological: Negative for syncope and weakness.   Psychiatric/Behavioral: Negative for behavioral problems and self-injury.   All other systems reviewed and are negative.      Physical Exam     Triage Vitals      First Recorded BP: (!) 121/92, Resp: 20, Temp: 35.6 C (96.1 F), Temp src: TEMPORAL Oxygen Therapy SpO2: 97 %, O2 Device: None (Room air), Heart Rate: 84, (03/29/21 1323) Heart Rate (via Pulse Ox): 84, (03/29/21 1323).      Physical Exam  Vitals and nursing note reviewed.   Constitutional:       General: She is not in acute distress.     Appearance: She is not ill-appearing.   HENT:      Nose: No congestion or rhinorrhea.      Mouth/Throat:      Mouth: Mucous membranes are moist. No oral lesions.      Pharynx: No pharyngeal swelling, posterior oropharyngeal erythema or uvula swelling.   Neck:      Thyroid: No thyromegaly.   Cardiovascular:      Rate and  Rhythm: Normal rate.      Heart sounds: Normal heart sounds. No murmur heard.  Pulmonary:      Effort: Pulmonary effort is normal. No tachypnea or respiratory distress.      Breath sounds: No decreased breath sounds or wheezing.   Chest:      Chest wall: No mass or tenderness.   Abdominal:      General: There is no distension.      Tenderness: There is no abdominal tenderness.   Musculoskeletal:      Right lower leg: No edema.      Left lower leg: No edema.   Lymphadenopathy:      Cervical: No cervical adenopathy.   Neurological:      Mental Status: She is oriented to person, place, and time.   Psychiatric:         Mood and Affect: Mood normal.         Medical Decision Making   Patient seen by me on:  03/29/2021    Assessment:  Casey Hughes is a 30 y.o. female PMH CKD, horseshoe kidney, substance use (marijuana) presents with 9 days of congestion and rhinorrhea, shortness of breath.  COVID-positive 8/27.  Patient states she has not eaten in 3 days, endorses multiple episodes of diarrhea and vomiting in the last 3 days.  Patient well-appearing, no acute distress, vital signs stable.  Abdomen soft, no TTP, mucous membranes moist, lungs CTA-bilaterally.  Symptoms likely due to persistent COVID 19.  Will give IV fluid bolus and check lab work for dehydration or electrolyte derangement.  Very low suspicion PE at this time based off physical exam and history.    Differential diagnosis:  See above.    Plan:  Orders Placed This Encounter      Influenza A PCR      Influenza B PCR      RSV PCR      CBC and differential      Comprehensive metabolic panel  COVID-19 PCR      Initiate COVID precautions      ondansetron (ZOFRAN) injection 4 mg      sodium chloride 0.9 % bolus 1,000 mL     Medications  ondansetron (ZOFRAN) injection 4 mg (4 mg Intravenous Refused 03/29/21 1540)  sodium chloride 0.9 % bolus 1,000 mL (has no administration in time range)      ED Course and Disposition:  In the ED, patient remained fatigued but  well-appearing.  No signs or lab work indicative of dehydration or electrolyte derangement.  Patient offered Zofran however declined at this time.  Patient to continue symptomatic treatment outpatient with Tylenol/ibuprofen/Zofran as needed for URI in the setting of COVID-19.  Patient amenable to plan.              Lajean Saver, DO      Resident Attestation:    Patient seen by me on 03/29/2021.    I saw and evaluated the patient. I agree with the resident's/fellow's findings and plan of care as documented above.    Author:  Reubin Milan, MD       Lajean Saver, DO  Resident  03/29/21 2106       Sherrin Daisy Ronney Asters, MD  04/02/21 [phone]

## 2021-03-29 NOTE — ED Notes (Signed)
Discharge Instructions reviewed with patient.  The patient expressed understanding and is ready to be discharged.  Due to COVID-19 Infection Risk Reduction measures, patient and [facility] staff member are not using a pen to sign the Discharge Instructions.

## 2021-03-29 NOTE — ED Triage Notes (Signed)
Pt comes to ED via ambulance for difficulty breathing.   Pt was tested positive for covid and per EMS pt was discharged from [facility] today.  Pt told EMS that she has had  Poor PO intake.            Prehospital medications given: No

## 2021-03-30 NOTE — Result Encounter Note (Signed)
Called patient and updated her regarding positive COVID-19 test results.  No further questions, patient verbalizes understanding.  Ellis Savage, NP  March 30, 2021 1:39 PM     Ellis Savage, NP  03/30/21 1339

## 2021-04-02 ENCOUNTER — Telehealth: Payer: Self-pay | Admitting: Obstetrics and Gynecology

## 2021-04-02 NOTE — Telephone Encounter (Signed)
Called pt and she reports she would like to come in for a preg test d/t having unprotected intercourse 2 days ago. Advised pt that based on her last period 03/04/2021 she should wait until a missed period and then do a HPT. Pt would like an appt to discuss BC options. Pt has no showed the last few appts. Please call and schedule an annual with [facility], the sooner the better d/t her pap hx. Ty

## 2021-04-02 NOTE — Telephone Encounter (Signed)
Pt had unprotected sex two days ago and is calling to see if she can be seen by [facility] to find out if she is pregnant.  Periods are irregular.

## 2021-04-05 DIAGNOSIS — F17200 Nicotine dependence, unspecified, uncomplicated: Secondary | ICD-10-CM | POA: Insufficient documentation

## 2021-04-05 DIAGNOSIS — F1220 Cannabis dependence, uncomplicated: Secondary | ICD-10-CM | POA: Insufficient documentation

## 2021-04-05 DIAGNOSIS — F1420 Cocaine dependence, uncomplicated: Secondary | ICD-10-CM | POA: Insufficient documentation

## 2021-04-05 NOTE — Telephone Encounter (Signed)
Patient has been scheduled

## 2021-04-14 DIAGNOSIS — J302 Other seasonal allergic rhinitis: Secondary | ICD-10-CM | POA: Insufficient documentation

## 2021-04-14 DIAGNOSIS — K5900 Constipation, unspecified: Secondary | ICD-10-CM | POA: Insufficient documentation

## 2021-04-15 LAB — UNMAPPED LAB RESULTS
HIV 1&2 ANTIGEN/ANTIBODY (HT): NONREACTIVE
Hematocrit (HT): 39 % (ref 35–47)
Hemoglobin (HGB) (HT): 12.7 g/dL (ref 12.0–16.0)
Hep C Ab: NONREACTIVE
MCHC (HT): 32.6 g/dL (ref 31.0–37.5)
MCV (HT): 94 fL (ref 80–100)
Mean Corpuscular Hemoglobin (MCH) (HT): 30.5 pg (ref 26.0–34.0)
Platelets (HT): 309 10 3/uL (ref 150–450)
RBC (HT): 4.16 10 6/uL (ref 3.80–5.20)
RDW (HT): 14 % (ref 9.0–15.2)
WBC (HT): 8.2 10 3/uL (ref 4.0–11.0)

## 2021-04-15 LAB — TREPONEMA PALLIDUM (SYPHILIS) ANTIBODY (HT): Syphilis Treponemal Ab (HT): NONREACTIVE

## 2021-04-23 ENCOUNTER — Encounter: Payer: Self-pay | Admitting: Gastroenterology

## 2021-04-26 ENCOUNTER — Encounter: Payer: Self-pay | Admitting: Gastroenterology

## 2021-04-29 ENCOUNTER — Emergency Department: Payer: Medicaid Other

## 2021-04-29 ENCOUNTER — Emergency Department
Admission: EM | Admit: 2021-04-29 | Discharge: 2021-04-29 | Disposition: A | Discharge status: Home / Self Care | Payer: Medicaid Other | Source: Ambulatory Visit | Attending: Emergency Medicine | Admitting: Emergency Medicine

## 2021-04-29 DIAGNOSIS — Z20822 Contact with and (suspected) exposure to covid-19: Secondary | ICD-10-CM | POA: Insufficient documentation

## 2021-04-29 DIAGNOSIS — R1011 Right upper quadrant pain: Secondary | ICD-10-CM | POA: Insufficient documentation

## 2021-04-29 DIAGNOSIS — K760 Fatty (change of) liver, not elsewhere classified: Secondary | ICD-10-CM

## 2021-04-29 DIAGNOSIS — F17290 Nicotine dependence, other tobacco product, uncomplicated: Secondary | ICD-10-CM | POA: Insufficient documentation

## 2021-04-29 DIAGNOSIS — F17210 Nicotine dependence, cigarettes, uncomplicated: Secondary | ICD-10-CM | POA: Insufficient documentation

## 2021-04-29 DIAGNOSIS — R10811 Right upper quadrant abdominal tenderness: Secondary | ICD-10-CM | POA: Insufficient documentation

## 2021-04-29 DIAGNOSIS — R10816 Epigastric abdominal tenderness: Secondary | ICD-10-CM | POA: Insufficient documentation

## 2021-04-29 DIAGNOSIS — R1013 Epigastric pain: Secondary | ICD-10-CM

## 2021-04-29 DIAGNOSIS — Q631 Lobulated, fused and horseshoe kidney: Secondary | ICD-10-CM | POA: Insufficient documentation

## 2021-04-29 DIAGNOSIS — R109 Unspecified abdominal pain: Secondary | ICD-10-CM

## 2021-04-29 DIAGNOSIS — R197 Diarrhea, unspecified: Secondary | ICD-10-CM | POA: Insufficient documentation

## 2021-04-29 DIAGNOSIS — F1411 Cocaine abuse, in remission: Secondary | ICD-10-CM | POA: Insufficient documentation

## 2021-04-29 DIAGNOSIS — N189 Chronic kidney disease, unspecified: Secondary | ICD-10-CM | POA: Insufficient documentation

## 2021-04-29 LAB — CBC AND DIFFERENTIAL
Baso # K/uL: 0.1 10*3/uL (ref 0.0–0.1)
Basophil %: 0.5 %
Eos # K/uL: 0.1 10*3/uL (ref 0.0–0.4)
Eosinophil %: 1.4 %
Hematocrit: 40 % (ref 34–45)
Hemoglobin: 13.2 g/dL (ref 11.2–15.7)
IMM Granulocytes #: 0 10*3/uL (ref 0.0–0.0)
IMM Granulocytes: 0.4 %
Lymph # K/uL: 2.8 10*3/uL (ref 1.2–3.7)
Lymphocyte %: 28.2 %
MCH: 31 pg (ref 26–32)
MCHC: 33 g/dL (ref 32–36)
MCV: 93 fL (ref 79–95)
Mono # K/uL: 0.7 10*3/uL (ref 0.2–0.9)
Monocyte %: 7.5 %
Neut # K/uL: 6.1 10*3/uL (ref 1.6–6.1)
Nucl RBC # K/uL: 0 10*3/uL (ref 0.0–0.0)
Nucl RBC %: 0 /100 WBC (ref 0.0–0.2)
Platelets: 222 10*3/uL (ref 160–370)
RBC: 4.3 MIL/uL (ref 3.9–5.2)
RDW: 13.5 % (ref 11.7–14.4)
Seg Neut %: 62 %
WBC: 9.8 10*3/uL (ref 4.0–10.0)

## 2021-04-29 LAB — COMPREHENSIVE METABOLIC PANEL
ALT: 13 U/L (ref 0–35)
Albumin: 3.4 g/dL — ABNORMAL LOW (ref 3.5–5.2)
Alk Phos: 96 U/L (ref 35–105)
Anion Gap: 11 (ref 7–16)
Bilirubin,Total: <0.2 mg/dL (ref 0.0–1.2)
CO2: 19 mmol/L — ABNORMAL LOW (ref 20–28)
Calcium: 8.8 mg/dL (ref 8.8–10.2)
Chloride: 102 mmol/L (ref 96–108)
Creatinine: 0.49 mg/dL — ABNORMAL LOW (ref 0.51–0.95)
Glucose: 81 mg/dL (ref 60–99)
Lab: 6 mg/dL (ref 6–20)
Sodium: 132 mmol/L — ABNORMAL LOW (ref 133–145)
Total Protein: 7.1 g/dL (ref 6.3–7.7)
eGFR BY CREAT: 130 *

## 2021-04-29 LAB — PERFORMING LAB

## 2021-04-29 LAB — UNABLE TO PERFORM ADD-ON TESTING 1

## 2021-04-29 LAB — PREGNANCY, URINE: Preg Test,UR: NEGATIVE

## 2021-04-29 LAB — COVID-19 NAAT (PCR): COVID-19 NAAT (PCR): NEGATIVE

## 2021-04-29 LAB — LIPASE: Lipase: 22 U/L (ref 13–60)

## 2021-04-29 MED ORDER — FAMOTIDINE (PF) 20 MG/2ML IV SOLN *I*
20.0000 mg | Freq: Once | INTRAVENOUS | Status: AC
Start: 2021-04-29 — End: 2021-04-29
  Administered 2021-04-29: 20 mg via INTRAVENOUS
  Filled 2021-04-29: qty 2

## 2021-04-29 MED ORDER — SODIUM CHLORIDE 0.9 % IV BOLUS *I*
1000.0000 mL | Freq: Once | Status: AC
Start: 2021-04-29 — End: 2021-04-29
  Administered 2021-04-29: 1000 mL via INTRAVENOUS

## 2021-04-29 MED ORDER — HYDROMORPHONE HCL PF 1 MG/ML IJ SOLN *WRAPPED*
0.5000 mg | Freq: Once | INTRAMUSCULAR | Status: AC
Start: 2021-04-29 — End: 2021-04-29
  Administered 2021-04-29: 0.5 mg via INTRAVENOUS
  Filled 2021-04-29: qty 1

## 2021-04-29 MED ORDER — KETOROLAC TROMETHAMINE 15 MG/ML IJ SOLN *I*
15.0000 mg | Freq: Once | INTRAMUSCULAR | Status: AC
Start: 2021-04-29 — End: 2021-04-29
  Administered 2021-04-29: 15 mg via INTRAVENOUS
  Filled 2021-04-29: qty 1

## 2021-04-29 MED ORDER — HYDROMORPHONE HCL PF 1 MG/ML IJ SOLN *WRAPPED*
0.2500 mg | Freq: Once | INTRAMUSCULAR | Status: AC
Start: 2021-04-29 — End: 2021-04-29
  Administered 2021-04-29: 0.25 mg via INTRAVENOUS
  Filled 2021-04-29: qty 1

## 2021-04-29 MED ORDER — IOHEXOL 350 MG/ML (OMNIPAQUE) IV SOLN 500ML BOTTLE *I*
1.0000 mL | Freq: Once | INTRAVENOUS | Status: AC
Start: 2021-04-29 — End: 2021-04-29
  Administered 2021-04-29: 150 mL via INTRAVENOUS

## 2021-04-29 MED ORDER — SODIUM CHLORIDE 0.9 % 25 ML IV SOLN *I*
25.0000 mg | Freq: Once | INTRAVENOUS | Status: AC
Start: 2021-04-29 — End: 2021-04-29
  Administered 2021-04-29: 25 mg via INTRAVENOUS
  Filled 2021-04-29: qty 1

## 2021-04-29 NOTE — ED Provider Notes (Signed)
History     Chief Complaint   Patient presents with    Abdominal Pain     right upper quad     Casey Hughes is a 30 y.o. female with a PMHx significant for borderline personality disorder, depression, CKD, anxiety, asthma, horseshoe kidney, renal colic who presents to the ED with a chief complaint of abdominal pain.  Patient states that she has had about been having right upper quadrant abdominal pain for about 1 week.  The pain is worsened with eating and associated with nausea but no vomiting.  The pain acutely worsened today.  She has been having diarrhea throughout the week and today there was a small amount of blood and so she was sent to the ED for further evaluation.    The patient is currently at  [facility] for rehabilitation of her cocaine use.  She endorses a history of pancreatitis in January.  She denies any alcohol use.  She denies any cocaine use in the last week.      History provided by:  Patient        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    Depression     Endometriosis 2011    Fibromyalgia     Hypothyroid     12/14 denies     Migraines     Seizures     last 03/2017 in context abrupt withdrawl from medication    SVT (supraventricular tachycardia) 2013        Patient Active Problem List   Diagnosis Code    Calculi, ureter N20.1    GERD (gastroesophageal reflux disease) K21.9    Hypothyroid E03.9    Bronchitis J40    Pneumonia J18.9    Anxiety F41.9    Depression F32.A    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    Renal calculi N20.0    SVT (supraventricular tachycardia) I47.1    Fibromyalgia M79.7    Positive ANA (antinuclear antibody) R76.8    Major depressive disorder, recurrent, moderate F33.1    PTSD (post-traumatic stress disorder) F43.10            Past Surgical History:   Procedure Laterality Date    APPENDECTOMY      BUNIONECTOMY       COLONOSCOPY      lithotripsy       Family History   Problem Relation Age of Onset    Anxiety disorder Mother     Arthritis Mother     Cancer Mother     Depression Mother     Emphysema Mother     Arthritis Father           Social History     Tobacco Use    Smoking status: Current Every Day Smoker     Packs/day: 0.50     Years: 5.00     Pack years: 2.50     Types: Vaping, Cigarettes    Smokeless tobacco: Never Used   Substance Use Topics    Alcohol use: Not Currently    Drug use: Not Currently     Types: Marijuana, Cocaine     Comment:  clean from crack cocaine 07/07/19     Living Situation     Questions Responses    Patient lives with Other(comment)  Comment: Reports being homeless     Homeless Yes    Caregiver for other family member No    External Services Mental Health Services    Comment: FLACRA     Employment Unemployed    Comment: [employer]      Domestic Violence Risk No                Review of Systems   Review of Systems   Constitutional: Negative for fever.   Respiratory: Negative for shortness of breath and wheezing.    Cardiovascular: Negative for chest pain.   Gastrointestinal: Positive for abdominal pain, blood in stool, diarrhea and nausea. Negative for vomiting.   Skin: Negative for rash and wound.   Allergic/Immunologic: Negative for immunocompromised state.   Neurological: Negative for dizziness, syncope, weakness, numbness and headaches.   Psychiatric/Behavioral: Negative for confusion.       Physical Exam     Triage Vitals  Triage Start: Start, (04/29/21 1110)   First Recorded BP: (!) 138/100, Resp: 16, Temp: 36.5 C (97.7 F), Temp src: TEMPORAL Oxygen Therapy SpO2: 98 %, Oximetry Source: Rt Hand, O2 Device: None (Room air), Heart Rate: 104, (04/29/21 1111)  .  First Pain Reported  0-10 Scale: 10, Pain Location/Orientation: Abdomen, (04/29/21 1111)       Physical Exam  Vitals and nursing note reviewed.   Constitutional:       General: She is in acute distress.      Appearance:  Normal appearance. She is well-developed.   HENT:      Head: Normocephalic and atraumatic.   Eyes:      Extraocular Movements: Extraocular movements intact.   Cardiovascular:      Rate and Rhythm: Normal rate and regular rhythm.      Heart sounds: Normal heart sounds. No murmur heard.  Pulmonary:      Effort: Pulmonary effort is normal. No respiratory distress.      Breath sounds: Normal breath sounds. No wheezing.   Abdominal:      General: Bowel sounds are normal. There is no distension.      Palpations: Abdomen is soft.      Tenderness: There is abdominal tenderness (Exquisitely tender to palpation of the right upper quadrant and epigastric area.  Palpation of the rest of the abdomen causes referred pain to this area.).   Musculoskeletal:         General: Normal range of motion.   Neurological:      General: No focal deficit present.      Mental Status: She is alert and oriented to person, place, and time. Mental status is at baseline.      Cranial Nerves: No cranial nerve deficit.   Psychiatric:         Mood and Affect: Mood normal. Mood is not anxious or depressed.         Behavior: Behavior normal.         Thought Content: Thought content normal.         Judgment: Judgment normal.         Medical Decision Making   Patient seen by me on:  04/29/2021    Assessment:  Patient is a 30 year old female who presents emergency department for abdominal pain.  Patient's symptoms have been relatively mild over the last week but they have significantly worsened today and so she presents to the emergency department.  Very tender to palpation over the abdomen and so patient will be provided with pain medication.  Will be evaluated for possible infectious etiologies including Salmonella.  Patient denies alcohol use but will be evaluated for pancreatitis given her severe pain and the location of the pain.  Patient will be treated for pain if it is not improving CT scan will be ordered to assess for other possible etiology.   Ultrasound will also be ordered to assess for possible biliary pathology    Differential diagnosis:  Biliary colic, gastritis, GERD, pancreatitis, infectious diarrhea    Plan:  Orders Placed This Encounter      Salmonella PCR      Shigella PCR      Campylobacter PCR      Shiga toxin PCR      US abdomen limited single quad      Hold extra urine      CBC and differential      Comprehensive metabolic panel      Lipase      Insert peripheral IV      sodium chloride 0.9 % bolus 1,000 mL      HYDROmorphone PF (DILAUDID) injection 0.5 mg      promethazine (PHENERGAN) 25 mg in sodium chloride 0.9% 25 mL IVPB      famotidine (PEPCID) 20 mg/2 mL injection 20 mg    Independent review of: Existing labs, CT scans, chart/prior records    ED Course and Disposition:    Update:  Ultrasound was negative but the patient continued to have severe pain and so an abdominal CT was ordered.  This was also negative for emergent pathology.  The patient's pain persisted but was improved with pain medication.  Infectious work-up has been sent but will not come back today.  Patient has not vomited for quite a while and so is now safe for discharge back to her rehab center.              Luella Cook, MD          Luella Cook, MD  04/30/21 1816

## 2021-04-29 NOTE — Discharge Instructions (Addendum)
You were seen in [facility] Emergency Department for abdominal pain.  Your workup was extensive but did not reveal a cause of your abdominal pain.  Unfortunately, the cause of many cases of abdominal pain are not discovered in the emergency department. We recommend you follow-up with your primary care physician for further workup/management.    If you develop any worsening symptoms, such as high fevers, inability to eat or drink, changes in your mental status, or any other concerning symptoms, we suggest returning to your primary care physician or our Emergency Department for additional evaluation.    Please take Pepcid, 20 mg, twice per day for at least the next 30 days.

## 2021-04-29 NOTE — ED Notes (Signed)
Pt to ED with c/o bilateral upper abd pain that started a week ago. Endorses nausea and "6-7 episodes" of diarrhea "almost every day." today states had BRB in her stool with "strings of blood." denies use of anticoagulant. Denies emesis, fevers, urinary changes. Reports she has "intense pain after eating." reports hx of pancreatitis. Pt staying at [facility] currently.

## 2021-04-29 NOTE — ED Triage Notes (Signed)
states developed abd pain 9/30, diarrhea x 1 week. This morning diarrhea had blood in it.  Pt last used cocaine 1 week ago, has been at [facility] x 1 week. Sent to ER to r/o pancreatitis            Prehospital medications given: Yes  Other: motrin at 0730

## 2021-04-30 ENCOUNTER — Encounter: Payer: Self-pay | Admitting: Emergency Medicine

## 2021-04-30 ENCOUNTER — Emergency Department
Admission: EM | Admit: 2021-04-30 | Discharge: 2021-04-30 | Payer: Medicaid Other | Source: Ambulatory Visit | Attending: Emergency Medicine | Admitting: Emergency Medicine

## 2021-04-30 DIAGNOSIS — F17210 Nicotine dependence, cigarettes, uncomplicated: Secondary | ICD-10-CM | POA: Insufficient documentation

## 2021-04-30 DIAGNOSIS — R1084 Generalized abdominal pain: Secondary | ICD-10-CM | POA: Insufficient documentation

## 2021-04-30 DIAGNOSIS — R000 Tachycardia, unspecified: Secondary | ICD-10-CM

## 2021-04-30 DIAGNOSIS — F17290 Nicotine dependence, other tobacco product, uncomplicated: Secondary | ICD-10-CM | POA: Insufficient documentation

## 2021-04-30 DIAGNOSIS — R109 Unspecified abdominal pain: Secondary | ICD-10-CM

## 2021-04-30 LAB — COMPREHENSIVE METABOLIC PANEL
ALT: 18 U/L (ref 0–35)
AST: 18 U/L (ref 0–35)
Albumin: 3.8 g/dL (ref 3.5–5.2)
Alk Phos: 102 U/L (ref 35–105)
Anion Gap: 13 (ref 7–16)
Bilirubin,Total: 0.3 mg/dL (ref 0.0–1.2)
CO2: 23 mmol/L (ref 20–28)
Calcium: 9.2 mg/dL (ref 8.8–10.2)
Chloride: 102 mmol/L (ref 96–108)
Creatinine: 0.58 mg/dL (ref 0.51–0.95)
Glucose: 126 mg/dL — ABNORMAL HIGH (ref 60–99)
Lab: 6 mg/dL (ref 6–20)
Potassium: 3.6 mmol/L (ref 3.4–4.7)
Sodium: 138 mmol/L (ref 133–145)
Total Protein: 7.4 g/dL (ref 6.3–7.7)
eGFR BY CREAT: 125 *

## 2021-04-30 LAB — CBC AND DIFFERENTIAL
Baso # K/uL: 0 10*3/uL (ref 0.0–0.1)
Basophil %: 0.2 %
Eos # K/uL: 0.1 10*3/uL (ref 0.0–0.4)
Eosinophil %: 1.1 %
Hematocrit: 37 % (ref 34–45)
Hemoglobin: 12.8 g/dL (ref 11.2–15.7)
IMM Granulocytes #: 0 10*3/uL (ref 0.0–0.0)
IMM Granulocytes: 0.2 %
Lymph # K/uL: 2.4 10*3/uL (ref 1.2–3.7)
Lymphocyte %: 29.1 %
MCH: 31 pg (ref 26–32)
MCHC: 34 g/dL (ref 32–36)
MCV: 90 fL (ref 79–95)
Mono # K/uL: 0.5 10*3/uL (ref 0.2–0.9)
Monocyte %: 6.1 %
Neut # K/uL: 5.3 10*3/uL (ref 1.6–6.1)
Nucl RBC # K/uL: 0 10*3/uL (ref 0.0–0.0)
Nucl RBC %: 0 /100 WBC (ref 0.0–0.2)
Platelets: 239 10*3/uL (ref 160–370)
RBC: 4.2 MIL/uL (ref 3.9–5.2)
RDW: 13.2 % (ref 11.7–14.4)
Seg Neut %: 63.3 %
WBC: 8.3 10*3/uL (ref 4.0–10.0)

## 2021-04-30 MED ORDER — HALOPERIDOL LACTATE 5 MG/ML IJ SOLN *I*
2.0000 mg | Freq: Once | INTRAMUSCULAR | Status: AC
Start: 2021-04-30 — End: 2021-04-30
  Administered 2021-04-30: 2 mg via INTRAVENOUS
  Filled 2021-04-30: qty 1

## 2021-04-30 MED ORDER — ACETAMINOPHEN 500 MG PO TABS *I*
1000.0000 mg | ORAL_TABLET | Freq: Once | ORAL | Status: DC
Start: 2021-04-30 — End: 2021-05-01

## 2021-04-30 MED ORDER — PANTOPRAZOLE SODIUM 40 MG IV SOLR *I*
40.0000 mg | Freq: Once | INTRAVENOUS | Status: AC
Start: 2021-04-30 — End: 2021-04-30
  Administered 2021-04-30: 40 mg via INTRAVENOUS
  Filled 2021-04-30: qty 10

## 2021-04-30 MED ORDER — KETOROLAC TROMETHAMINE 15 MG/ML IJ SOLN *I*
15.0000 mg | Freq: Once | INTRAMUSCULAR | Status: DC
Start: 2021-04-30 — End: 2021-05-01

## 2021-04-30 NOTE — Discharge Instructions (Addendum)
You were seen in [facility] Emergency Department for abdominal pain.  Your workup yesterday was extensive but did not reveal a cause of your abdominal pain.  You have elected to be discharged.  Please return if you are unable to eat or drink or develop any new or concerning symptoms.

## 2021-04-30 NOTE — ED Provider Notes (Signed)
History     Chief Complaint   Patient presents with    Abdominal Pain    Emesis     Casey Hughes is a 30 y.o. female with a PMHx significant for Borderline personality disorder, endometriosis, fibromyalgia, substance abuse, hypothyroidism, CKD who presents to the ED with a chief complaint of abdominal pain.  Patient was seen by me last night for epigastric abdominal pain with diarrhea.  Evaluation was negative including a CT of the abdomen and pelvis, evaluation for biliary pathology with an ultrasound, and evaluation for pancreatitis.  Patient's pain improved and she did not have any vomiting and so she was discharged.  She states that she did not eat anything today and did not have recurrence of her pain until she tried to eat some cheese pizza at 5 PM.  She had sudden onset of severe abdominal pain with nausea but no vomiting.  She also endorses that she has continued to have diarrhea and has now become very dark (black).  No pain with defecation.  No cough, chest pain, fevers, chills, known sick exposures.      History provided by:  Patient        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    Depression     Endometriosis 2011    Fibromyalgia     Hypothyroid     12/14 denies     Migraines     Seizures     last 03/2017 in context abrupt withdrawl from medication    SVT (supraventricular tachycardia) 2013        Patient Active Problem List   Diagnosis Code    Calculi, ureter N20.1    GERD (gastroesophageal reflux disease) K21.9    Hypothyroid E03.9    Bronchitis J40    Pneumonia J18.9    Anxiety F41.9    Depression F32.A    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    Renal calculi N20.0    SVT (supraventricular tachycardia) I47.1    Fibromyalgia M79.7    Positive ANA (antinuclear antibody) R76.8    Major depressive disorder, recurrent, moderate F33.1     PTSD (post-traumatic stress disorder) F43.10            Past Surgical History:   Procedure Laterality Date    APPENDECTOMY      BUNIONECTOMY      COLONOSCOPY      lithotripsy       Family History   Problem Relation Age of Onset    Anxiety disorder Mother     Arthritis Mother     Cancer Mother     Depression Mother     Emphysema Mother     Arthritis Father           Social History     Tobacco Use    Smoking status: Current Every Day Smoker     Packs/day: 0.50     Years: 5.00     Pack years: 2.50     Types: Vaping, Cigarettes    Smokeless tobacco: Never Used   Substance Use Topics    Alcohol use: Not Currently    Drug use: Not Currently     Types: Marijuana, Cocaine     Comment:  clean from crack cocaine 07/07/19  Living Situation     Questions Responses    Patient lives with Other(comment)    Comment: Reports being homeless     Homeless Yes    Caregiver for other family member No    External Services Mental Health Services    Comment: FLACRA     Employment Unemployed    Comment: [employer]      Domestic Violence Risk No                Review of Systems   Review of Systems   Constitutional: Negative for fever.   HENT: Negative for congestion.    Respiratory: Negative for shortness of breath and wheezing.    Cardiovascular: Negative for chest pain.   Gastrointestinal: Positive for abdominal pain, blood in stool, diarrhea and nausea. Negative for vomiting.   Genitourinary: Negative for difficulty urinating and dysuria.   Musculoskeletal: Negative for neck pain.   Skin: Negative for rash and wound.   Neurological: Negative for dizziness, syncope, weakness, numbness and headaches.   Psychiatric/Behavioral: Negative for confusion.       Physical Exam     Triage Vitals  Triage Start: Start, (04/30/21 1812)   First Recorded BP: (!) 154/100, Temp: 36.2 C (97.2 F), Temp src: TEMPORAL Oxygen Therapy SpO2: 96 %, Oximetry Source: Rt Hand, O2 Device: None (Room air), Heart Rate: (!) 118, (04/30/21 1815)  .  First  Pain Reported  0-10 Scale: 8, Pain Location/Orientation: Abdomen, Pain Descriptors: Lambert Mody;Stabbing, (04/30/21 1815)       Physical Exam  Vitals and nursing note reviewed.   Constitutional:       General: She is in acute distress.      Appearance: Normal appearance. She is well-developed.   HENT:      Head: Normocephalic and atraumatic.   Eyes:      Extraocular Movements: Extraocular movements intact.   Cardiovascular:      Rate and Rhythm: Normal rate and regular rhythm.      Heart sounds: Normal heart sounds. No murmur heard.  Pulmonary:      Effort: Pulmonary effort is normal. No respiratory distress.      Breath sounds: Normal breath sounds. No wheezing.   Abdominal:      General: Bowel sounds are normal. There is no distension.      Palpations: Abdomen is soft.      Tenderness: There is abdominal tenderness (Mildly tender throughout, particularly in the epigastrium).   Musculoskeletal:         General: Normal range of motion.   Neurological:      General: No focal deficit present.      Mental Status: She is alert and oriented to person, place, and time. Mental status is at baseline.      Cranial Nerves: No cranial nerve deficit.   Psychiatric:         Mood and Affect: Mood normal.         Behavior: Behavior normal.         Thought Content: Thought content normal.         Judgment: Judgment normal.         Medical Decision Making   Patient seen by me on:  04/30/2021    Assessment:  Patient is a 30 year old female presents emergency department for abdominal pain.  Patient presented with similar symptoms yesterday and had a thorough evaluation including a CT scan which was negative for any acute abnormalities.  Patient's symptoms improved and so she was discharged.  She returns with similar symptoms.  Given the very thorough evaluation last night I do not believe recurrent imaging is indicated.  She will be screened again for possible UTI as she was unable to provide a urinalysis yesterday.  She will be treated for  her pain but this time will not be provided with opiates.  We will trial Haldol.  Patient has a history of substance abuse but reports that she is a cocaine user and so initially there is not suspicion for malingering.  However given the lack of organic causes this has to at least be considered.  Patient does report black stools and so a rectal exam will be performed with guaiac.  H&H will be trended compared to yesterday.    Differential diagnosis:  Fibromyalgia, gastritis, gastric ulcer, secondary gain, urinary tract infection    Plan:  Orders Placed This Encounter      Urinalysis reflex to culture      Urinalysis with reflex to Microscopic UA and reflex to Bacterial Culture      CBC and differential      Comprehensive metabolic panel      EKG 12 lead (initial)      Insert peripheral IV      haloperidol lactate (HALDOL) injection 2 mg      pantoprazole (PROTONIX) 4 mg/ml injection 40 mg    Independent review of: Existing labs, chart/prior records    ED Course and Disposition:    Update:  Patient received Haldol and then stated "if I do not get pain meds I am going to leave".  I had already informed the patient that she would not be receiving opiate pain medication and that Haldol is an appropriate substitute.  Patient was offered Toradol and Tylenol but refused and requested discharge.  Blood work is negative and does not show any signs of significant bleeding.  CMP is unremarkable.  Patient is vital signs stable.   Therefore, I do not have concern for emergent pathology and so the patient will be discharged and not made to sign out AMA.              Luella Cook, MD          Luella Cook, MD  05/02/21 [phone]

## 2021-04-30 NOTE — ED Notes (Signed)
Report given to Mary RN.

## 2021-04-30 NOTE — ED Triage Notes (Signed)
Pt to Emergency with c/o abdominal pain and nausea for the past week, states it has become worse in the past few days after meals.  States she has been having loose stools.           Prehospital medications given: No  Other: motrin at 0730

## 2021-05-01 LAB — URINALYSIS REFLEX TO CULTURE
Blood,UA: NEGATIVE
Glucose,UA: NEGATIVE
Ketones, UA: NEGATIVE
Leuk Esterase,UA: NEGATIVE
Nitrite,UA: NEGATIVE
Protein,UA: NEGATIVE
Specific Gravity,UA: 1.01 (ref 1.002–1.030)
pH,UA: 6 (ref 5.0–8.0)

## 2021-05-03 LAB — EKG 12-LEAD
P: 49 deg
PR: 151 ms
QRS: 87 deg
QRSD: 95 ms
QT: 341 ms
QTc: 471 ms
Rate: 115 {beats}/min
T: 96 deg

## 2021-05-03 LAB — SALMONELLA PCR

## 2021-05-03 LAB — SHIGELLA PCR

## 2021-05-03 LAB — SHIGA TOXIN PCR

## 2021-05-03 LAB — CAMPYLOBACTER PCR

## 2021-05-04 ENCOUNTER — Other Ambulatory Visit
Admission: RE | Admit: 2021-05-04 | Discharge: 2021-05-04 | Disposition: A | Discharge status: Home / Self Care | Payer: Medicaid Other | Source: Ambulatory Visit | Attending: Family Medicine | Admitting: Family Medicine

## 2021-05-04 DIAGNOSIS — F1420 Cocaine dependence, uncomplicated: Secondary | ICD-10-CM | POA: Insufficient documentation

## 2021-05-04 LAB — COMPREHENSIVE METABOLIC PANEL
ALT: 17 U/L (ref 0–35)
AST: 18 U/L (ref 0–35)
Albumin: 3.7 g/dL (ref 3.5–5.2)
Alk Phos: 114 U/L — ABNORMAL HIGH (ref 35–105)
Anion Gap: 11 (ref 7–16)
Bilirubin,Total: 0.2 mg/dL (ref 0.0–1.2)
CO2: 23 mmol/L (ref 20–28)
Calcium: 9.1 mg/dL (ref 8.8–10.2)
Chloride: 102 mmol/L (ref 96–108)
Creatinine: 0.59 mg/dL (ref 0.51–0.95)
Glucose: 109 mg/dL — ABNORMAL HIGH (ref 60–99)
Lab: 8 mg/dL (ref 6–20)
Potassium: 3.9 mmol/L (ref 3.4–4.7)
Sodium: 136 mmol/L (ref 133–145)
Total Protein: 7.6 g/dL (ref 6.3–7.7)
eGFR BY CREAT: 125 *

## 2021-05-04 LAB — CBC
Hematocrit: 39 % (ref 34–45)
Hemoglobin: 13.3 g/dL (ref 11.2–15.7)
MCH: 31 pg (ref 26–32)
MCHC: 34 g/dL (ref 32–36)
MCV: 91 fL (ref 79–95)
Platelets: 252 10*3/uL (ref 160–370)
RBC: 4.3 MIL/uL (ref 3.9–5.2)
RDW: 13.4 % (ref 11.7–14.4)
WBC: 8.7 10*3/uL (ref 4.0–10.0)

## 2021-05-05 LAB — SYPHILIS SCREEN
Syphilis Screen: NEGATIVE
Syphilis Status: NONREACTIVE

## 2021-05-05 LAB — AEROBIC CULTURE: Aerobic Culture: 0

## 2021-05-07 ENCOUNTER — Emergency Department
Admission: EM | Admit: 2021-05-07 | Discharge: 2021-05-07 | Payer: Medicaid Other | Source: Ambulatory Visit | Attending: Family Medicine | Admitting: Family Medicine

## 2021-05-07 ENCOUNTER — Other Ambulatory Visit: Payer: Self-pay

## 2021-05-07 DIAGNOSIS — G8929 Other chronic pain: Secondary | ICD-10-CM | POA: Insufficient documentation

## 2021-05-07 DIAGNOSIS — F1511 Other stimulant abuse, in remission: Secondary | ICD-10-CM

## 2021-05-07 DIAGNOSIS — M542 Cervicalgia: Secondary | ICD-10-CM | POA: Insufficient documentation

## 2021-05-07 DIAGNOSIS — M545 Low back pain, unspecified: Secondary | ICD-10-CM | POA: Insufficient documentation

## 2021-05-07 MED ORDER — DIAZEPAM 5 MG PO TABS *I*
10.0000 mg | ORAL_TABLET | Freq: Once | ORAL | Status: AC
Start: 2021-05-07 — End: 2021-05-07
  Administered 2021-05-07: 10 mg via ORAL
  Filled 2021-05-07: qty 2

## 2021-05-07 MED ORDER — KETOROLAC TROMETHAMINE 30 MG/ML IJ SOLN *I*
30.0000 mg | Freq: Once | INTRAMUSCULAR | Status: AC
Start: 2021-05-07 — End: 2021-05-07
  Administered 2021-05-07: 30 mg via INTRAMUSCULAR
  Filled 2021-05-07: qty 1

## 2021-05-07 NOTE — Discharge Instructions (Signed)
I am sorry were not able to make a better impression on your back pain but it is has been going on for a long time.    We will give you the details of the local pain clinic.  I am sure they have an associated clinic in [place].  Please set up with either the local 1 other [place] clinic depending on logistics of your location.    You may return anytime if problems.    [facility] HERE AND [facility]   the number is   [phone]

## 2021-05-07 NOTE — ED Notes (Signed)
[facility] called to aware staff of pts return to facility. Staff will arrange ride for pt.
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
## 2021-05-07 NOTE — ED Triage Notes (Signed)
Pt to ER sent from [facility]. Pt states she feels depressed due to current relationship with boyfriend. Pt states she was having thoughts of self harm while at [facility]. Scratch like abrasions noted to Left Wrist. Pt denies any current SI/HI.

## 2021-05-10 ENCOUNTER — Emergency Department: Payer: Medicaid Other

## 2021-05-10 ENCOUNTER — Emergency Department
Admission: EM | Admit: 2021-05-10 | Discharge: 2021-05-10 | Disposition: A | Discharge status: Home / Self Care | Payer: Medicaid Other | Source: Ambulatory Visit | Attending: Emergency Medicine | Admitting: Emergency Medicine

## 2021-05-10 ENCOUNTER — Ambulatory Visit: Payer: Medicaid Other | Admitting: Obstetrics and Gynecology

## 2021-05-10 ENCOUNTER — Encounter: Payer: Self-pay | Admitting: Gastroenterology

## 2021-05-10 DIAGNOSIS — R918 Other nonspecific abnormal finding of lung field: Secondary | ICD-10-CM

## 2021-05-10 DIAGNOSIS — R002 Palpitations: Secondary | ICD-10-CM | POA: Insufficient documentation

## 2021-05-10 DIAGNOSIS — N189 Chronic kidney disease, unspecified: Secondary | ICD-10-CM | POA: Insufficient documentation

## 2021-05-10 DIAGNOSIS — F17210 Nicotine dependence, cigarettes, uncomplicated: Secondary | ICD-10-CM | POA: Insufficient documentation

## 2021-05-10 DIAGNOSIS — R9431 Abnormal electrocardiogram [ECG] [EKG]: Secondary | ICD-10-CM

## 2021-05-10 DIAGNOSIS — Z8679 Personal history of other diseases of the circulatory system: Secondary | ICD-10-CM | POA: Insufficient documentation

## 2021-05-10 DIAGNOSIS — F1411 Cocaine abuse, in remission: Secondary | ICD-10-CM | POA: Insufficient documentation

## 2021-05-10 DIAGNOSIS — F1490 Cocaine use, unspecified, uncomplicated: Secondary | ICD-10-CM

## 2021-05-10 DIAGNOSIS — R079 Chest pain, unspecified: Secondary | ICD-10-CM

## 2021-05-10 DIAGNOSIS — F17290 Nicotine dependence, other tobacco product, uncomplicated: Secondary | ICD-10-CM | POA: Insufficient documentation

## 2021-05-10 DIAGNOSIS — R000 Tachycardia, unspecified: Secondary | ICD-10-CM | POA: Insufficient documentation

## 2021-05-10 DIAGNOSIS — E669 Obesity, unspecified: Secondary | ICD-10-CM | POA: Insufficient documentation

## 2021-05-10 DIAGNOSIS — Z20822 Contact with and (suspected) exposure to covid-19: Secondary | ICD-10-CM | POA: Insufficient documentation

## 2021-05-10 LAB — BASIC METABOLIC PANEL
Anion Gap: 12 (ref 7–16)
CO2: 23 mmol/L (ref 20–28)
Calcium: 9.1 mg/dL (ref 8.8–10.2)
Chloride: 103 mmol/L (ref 96–108)
Creatinine: 0.58 mg/dL (ref 0.51–0.95)
Glucose: 79 mg/dL (ref 60–99)
Lab: 10 mg/dL (ref 6–20)
Sodium: 138 mmol/L (ref 133–145)
eGFR BY CREAT: 125 *

## 2021-05-10 LAB — CBC AND DIFFERENTIAL
Baso # K/uL: 0.1 10*3/uL (ref 0.0–0.1)
Basophil %: 0.4 %
Eos # K/uL: 0.3 10*3/uL (ref 0.0–0.4)
Eosinophil %: 2.5 %
Hematocrit: 37 % (ref 34–45)
Hemoglobin: 12.6 g/dL (ref 11.2–15.7)
IMM Granulocytes #: 0 10*3/uL (ref 0.0–0.0)
IMM Granulocytes: 0.3 %
Lymph # K/uL: 2.8 10*3/uL (ref 1.2–3.7)
Lymphocyte %: 24.3 %
MCH: 31 pg (ref 26–32)
MCHC: 34 g/dL (ref 32–36)
MCV: 90 fL (ref 79–95)
Mono # K/uL: 0.8 10*3/uL (ref 0.2–0.9)
Monocyte %: 6.5 %
Neut # K/uL: 7.6 10*3/uL — ABNORMAL HIGH (ref 1.6–6.1)
Nucl RBC # K/uL: 0 10*3/uL (ref 0.0–0.0)
Nucl RBC %: 0 /100 WBC (ref 0.0–0.2)
Platelets: 270 10*3/uL (ref 160–370)
RBC: 4.1 MIL/uL (ref 3.9–5.2)
RDW: 13.3 % (ref 11.7–14.4)
Seg Neut %: 66 %
WBC: 11.6 10*3/uL — ABNORMAL HIGH (ref 4.0–10.0)

## 2021-05-10 LAB — TROPONIN T 0 HR HIGH SENSITIVITY (IP/ED ONLY)

## 2021-05-10 LAB — EKG 12-LEAD
P: 10 deg
PR: 153 ms
QRS: 83 deg
QRSD: 90 ms
QT: 355 ms
QTc: 461 ms
Rate: 101 {beats}/min
T: 107 deg

## 2021-05-10 LAB — PROTIME-INR
INR: 1 (ref 0.9–1.1)
Protime: 11 s (ref 10.0–12.9)

## 2021-05-10 MED ORDER — ASPIRIN 81 MG PO CHEW *I*
324.0000 mg | CHEWABLE_TABLET | Freq: Once | ORAL | Status: AC
Start: 2021-05-10 — End: 2021-05-10
  Administered 2021-05-10: 324 mg via ORAL
  Filled 2021-05-10: qty 4

## 2021-05-10 NOTE — ED Provider Notes (Signed)
History     Chief Complaint   Patient presents with    Palpitations     Casey Hughes is a 30 y.o. female with history of anxiety, fibromyalgia, endometriosis, depression, obesity, borderline personality disorder, CKD, positive ANA, smoking, cocaine abuse, and SVT presents with palpitations.  States it feels similar to prior episodes of SVT.  This episode started at 7 PM tonight with no precipitating activities or ingestions.  Triage EKG shows sinus tachycardia.  Denies any chest pain, shortness of breath, cough, lightheadedness, dizziness, or syncope.  Denies any recent use of drugs or other medications other than those prescribed.  Is not on an antidysrhythmic.  Denies any fevers, chills, or sweats.  Denies any abdominal pain.  Has not had rhinorrhea, congestion, headache, throat ache, or sick contacts.  Denies any leg swelling.  No history of blood clots.  Not on oral contraceptives.  No leg swelling.  Patient currently at a drug use rehabilitation facility and states that she has not used cocaine in a "long time"        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    Depression     Endometriosis 2011    Fibromyalgia     Hypothyroid     12/14 denies     Migraines     Seizures     last 03/2017 in context abrupt withdrawl from medication    SVT (supraventricular tachycardia) 2013        Patient Active Problem List   Diagnosis Code    Calculi, ureter N20.1    GERD (gastroesophageal reflux disease) K21.9    Hypothyroid E03.9    Bronchitis J40    Pneumonia J18.9    Anxiety F41.9    Depression F32.A    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    Renal calculi N20.0    SVT (supraventricular tachycardia) I47.1    Fibromyalgia M79.7    Positive ANA (antinuclear antibody) R76.8    Major depressive disorder, recurrent, moderate F33.1    PTSD (post-traumatic stress  disorder) F43.10            Past Surgical History:   Procedure Laterality Date    APPENDECTOMY      BUNIONECTOMY      COLONOSCOPY      lithotripsy       Family History   Problem Relation Age of Onset    Anxiety disorder Mother     Arthritis Mother     Cancer Mother     Depression Mother     Emphysema Mother     Arthritis Father           Social History     Tobacco Use    Smoking status: Current Every Day Smoker     Packs/day: 0.50     Years: 5.00     Pack years: 2.50     Types: Vaping, Cigarettes    Smokeless tobacco: Never Used   Substance Use Topics    Alcohol use: Not Currently    Drug use: Not Currently     Types: Marijuana, Cocaine     Comment:  clean from crack cocaine 07/07/19     Living Situation     Questions Responses    Patient lives with Other(comment)    Comment: Reports  being homeless     Homeless Yes    Caregiver for other family member No    External Services Mental Health Services    Comment: FLACRA     Employment Unemployed    Comment: [employer]      Domestic Violence Risk No                Review of Systems   Review of Systems   Constitutional: Negative for activity change, appetite change, chills, diaphoresis, fatigue and fever.   HENT: Negative for congestion, rhinorrhea and sore throat.    Respiratory: Negative for cough, chest tightness and shortness of breath.    Cardiovascular: Positive for palpitations. Negative for chest pain and leg swelling.   Gastrointestinal: Negative for abdominal pain, constipation, diarrhea, nausea and vomiting.   Genitourinary: Negative for difficulty urinating, dysuria and flank pain.   Musculoskeletal: Negative for arthralgias and myalgias.   Skin: Negative for pallor and rash.   Neurological: Negative for dizziness, syncope, weakness, light-headedness, numbness and headaches.   Hematological: Does not bruise/bleed easily.   Psychiatric/Behavioral: Negative for agitation and confusion. The patient is nervous/anxious.        Physical Exam     Triage  Vitals  Triage Start: Start, (05/10/21 2010)   First Recorded BP: 148/88, Resp: 18, Temp: 36 C (96.8 F) Oxygen Therapy SpO2: 99 %, Oximetry Source: Lt Hand, O2 Device: None (Room air), Heart Rate: 98, (05/10/21 2019) Heart Rate (via Pulse Ox): 98, (05/10/21 2019).  First Pain Reported  0-10 Scale: 3, Pain Location/Orientation: Back, (05/10/21 2019)       Physical Exam  Vitals and nursing note reviewed.   Constitutional:       General: She is not in acute distress.     Appearance: Normal appearance. She is normal weight. She is not ill-appearing, toxic-appearing or diaphoretic.   HENT:      Head: Normocephalic and atraumatic.      Right Ear: External ear normal.      Left Ear: External ear normal.      Nose: Nose normal.      Mouth/Throat:      Mouth: Mucous membranes are moist.      Pharynx: Oropharynx is clear.   Eyes:      Extraocular Movements: Extraocular movements intact.      Conjunctiva/sclera: Conjunctivae normal.      Pupils: Pupils are equal, round, and reactive to light.   Cardiovascular:      Rate and Rhythm: Regular rhythm. Tachycardia present.      Pulses: Normal pulses.      Heart sounds: Normal heart sounds.   Pulmonary:      Effort: Pulmonary effort is normal.      Breath sounds: Normal breath sounds.   Abdominal:      General: There is no distension.      Palpations: Abdomen is soft.      Tenderness: There is no abdominal tenderness. There is no right CVA tenderness, left CVA tenderness or guarding.   Musculoskeletal:         General: Normal range of motion.      Cervical back: Normal range of motion and neck supple.      Right lower leg: No edema.      Left lower leg: No edema.   Skin:     General: Skin is warm and dry.      Coloration: Skin is not pale.   Neurological:      Mental Status:  She is alert and oriented to person, place, and time.   Psychiatric:         Mood and Affect: Mood is anxious.         Medical Decision Making       Patient seen by me on arrival date of 05/10/2021 at at time  of arrival  8:08 PM.  Initial face to face evaluation time noted above may be discrepant due to patient acuity and delay in documentation.    Assessment:  30 y.o., female comes to the ED with concern for palpitations.  States that this started suddenly around 7 PM tonight with no precipitating factors and she is experienced numerous episodes of these over the last few years.  Initial EKG showing sinus tachycardia.  Rate returned to normal spontaneously over the course of an hour.  Monitored for few hours with stable exam.  Clear lungs, benign abdomen, normal skin tone and turgor.    Differential Diagnosis includes:  - SVT  - Cocaine ingestion  - ACS  - Other dysrhythmia  - Anxiety  - Dehydration/electrolyte derangement    Plan:   Orders Placed This Encounter    *Chest STANDARD single view    CBC and differential    Basic metabolic panel    Protime-INR    Troponin T 0 HR High Sensitivity    Troponin T Random High Sensitivity    Potassium    COVID-19 PCR    EKG 12 lead (initial)    EKG 12 lead (initial)    EKG: initial    EKG: follow up    Insert peripheral IV    aspirin chewable tablet 324 mg     Addendum: White count is slightly elevated and chest x-ray shows some atelectasis in the right lower lobe.  Patient states that she has had abnormalities in the right lower lobe of the lung for many years.  Denies any chest pain or shortness of breath or cough or fevers at this time.  EKG reassuring.  Rate returned to normal.  Patient desires to go at this time.  Discharging with return precautions and follow-up instructions.            Thayer Dallas, MD          Victoriano Lain, MD  05/10/21 [phone]

## 2021-05-10 NOTE — ED Notes (Signed)
Discharge instructions reviewed with patient all questions answered, patient verbalized understanding. Patient left ambulatory to [facility] with security and female escort.

## 2021-05-10 NOTE — Discharge Instructions (Addendum)
You are seen here today due to some palpitations without chest pain or shortness of breath.  Your EKG does not show any evidence of abnormal heart rhythms or any kind of heart events.  Your lab work is only notable for a slightly elevated white blood count.  You may be developing a viral illness.  Your chest x-ray shows a small abnormality in the right lower lung, but you reported having had that on prior chest x-rays and you are not having any shortness of breath, cough, or fevers.  I think you are safe to go home at this time.  Stay well-hydrated over the next week, continuing to drink small amounts of water throughout the day.  Eat a well-balanced diet with vegetables and proteins from meats or other sources.    Call 911 or have someone else call for any of the following:   You have any of the following signs of a heart attack:      Squeezing, pressure, or pain in your chest    You may  also have any of the following:     Discomfort or pain in your back, neck, jaw, stomach, or arm    Shortness of breath    Nausea or vomiting    Lightheadedness or a sudden cold sweat    You have any of the following signs of a stroke:      Numbness or drooping on one side of your face     Weakness in an arm or leg    Confusion or difficulty speaking    Dizziness, a severe headache, or vision loss    You faint or lose consciousness.    Return to the emergency department if:   Your palpitations happen more often or get more intense.

## 2021-05-10 NOTE — ED Triage Notes (Signed)
PATIENT REPORTS TO ER TODAY FOR CHEST PALPITATIONS. EKG DONE IN TRIAGE. PATIENT DENIES ANY CHEST PAIN AT THIS TIME. HX OF CRACK USE.            Prehospital medications given: No

## 2021-05-18 ENCOUNTER — Emergency Department
Admission: EM | Admit: 2021-05-18 | Discharge: 2021-05-18 | Discharge status: Against Medical Advice | Payer: Medicaid Other | Source: Ambulatory Visit | Attending: Family Medicine | Admitting: Family Medicine

## 2021-05-18 ENCOUNTER — Emergency Department
Admission: EM | Admit: 2021-05-18 | Discharge: 2021-05-19 | Disposition: A | Discharge status: Home / Self Care | Payer: Medicaid Other | Source: Ambulatory Visit | Attending: Emergency Medicine | Admitting: Emergency Medicine

## 2021-05-18 ENCOUNTER — Other Ambulatory Visit: Payer: Self-pay

## 2021-05-18 DIAGNOSIS — Z7251 High risk heterosexual behavior: Secondary | ICD-10-CM | POA: Insufficient documentation

## 2021-05-18 DIAGNOSIS — R109 Unspecified abdominal pain: Secondary | ICD-10-CM

## 2021-05-18 DIAGNOSIS — F17210 Nicotine dependence, cigarettes, uncomplicated: Secondary | ICD-10-CM | POA: Insufficient documentation

## 2021-05-18 DIAGNOSIS — N760 Acute vaginitis: Secondary | ICD-10-CM | POA: Insufficient documentation

## 2021-05-18 DIAGNOSIS — Z789 Other specified health status: Secondary | ICD-10-CM

## 2021-05-18 DIAGNOSIS — R079 Chest pain, unspecified: Secondary | ICD-10-CM | POA: Insufficient documentation

## 2021-05-18 DIAGNOSIS — F17290 Nicotine dependence, other tobacco product, uncomplicated: Secondary | ICD-10-CM | POA: Insufficient documentation

## 2021-05-18 DIAGNOSIS — Z5321 Procedure and treatment not carried out due to patient leaving prior to being seen by health care provider: Secondary | ICD-10-CM | POA: Insufficient documentation

## 2021-05-18 DIAGNOSIS — B9689 Other specified bacterial agents as the cause of diseases classified elsewhere: Secondary | ICD-10-CM | POA: Insufficient documentation

## 2021-05-18 DIAGNOSIS — R1011 Right upper quadrant pain: Secondary | ICD-10-CM | POA: Insufficient documentation

## 2021-05-18 LAB — CBC AND DIFFERENTIAL
Baso # K/uL: 0 10*3/uL (ref 0.0–0.1)
Basophil %: 0.3 %
Eos # K/uL: 0.2 10*3/uL (ref 0.0–0.4)
Eosinophil %: 2.1 %
Hematocrit: 37 % (ref 34–45)
Hemoglobin: 12.6 g/dL (ref 11.2–15.7)
IMM Granulocytes #: 0.1 10*3/uL — ABNORMAL HIGH (ref 0.0–0.0)
IMM Granulocytes: 0.6 %
Lymph # K/uL: 2.9 10*3/uL (ref 1.2–3.7)
Lymphocyte %: 26.6 %
MCH: 31 pg (ref 26–32)
MCHC: 34 g/dL (ref 32–36)
MCV: 91 fL (ref 79–95)
Mono # K/uL: 0.7 10*3/uL (ref 0.2–0.9)
Monocyte %: 6 %
Neut # K/uL: 7 10*3/uL — ABNORMAL HIGH (ref 1.6–6.1)
Nucl RBC # K/uL: 0 10*3/uL (ref 0.0–0.0)
Nucl RBC %: 0 /100 WBC (ref 0.0–0.2)
Platelets: 256 10*3/uL (ref 160–370)
RBC: 4.1 MIL/uL (ref 3.9–5.2)
RDW: 13.2 % (ref 11.7–14.4)
Seg Neut %: 64.4 %
WBC: 10.9 10*3/uL — ABNORMAL HIGH (ref 4.0–10.0)

## 2021-05-18 LAB — URINALYSIS REFLEX TO CULTURE
Blood,UA: NEGATIVE
Glucose,UA: NEGATIVE
Ketones, UA: NEGATIVE
Nitrite,UA: NEGATIVE
Protein,UA: NEGATIVE
Specific Gravity,UA: 1.01 (ref 1.002–1.030)
pH,UA: 7.5 (ref 5.0–8.0)

## 2021-05-18 LAB — COMPREHENSIVE METABOLIC PANEL
ALT: 20 U/L (ref 0–35)
AST: 21 U/L (ref 0–35)
Albumin: 3.8 g/dL (ref 3.5–5.2)
Alk Phos: 109 U/L — ABNORMAL HIGH (ref 35–105)
Anion Gap: 11 (ref 7–16)
Bilirubin,Total: 0.3 mg/dL (ref 0.0–1.2)
CO2: 26 mmol/L (ref 20–28)
Calcium: 9.2 mg/dL (ref 8.8–10.2)
Chloride: 100 mmol/L (ref 96–108)
Creatinine: 0.6 mg/dL (ref 0.51–0.95)
Glucose: 89 mg/dL (ref 60–99)
Lab: 8 mg/dL (ref 6–20)
Potassium: 4 mmol/L (ref 3.4–4.7)
Sodium: 137 mmol/L (ref 133–145)
Total Protein: 7.5 g/dL (ref 6.3–7.7)
eGFR BY CREAT: 124 *

## 2021-05-18 LAB — EKG 12-LEAD
P: -7 deg
PR: 148 ms
QRS: 85 deg
QRSD: 94 ms
QT: 372 ms
QTc: 450 ms
Rate: 88 {beats}/min
T: 107 deg

## 2021-05-18 LAB — URINE MICROSCOPIC (IQ200)

## 2021-05-18 LAB — PREGNANCY, URINE: Preg Test,UR: NEGATIVE

## 2021-05-18 MED ORDER — CLONAZEPAM 0.5 MG PO TABS *I*
0.5000 mg | ORAL_TABLET | Freq: Once | ORAL | Status: AC
Start: 2021-05-18 — End: 2021-05-18
  Administered 2021-05-18: 0.5 mg via ORAL
  Filled 2021-05-18: qty 1

## 2021-05-18 MED ORDER — AZITHROMYCIN 250 MG PO TABS *I*
1000.0000 mg | ORAL_TABLET | Freq: Once | ORAL | Status: AC
Start: 2021-05-19 — End: 2021-05-19
  Administered 2021-05-19: 1000 mg via ORAL
  Filled 2021-05-18: qty 4

## 2021-05-18 MED ORDER — KETOROLAC TROMETHAMINE 15 MG/ML IJ SOLN *I*
15.0000 mg | Freq: Once | INTRAMUSCULAR | Status: AC
Start: 2021-05-19 — End: 2021-05-19
  Administered 2021-05-19: 15 mg via INTRAVENOUS
  Filled 2021-05-18: qty 1

## 2021-05-18 MED ORDER — FLUCONAZOLE 150 MG PO TABS *I*
150.0000 mg | ORAL_TABLET | Freq: Once | ORAL | Status: AC
Start: 2021-05-19 — End: 2021-05-19
  Administered 2021-05-19: 150 mg via ORAL
  Filled 2021-05-18: qty 1

## 2021-05-18 MED ORDER — ACETAMINOPHEN 500 MG PO TABS *I*
1000.0000 mg | ORAL_TABLET | Freq: Once | ORAL | Status: AC
Start: 2021-05-19 — End: 2021-05-19
  Administered 2021-05-19: 1000 mg via ORAL
  Filled 2021-05-18: qty 2

## 2021-05-18 MED ORDER — LIDOCAINE HCL (PF) 1 % IJ SOLN *I*
500.0000 mg | Freq: Once | INTRAMUSCULAR | Status: AC
Start: 2021-05-19 — End: 2021-05-19
  Administered 2021-05-19: 500 mg via INTRAMUSCULAR
  Filled 2021-05-18: qty 10

## 2021-05-18 NOTE — ED Notes (Signed)
Patient sleeping at this time appears comfortable.

## 2021-05-18 NOTE — ED Triage Notes (Signed)
Pt returns to ER after previously leaving AMA with complaint of RUQ pain, suprapubic pain and vaginal odor. Pt denies chest pain at this time stating "I don't have that anymore".

## 2021-05-18 NOTE — ED Triage Notes (Signed)
Pt to ER with complaint of RUQ pain x 3 hours with nausea. Pt reports vaginal odor and concerned for BV.

## 2021-05-18 NOTE — ED Provider Notes (Signed)
History     Chief Complaint   Patient presents with    Abdominal Pain     Casey Hughes is a 30 y.o. female with a PMHx significant for borderline personality disorder, CKD, depression, fibromyalgia, hypothyroidism, SVT, substance abuse who presents to the ED with a chief complaint of right upper quadrant abdominal pain and substernal chest pain.  Patient represents emergency department for right upper quadrant abdominal pain x3 weeks.  She states the pain has not abated and so she has further concerns.  She states that several years ago she was diagnosed with "an abnormality in my right lower lung" and was kept overnight on blood thinners.  She was then retested and the "abnormality" was then gone.  She presented last week for chest pain and a chest x-ray showed a recurrent "abnormality" and so she is concerned what ever it was has now returned.  On further chart review it appears that she had atelectasis in the right lower lung.  This morning she had chest tightness which relieved with Klonopin.  That pain has now returned and is currently present.  Overall patient believes that her chest discomfort is most likely anxiety related but is concerned about the right upper quadrant/right lower chest pain.  No symptoms of DVT, recent travel, or hormone therapy.  Additionally, the patient endorses several days of vaginal discharge, foul-smelling odor, and unprotected sex with multiple individuals prior to being admitted to her rehabilitation facility.  She also endorses lower abdominal cramping.  No fevers, chills, diarrhea, nausea, vomiting.      History provided by:  Patient        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    Depression     Endometriosis 2011    Fibromyalgia     Hypothyroid     12/14 denies     Migraines     Seizures     last 03/2017 in  context abrupt withdrawl from medication    SVT (supraventricular tachycardia) 2013        Patient Active Problem List   Diagnosis Code    Calculi, ureter N20.1    GERD (gastroesophageal reflux disease) K21.9    Hypothyroid E03.9    Bronchitis J40    Pneumonia J18.9    Anxiety F41.9    Depression F32.A    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    Renal calculi N20.0    SVT (supraventricular tachycardia) I47.1    Fibromyalgia M79.7    Positive ANA (antinuclear antibody) R76.8    Major depressive disorder, recurrent, moderate F33.1    PTSD (post-traumatic stress disorder) F43.10            Past Surgical History:   Procedure Laterality Date    APPENDECTOMY      BUNIONECTOMY      COLONOSCOPY      lithotripsy       Family History   Problem Relation Age of Onset    Anxiety disorder Mother     Arthritis Mother     Cancer Mother     Depression Mother     Emphysema Mother     Arthritis Father           Social History     Tobacco Use    Smoking status: Every  Day     Packs/day: 0.50     Years: 5.00     Pack years: 2.50     Types: Vaping, Cigarettes    Smokeless tobacco: Never   Substance Use Topics    Alcohol use: Not Currently    Drug use: Not Currently     Types: Marijuana, Cocaine     Comment:  clean from crack cocaine 07/07/19     Living Situation     Questions Responses    Patient lives with Other(comment)    Comment: Reports being homeless     Homeless Yes    Caregiver for other family member No    External Services Mental Health Services    Comment: [employer]     Employment Unemployed    Comment: [employer]      Domestic Violence Risk No                Review of Systems   Review of Systems   Constitutional: Negative for fever.   Eyes: Negative for visual disturbance.   Respiratory: Positive for chest tightness. Negative for shortness of breath and wheezing.    Cardiovascular: Positive for chest pain.   Gastrointestinal: Positive for abdominal pain. Negative for diarrhea, nausea and vomiting.   Genitourinary:  Positive for vaginal discharge. Negative for dysuria, menstrual problem and vaginal pain.   Musculoskeletal: Negative for neck pain.   Skin: Negative for rash and wound.   Neurological: Negative for dizziness, syncope, weakness, numbness and headaches.   Psychiatric/Behavioral: Negative for confusion.       Physical Exam     Triage Vitals  Triage Start: Start, (05/18/21 1849)   First Recorded BP: (!) 131/98, Resp: 18, Temp src: TEMPORAL Oxygen Therapy SpO2: 98 %, Oximetry Source: Rt Hand, O2 Device: None (Room air), Heart Rate: 96, (05/18/21 1850)  .  First Pain Reported  0-10 Scale: 7, Pain Location/Orientation: Abdomen, (05/18/21 1850)       Physical Exam  Vitals and nursing note reviewed.   Constitutional:       General: She is not in acute distress.     Appearance: Normal appearance. She is well-developed.   HENT:      Head: Normocephalic and atraumatic.   Eyes:      Extraocular Movements: Extraocular movements intact.   Cardiovascular:      Rate and Rhythm: Normal rate and regular rhythm.      Heart sounds: Normal heart sounds. No murmur heard.  Pulmonary:      Effort: Pulmonary effort is normal. No respiratory distress.      Breath sounds: Normal breath sounds. No wheezing.   Abdominal:      General: Bowel sounds are normal. There is no distension.      Palpations: Abdomen is soft.      Tenderness: There is no abdominal tenderness.   Musculoskeletal:         General: Normal range of motion.   Neurological:      General: No focal deficit present.      Mental Status: She is alert and oriented to person, place, and time. Mental status is at baseline.      Cranial Nerves: No cranial nerve deficit.   Psychiatric:         Mood and Affect: Mood normal. Mood is not anxious or depressed.         Behavior: Behavior normal.         Thought Content: Thought content normal.  Judgment: Judgment normal.         Medical Decision Making   Patient seen by me on:  05/18/2021    Assessment:  The patient is a 30 year old  female who presents emergency department for right upper quadrant abdominal pain as well as vaginal symptoms including discharge and foul-smelling odor.  The right upper quadrant abdominal pain has been thoroughly evaluated including a CT scan and ultrasound over the past few weeks and so I do not believe additional imaging is indicated at this time.  On further chart review, the "abnormality" that the patient is likely referring to is atelectasis.  I believe this is most likely due to the patient's being quite overweight and likely obesity hypoventilation syndrome.  She does not have any infectious symptoms including cough or fevers and so I do not believe repeat x-ray is indicated either.  Patient will be evaluated for signs of significant abdominal abnormalities.  A swab will be collected for STIs given her high risk sexual behavior, vaginal discharge, and foul odor.  These unfortunately are send out since will not come back for quite some time and so she will be treated empirically.      Differential diagnosis:  STI, yeast infection, acute on chronic abdominal pain, anxiety, biliary pathology, pancreatitis    Plan:  Orders Placed This Encounter      Vaginitis screen: DNA probe      N. Gonorrhoeae DNA amplification      Chlamydia plasmid DNA amplification      Trichomonas DNA amplification      CBC and differential      Comprehensive metabolic panel      EKG 12 lead (initial)      Insert peripheral IV      clonazePAM (KlonoPIN) tablet 0.5 mg    EKG Interpretation: normal sinus rhythm, no ischemic changes    Independent review of: Existing labs, chart/prior records    ED Course and Disposition:    Update:  Patient's blood work is unremarkable.  EKG is reassuring.  Patient has received treatment for STIs and a yeast infection and so is now safe for discharge.              Luella Cook, MD          Luella Cook, MD  05/20/21 [phone]

## 2021-05-18 NOTE — ED Notes (Signed)
Pt states "I want to leave AMA, I think I was just having a panic attack and I'm ready to go". This Clinical research associate provided education on benefits and risks of staying vs leaving AMA. Pt verbalized understandings. AMA paperwork completed. Provider aware. [facility] notified.

## 2021-05-18 NOTE — ED Provider Progress Notes (Signed)
This note dictated at 6:54 PM.    Casey Hughes was here earlier sent down because of some abdominal discomfort and vaginal discharge.  In the busy emergency room I did not get a chance to see it.  She decided to sign out at approximately 6:30 PM.  We signed the AMA forms.    She is signed back in again some 20 minutes later.  She was going to go back to her residence but clearly that did not happen.    I doubt I will get to see her this  side of the shift change however.         Lucile Shutters, MD  05/18/21 [phone]

## 2021-05-19 ENCOUNTER — Encounter: Payer: Self-pay | Admitting: Family Medicine

## 2021-05-19 NOTE — Discharge Instructions (Signed)
You were seen at [facility] Emergency Department for pelvic pain and vaginal discharge.  You were treated for sexually transmitted diseases and a yeast infection presumptively.  These labs are unfortunately sent out and so will not come back for many hours.  If they are positive you will be contacted by the hospital or the health department.  Concerning your right upper abdominal pain, please follow-up with your primary care provider concerning this pain if it does not begin to improve.  Your evaluation today concerning this was reassuring and so you are now safe to be discharged.

## 2021-05-19 NOTE — ED Provider Notes (Signed)
History     Chief Complaint   Patient presents with    Abdominal Pain         Patient left without being seen.  And I do spoke to know consulted with the patient.  I understand she signed in with abdominal pain.    There will be no further notation therefore            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    Depression     Endometriosis 2011    Fibromyalgia     Hypothyroid     12/14 denies     Migraines     Seizures     last 03/2017 in context abrupt withdrawl from medication    SVT (supraventricular tachycardia) 2013        Patient Active Problem List   Diagnosis Code    Calculi, ureter N20.1    GERD (gastroesophageal reflux disease) K21.9    Hypothyroid E03.9    Bronchitis J40    Pneumonia J18.9    Anxiety F41.9    Depression F32.A    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    Renal calculi N20.0    SVT (supraventricular tachycardia) I47.1    Fibromyalgia M79.7    Positive ANA (antinuclear antibody) R76.8    Major depressive disorder, recurrent, moderate F33.1    PTSD (post-traumatic stress disorder) F43.10            Past Surgical History:   Procedure Laterality Date    APPENDECTOMY      BUNIONECTOMY      COLONOSCOPY      lithotripsy       Family History   Problem Relation Age of Onset    Anxiety disorder Mother     Arthritis Mother     Cancer Mother     Depression Mother     Emphysema Mother     Arthritis Father           Social History     Tobacco Use    Smoking status: Every Day     Packs/day: 0.50     Years: 5.00     Pack years: 2.50     Types: Vaping, Cigarettes    Smokeless tobacco: Never   Substance Use Topics    Alcohol use: Not Currently    Drug use: Not Currently     Types: Marijuana, Cocaine     Comment:  clean from crack cocaine 07/07/19     Living Situation     Questions Responses    Patient lives with Other(comment)     Comment: Reports being homeless     Homeless Yes    Caregiver for other family member No    External Services Mental Health Services    Comment: [employer]     Employment Unemployed    Comment: [employer]      Domestic Violence Risk No                Review of Systems   Review of Systems    Physical Exam     Triage Vitals  Triage Start: Start, (05/18/21 1644)   First Recorded BP: (!) 145/101, Resp: 18, Temp: 36.4 C (97.5 F), Temp src: Oral Oxygen Therapy SpO2: 98 %, Oximetry Source: Lt Hand, O2  Device: None (Room air), Heart Rate: 105, (05/18/21 1646)  .  First Pain Reported  0-10 Scale: 7, (05/18/21 1646)       Physical Exam    Medical Decision Making                 Lucile Shutters, MD          Lucile Shutters, MD  05/19/21 [phone]

## 2021-05-19 NOTE — ED Notes (Signed)
Report called to [facility]. Discharge instructions reviewed with patient all questions answered, patient verbalized understanding. Patient left ambulatory, with female escort and security back to [facility]

## 2021-05-20 ENCOUNTER — Other Ambulatory Visit
Admission: RE | Admit: 2021-05-20 | Discharge: 2021-05-20 | Disposition: A | Discharge status: Home / Self Care | Payer: Medicaid Other | Source: Ambulatory Visit | Attending: Family Medicine | Admitting: Family Medicine

## 2021-05-20 DIAGNOSIS — F1420 Cocaine dependence, uncomplicated: Secondary | ICD-10-CM | POA: Insufficient documentation

## 2021-05-20 LAB — CHLAMYDIA NAAT (PCR): Chlamydia Plasmid DNA Amplification: 0

## 2021-05-20 LAB — VAGINITIS SCREEN: DNA PROBE: Vaginitis Screen:DNA Probe: POSITIVE — AB

## 2021-05-20 LAB — N. GONORRHOEAE NAAT (PCR): N. gonorrhoeae DNA Amplification: 0

## 2021-05-20 LAB — TRICHOMONAS NAAT (PCR): Trichomonas DNA amplification: 0

## 2021-05-21 LAB — HEPATITIS C ANTIBODY: Hep C Ab: NEGATIVE

## 2021-05-21 LAB — SYPHILIS SCREEN
Syphilis Screen: NEGATIVE
Syphilis Status: NONREACTIVE

## 2021-05-21 LAB — HIV-1/2  ANTIGEN/ANTIBODY SCREEN WITH CONFIRMATION: HIV 1&2 ANTIGEN/ANTIBODY: NONREACTIVE

## 2021-05-22 NOTE — ED Provider Progress Notes (Signed)
Patient is positive for Gardnerella.    She is currently at [facility].  I called over there.  Gave him the information.  They are calling in today Flagyl 500 mg 3 times daily for 7 days as I was unable to access their pharmacy     Deidre Ala, [place]  05/22/21 [phone]

## 2021-05-29 ENCOUNTER — Emergency Department: Payer: Medicaid Other

## 2021-05-29 ENCOUNTER — Other Ambulatory Visit: Payer: Self-pay

## 2021-05-29 ENCOUNTER — Emergency Department
Admission: EM | Admit: 2021-05-29 | Discharge: 2021-05-29 | Disposition: A | Discharge status: Home / Self Care | Payer: Medicaid Other | Source: Ambulatory Visit | Attending: Emergency Medicine | Admitting: Emergency Medicine

## 2021-05-29 DIAGNOSIS — F1411 Cocaine abuse, in remission: Secondary | ICD-10-CM

## 2021-05-29 DIAGNOSIS — M79605 Pain in left leg: Secondary | ICD-10-CM | POA: Insufficient documentation

## 2021-05-29 DIAGNOSIS — G8929 Other chronic pain: Secondary | ICD-10-CM | POA: Insufficient documentation

## 2021-05-29 DIAGNOSIS — F17210 Nicotine dependence, cigarettes, uncomplicated: Secondary | ICD-10-CM | POA: Insufficient documentation

## 2021-05-29 DIAGNOSIS — M79604 Pain in right leg: Secondary | ICD-10-CM | POA: Insufficient documentation

## 2021-05-29 DIAGNOSIS — E669 Obesity, unspecified: Secondary | ICD-10-CM | POA: Insufficient documentation

## 2021-05-29 DIAGNOSIS — F17290 Nicotine dependence, other tobacco product, uncomplicated: Secondary | ICD-10-CM

## 2021-05-29 DIAGNOSIS — M7989 Other specified soft tissue disorders: Secondary | ICD-10-CM | POA: Insufficient documentation

## 2021-05-29 DIAGNOSIS — R0602 Shortness of breath: Secondary | ICD-10-CM | POA: Insufficient documentation

## 2021-05-29 DIAGNOSIS — M545 Low back pain, unspecified: Secondary | ICD-10-CM | POA: Insufficient documentation

## 2021-05-29 LAB — CBC AND DIFFERENTIAL
Baso # K/uL: 0 10*3/uL (ref 0.0–0.1)
Basophil %: 0.3 %
Eos # K/uL: 0.1 10*3/uL (ref 0.0–0.4)
Eosinophil %: 1.2 %
Hematocrit: 36 % (ref 34–45)
Hemoglobin: 12.2 g/dL (ref 11.2–15.7)
IMM Granulocytes #: 0.2 10*3/uL — ABNORMAL HIGH (ref 0.0–0.0)
IMM Granulocytes: 1.5 %
Lymph # K/uL: 2 10*3/uL (ref 1.2–3.7)
Lymphocyte %: 20.4 %
MCH: 31 pg (ref 26–32)
MCHC: 34 g/dL (ref 32–36)
MCV: 90 fL (ref 79–95)
Mono # K/uL: 0.7 10*3/uL (ref 0.2–0.9)
Monocyte %: 6.7 %
Neut # K/uL: 7 10*3/uL — ABNORMAL HIGH (ref 1.6–6.1)
Nucl RBC # K/uL: 0 10*3/uL (ref 0.0–0.0)
Nucl RBC %: 0 /100 WBC (ref 0.0–0.2)
Platelets: 223 10*3/uL (ref 160–370)
RBC: 3.9 MIL/uL (ref 3.9–5.2)
RDW: 13.2 % (ref 11.7–14.4)
Seg Neut %: 69.9 %
WBC: 10 10*3/uL (ref 4.0–10.0)

## 2021-05-29 LAB — BASIC METABOLIC PANEL
Anion Gap: 10 (ref 7–16)
CO2: 23 mmol/L (ref 20–28)
Calcium: 9 mg/dL (ref 8.8–10.2)
Chloride: 101 mmol/L (ref 96–108)
Creatinine: 0.52 mg/dL (ref 0.51–0.95)
Glucose: 101 mg/dL — ABNORMAL HIGH (ref 60–99)
Lab: 9 mg/dL (ref 6–20)
Potassium: 4.1 mmol/L (ref 3.4–4.7)
Sodium: 134 mmol/L (ref 133–145)
eGFR BY CREAT: 128 *

## 2021-05-29 LAB — NT-PRO BNP: NT-pro BNP: 83 pg/mL (ref 0–450)

## 2021-05-29 NOTE — ED Triage Notes (Signed)
Pt came into the ER from [facility] stating that the provider she was seeing over there wanted her to get looked at for the swelling in BLE, her back pain, and possible fluid in lungs. Pt stated a nurse listened to her lungs and stated it sounds like there is fluid. Pt states the swelling is worse in the right leg and that her legs have been swollen since October 1st. Pt states she has been experiencing SOB w/ exertion.

## 2021-05-29 NOTE — Discharge Instructions (Addendum)
You were seen for leg swelling.  Your tests did not show fluid in your lungs or increased fluid in your heart/body.  Your kidney function was normal.    Your overall risk of blood clots is low but you should follow up for an ultrasound of your legs to look for a blood clot. Call to schedule an appointment.

## 2021-05-29 NOTE — ED Provider Notes (Signed)
History     Chief Complaint   Patient presents with    Leg Swelling    Back Pain     HPI    Casey Hughes is a 30 y.o. female with history of CKD, borderline personality disorder, fibromyalgia presents emergency department with bilateral leg swelling and pain that is ongoing for 1 month.Denies history of DVT/PE, recent injury/trauma, hormonal therapy. Reports shortness of breath as well. She has had chronic lower back pain and reportedly has a herniated disc.  Has been taking ibuprofen for pain without relief. Currently in rehab for cocaine use.     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    Depression     Endometriosis 2011    Fibromyalgia     Hypothyroid     12/14 denies     Migraines     Seizures     last 03/2017 in context abrupt withdrawl from medication    SVT (supraventricular tachycardia) 2013        Patient Active Problem List   Diagnosis Code    Calculi, ureter N20.1    GERD (gastroesophageal reflux disease) K21.9    Hypothyroid E03.9    Bronchitis J40    Pneumonia J18.9    Anxiety F41.9    Depression F32.A    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    Renal calculi N20.0    SVT (supraventricular tachycardia) I47.1    Fibromyalgia M79.7    Positive ANA (antinuclear antibody) R76.8    Major depressive disorder, recurrent, moderate F33.1    PTSD (post-traumatic stress disorder) F43.10            Past Surgical History:   Procedure Laterality Date    APPENDECTOMY      BUNIONECTOMY      COLONOSCOPY      lithotripsy       Family History   Problem Relation Age of Onset    Anxiety disorder Mother     Arthritis Mother     Cancer Mother     Depression Mother     Emphysema Mother     Arthritis Father           Social History     Tobacco Use    Smoking status: Every Day     Types: Vaping, Cigarettes     Last attempt to quit: 04/18/2021     Years  since quitting: 0.1    Smokeless tobacco: Never   Substance Use Topics    Alcohol use: Not Currently    Drug use: Not Currently     Types: Marijuana, Cocaine     Comment:  clean from crack cocaine 07/07/19     Living Situation     Questions Responses    Patient lives with Other(comment)    Comment: Reports being homeless - stays at [employer] Yes    Caregiver for other family member No    External Services Mental Health Services    Comment: [employer]     Employment Unemployed    Comment: [employer]      Domestic Violence Risk No                Review of Systems   Review of Systems   Constitutional: Negative for activity change.  Respiratory: Positive for shortness of breath.    Cardiovascular: Positive for leg swelling. Negative for chest pain.   Musculoskeletal: Positive for back pain.       Physical Exam     Triage Vitals  Triage Start: Start, (05/29/21 1218)   First Recorded BP: (!) 143/101, Resp: 22, Temp: 36.2 C (97.2 F), Temp src: TEMPORAL Oxygen Therapy SpO2: 97 %, Oximetry Source: Rt Hand, O2 Device: None (Room air), Heart Rate: 105, (05/29/21 1218) Heart Rate (via Pulse Ox): 105, (05/29/21 1218).  First Pain Reported  0-10 Scale: 10, Pain Location/Orientation: Back, (05/29/21 1218)       Physical Exam  Vitals and nursing note reviewed.   Constitutional:       General: She is not in acute distress.     Appearance: She is well-developed. She is obese.      Comments: No acute distress, well-appearing   HENT:      Head: Normocephalic and atraumatic.   Eyes:      Conjunctiva/sclera: Conjunctivae normal.   Cardiovascular:      Rate and Rhythm: Normal rate and regular rhythm.      Heart sounds: Normal heart sounds. No murmur heard.    No friction rub. No gallop.   Pulmonary:      Effort: Pulmonary effort is normal. No respiratory distress.      Breath sounds: Normal breath sounds. No stridor. No wheezing or rales.      Comments: Lungs clear  Musculoskeletal:      Comments: Mild tenderness of the bilateral  paraspinal muscles in lumbar region. No midine tenderness in C/T  spine    Lower leg without pitting edema or skin changes. Intact sensation and circulation. No deformity or focal calf tenderness.   Skin:     General: Skin is warm.      Capillary Refill: Capillary refill takes less than 2 seconds.   Neurological:      General: No focal deficit present.      Mental Status: She is alert.         Medical Decision Making   Patient seen by me on:  05/29/2021    Assessment:  30 year old female with report of shortness of breath, leg swelling, and chronic lower back pain.  She is well-appearing.  Mildly hypertensive/tachycardic likely related to pain vs anxiety. Leg exam not indicative of b/l DVT.     Differential diagnosis:  Chronic pain, disc herniation, paraspinal muscle strain, unlikely CHF, unlikely DVT, unlikely PE, unlikely pneumothorax, unlikely pneumonia    Plan:   Patient reportedly is unable to take Tylenol due to an enlarged liver but I do not see a clear indication of this on her chart.    Orders Placed This Encounter      *Chest STANDARD single view      US doppler vein bilateral lower extremities      CBC and differential      Basic metabolic panel      NT-pro BNP      Insert peripheral IV        ED Course and Disposition:  Labs reviewed: No leukocytosis.  Renal function normal. BNP normal.  Chest x-ray clear.  Her overall low risk for DVT is very low but reassurance will order b/l dopplers. It is the weekend so unable to complete until next week. Will have patient follow up for the scan.    Pt had previously been referred to pain management but unclear if she has been able  to follow up.     Patient requesting medications for pain.  Offered ibuprofen, Lidoderm patch, Tylenol.  She says that dilaudid and morphine has worked for her but I declined to provide her with IV opiates for her chronic pain, especially since patient has a higher risk of substance abuse and it would not be the appropriate course of  treatment.          ED Course as of 05/29/21 1338   Sat May 29, 2021   1335 Improving right infrahilar aeration with no acute cardiopulmonary disease   1335 NT-pro BNP: 83   1336 WBC: 10.0       Gretta Cool, MD          Gretta Cool, MD  05/29/21 1352

## 2021-09-03 ENCOUNTER — Encounter: Payer: Self-pay | Admitting: Emergency Medicine

## 2021-09-03 ENCOUNTER — Emergency Department: Payer: Medicaid Other | Admitting: Radiology

## 2021-09-03 ENCOUNTER — Emergency Department
Admission: EM | Admit: 2021-09-03 | Discharge: 2021-09-04 | Disposition: A | Discharge status: Home / Self Care | Payer: Medicaid Other | Source: Ambulatory Visit | Attending: Student in an Organized Health Care Education/Training Program | Admitting: Student in an Organized Health Care Education/Training Program

## 2021-09-03 DIAGNOSIS — Y9389 Activity, other specified: Secondary | ICD-10-CM | POA: Insufficient documentation

## 2021-09-03 DIAGNOSIS — S92351A Displaced fracture of fifth metatarsal bone, right foot, initial encounter for closed fracture: Secondary | ICD-10-CM

## 2021-09-03 DIAGNOSIS — Y9289 Other specified places as the place of occurrence of the external cause: Secondary | ICD-10-CM | POA: Insufficient documentation

## 2021-09-03 DIAGNOSIS — F17290 Nicotine dependence, other tobacco product, uncomplicated: Secondary | ICD-10-CM | POA: Insufficient documentation

## 2021-09-03 DIAGNOSIS — W19XXXA Unspecified fall, initial encounter: Secondary | ICD-10-CM

## 2021-09-03 DIAGNOSIS — Y998 Other external cause status: Secondary | ICD-10-CM | POA: Insufficient documentation

## 2021-09-03 DIAGNOSIS — S92354A Nondisplaced fracture of fifth metatarsal bone, right foot, initial encounter for closed fracture: Secondary | ICD-10-CM | POA: Insufficient documentation

## 2021-09-03 DIAGNOSIS — X501XXA Overexertion from prolonged static or awkward postures, initial encounter: Secondary | ICD-10-CM | POA: Insufficient documentation

## 2021-09-03 MED ORDER — ONDANSETRON 4 MG PO TBDP *I*
4.0000 mg | ORAL_TABLET | Freq: Once | ORAL | Status: AC
Start: 2021-09-04 — End: 2021-09-03
  Administered 2021-09-03: 4 mg via ORAL
  Filled 2021-09-03: qty 1

## 2021-09-03 MED ORDER — OXYCODONE HCL 5 MG PO TABS *I*
5.0000 mg | ORAL_TABLET | Freq: Once | ORAL | Status: AC
  Administered 2021-09-03: 5 mg via ORAL
  Filled 2021-09-03: qty 1

## 2021-09-03 MED ORDER — ACETAMINOPHEN 500 MG PO TABS *I*
1000.0000 mg | ORAL_TABLET | Freq: Once | ORAL | Status: AC
Start: 2021-09-04 — End: 2021-09-03
  Administered 2021-09-03: 1000 mg via ORAL
  Filled 2021-09-03: qty 2

## 2021-09-03 MED ORDER — IBUPROFEN 600 MG PO TABS *I*
600.0000 mg | ORAL_TABLET | Freq: Once | ORAL | Status: AC
Start: 2021-09-04 — End: 2021-09-03
  Administered 2021-09-03: 600 mg via ORAL
  Filled 2021-09-03: qty 1

## 2021-09-03 NOTE — ED Triage Notes (Signed)
Patient presents to ED by EMS with right foot pain which she developed after her leg giving out while attempting to stand         Prehospital medications given: No

## 2021-09-04 ENCOUNTER — Encounter: Payer: Self-pay | Admitting: Student in an Organized Health Care Education/Training Program

## 2021-09-04 MED ORDER — MORPHINE SULFATE 15 MG PO TABS *I*
15.0000 mg | ORAL_TABLET | Freq: Four times a day (QID) | ORAL | 0 refills | Status: DC | PRN
Start: 2021-09-04 — End: 2022-08-22

## 2021-09-04 NOTE — ED Provider Notes (Signed)
History     Chief Complaint   Patient presents with    Foot Pain     31 year old woman with history of fibromyalgia, morbid obesity, BPD, presenting with right foot/ankle pain after a fall.  She reports that she stood up to answer the door and rolled her right ankle.  Had immediate pain in the right lateral foot/ankle with difficulty bearing weight due to pain.  Did not strike her head or lose consciousness.  No prior injuries or surgeries to the right lower extremity.      History provided by:  Patient and medical records  Language interpreter used: No          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    Depression     Endometriosis 2011    Fibromyalgia     Hypothyroid     12/14 denies     Migraines     Seizures     last 03/2017 in context abrupt withdrawl from medication    SVT (supraventricular tachycardia) 2013        Patient Active Problem List   Diagnosis Code    Calculi, ureter N20.1    GERD (gastroesophageal reflux disease) K21.9    Hypothyroid E03.9    Bronchitis J40    Pneumonia J18.9    Anxiety F41.9    Depression F32.A    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    Renal calculi N20.0    SVT (supraventricular tachycardia) I47.1    Fibromyalgia M79.7    Positive ANA (antinuclear antibody) R76.8    Major depressive disorder, recurrent, moderate F33.1    PTSD (post-traumatic stress disorder) F43.10            Past Surgical History:   Procedure Laterality Date    APPENDECTOMY      BUNIONECTOMY      COLONOSCOPY      lithotripsy       Family History   Problem Relation Age of Onset    Anxiety disorder Mother     Arthritis Mother     Cancer Mother     Depression Mother     Emphysema Mother     Arthritis Father           Social History     Tobacco Use    Smoking status: Every Day     Types: Vaping, Cigarettes     Last attempt to quit: 04/18/2021      Years since quitting: 0.3    Smokeless tobacco: Never   Substance Use Topics    Alcohol use: Not Currently    Drug use: Not Currently     Types: Marijuana, Cocaine     Comment:  clean from crack cocaine 07/07/19     Living Situation     Questions Responses    Patient lives with Other(comment)    Comment: Reports being homeless - stays at [employer] Yes    Caregiver for other family member No    External Services Mental Health Services    Comment: [employer]     Employment Unemployed    Comment: [employer]      Domestic Violence Risk No                Review of Systems  Review of Systems    Physical Exam     Triage Vitals  Triage Start: Start, (09/03/21 2246)   First Recorded BP: 111/53, Resp: 22, Temp: 36.1 C (97 F) Oxygen Therapy SpO2: 96 %, O2 Device: None (Room air), Heart Rate: (!) 134, (09/03/21 2246)  .  First Pain Reported  0-10 Scale: 10, (09/03/21 2246)       Physical Exam  Vitals and nursing note reviewed.   Constitutional:       General: She is not in acute distress.  HENT:      Head: Atraumatic.      Mouth/Throat:      Mouth: Mucous membranes are moist.   Cardiovascular:      Rate and Rhythm: Normal rate and regular rhythm.      Comments: 1+ DP/PT pulses bilaterally  Pulmonary:      Effort: Pulmonary effort is normal. No respiratory distress.   Musculoskeletal:      Comments: Swelling and focal tenderness over the proximal fifth metatarsal of the right foot with more mild tenderness over the lateral ankle and midfoot.  No proximal tib-fib tenderness.   Skin:     General: Skin is warm and dry.      Capillary Refill: Capillary refill takes less than 2 seconds.   Neurological:      Mental Status: She is alert and oriented to person, place, and time.      Comments:   Face symmetric, speech clear and fluent  5/5 strength in all extremities  SILT in all extremities         Medical Decision Making   Patient seen by me on:  09/03/2021    Assessment:  31 year old woman presenting to emergency department  with right lateral foot/ankle pain after a fall at home.  Exam concerning for possible fifth metatarsal fracture, differential diagnosis includes ankle fracture, ankle sprain.  No evidence of open fracture or threatened skin.  No neurovascular deficits.    Plan:    Acetaminophen, ibuprofen, oxycodone  X-rays of right foot and ankle.    Independent interpretation of imaging: Normal ankle x-rays, foot x-rays show pseudo Windmill fracture at base of right fifth metatarsal    ED Course and Disposition:    X-ray results as above, plan to treat conservatively with protected weightbearing and outpatient orthopedics follow-up.  Patient provided with walking boot and crutches.  Discharged home with instructions to continue multimodal analgesia, PO morphine ordered for breakthrough pain.  Patient provided with contact information for orthopedics clinic to arrange follow-up.              Liam Graham, MD             Liam Graham, MD  09/04/21 [phone]

## 2021-09-04 NOTE — Discharge Instructions (Signed)
You were seen in the Emergency Department for a foot injury.  Your X rays showed an injury to one of the bones in your foot.     To do after discharge  - Call the orthopedics clinic for a follow up appointment  - You can put weight on your foot while you are wearing the walking boot  - Use crutches as needed for comfort  - Take acetaminophen 1000 mg every 8 hours and ibuprofen 600 mg every 8 hours as needed for pain  - You can take morphine 15mg  up to every 6 hours if pain is still severe after other meds or is keeping you up at night    Return the Emergency Department if  - Your pain gets much worse  - You have any other concerning symptoms or you feel unsafe at home

## 2021-09-11 ENCOUNTER — Emergency Department: Payer: Medicaid Other

## 2021-09-11 ENCOUNTER — Emergency Department
Admission: EM | Admit: 2021-09-11 | Discharge: 2021-09-11 | Disposition: A | Discharge status: Home / Self Care | Payer: Medicaid Other | Source: Ambulatory Visit | Attending: Family | Admitting: Family

## 2021-09-11 DIAGNOSIS — R9431 Abnormal electrocardiogram [ECG] [EKG]: Secondary | ICD-10-CM

## 2021-09-11 DIAGNOSIS — R059 Cough, unspecified: Secondary | ICD-10-CM | POA: Insufficient documentation

## 2021-09-11 DIAGNOSIS — Z20822 Contact with and (suspected) exposure to covid-19: Secondary | ICD-10-CM | POA: Insufficient documentation

## 2021-09-11 DIAGNOSIS — F17210 Nicotine dependence, cigarettes, uncomplicated: Secondary | ICD-10-CM | POA: Insufficient documentation

## 2021-09-11 DIAGNOSIS — I517 Cardiomegaly: Secondary | ICD-10-CM

## 2021-09-11 DIAGNOSIS — Z1159 Encounter for screening for other viral diseases: Secondary | ICD-10-CM | POA: Insufficient documentation

## 2021-09-11 DIAGNOSIS — J020 Streptococcal pharyngitis: Secondary | ICD-10-CM | POA: Insufficient documentation

## 2021-09-11 DIAGNOSIS — I471 Supraventricular tachycardia: Secondary | ICD-10-CM

## 2021-09-11 DIAGNOSIS — R0602 Shortness of breath: Secondary | ICD-10-CM | POA: Insufficient documentation

## 2021-09-11 LAB — CBC AND DIFFERENTIAL
Baso # K/uL: 0.1 10*3/uL (ref 0.0–0.1)
Basophil %: 0.4 %
Eos # K/uL: 0.3 10*3/uL (ref 0.0–0.4)
Eosinophil %: 1.9 %
Hematocrit: 43 % (ref 34–45)
Hemoglobin: 14.2 g/dL (ref 11.2–15.7)
IMM Granulocytes #: 0.1 10*3/uL — ABNORMAL HIGH (ref 0.0–0.0)
IMM Granulocytes: 0.6 %
Lymph # K/uL: 2.3 10*3/uL (ref 1.2–3.7)
Lymphocyte %: 13.3 %
MCH: 31 pg (ref 26–32)
MCHC: 33 g/dL (ref 32–36)
MCV: 92 fL (ref 79–95)
Mono # K/uL: 0.9 10*3/uL (ref 0.2–0.9)
Monocyte %: 5.1 %
Neut # K/uL: 13.5 10*3/uL — ABNORMAL HIGH (ref 1.6–6.1)
Nucl RBC # K/uL: 0 10*3/uL (ref 0.0–0.0)
Nucl RBC %: 0 /100 WBC (ref 0.0–0.2)
Platelets: 291 10*3/uL (ref 160–370)
RBC: 4.7 MIL/uL (ref 3.9–5.2)
RDW: 13.5 % (ref 11.7–14.4)
Seg Neut %: 78.7 %
WBC: 17.1 10*3/uL — ABNORMAL HIGH (ref 4.0–10.0)

## 2021-09-11 LAB — BASIC METABOLIC PANEL
Anion Gap: 12 (ref 7–16)
CO2: 27 mmol/L (ref 20–28)
Calcium: 9.1 mg/dL (ref 8.8–10.2)
Chloride: 97 mmol/L (ref 96–108)
Creatinine: 0.58 mg/dL (ref 0.51–0.95)
Glucose: 91 mg/dL (ref 60–99)
Lab: 7 mg/dL (ref 6–20)
Potassium: 3.8 mmol/L (ref 3.4–4.7)
Sodium: 136 mmol/L (ref 133–145)
eGFR BY CREAT: 125 *

## 2021-09-11 LAB — RSV PCR: RSV PCR: 0

## 2021-09-11 LAB — COVID-19 NAAT (PCR): COVID-19 NAAT (PCR): NEGATIVE

## 2021-09-11 LAB — INFLUENZA B PCR: Influenza B PCR: 0

## 2021-09-11 LAB — PERFORMING LAB

## 2021-09-11 LAB — PREGNANCY TEST, SERUM: Preg,Serum: NEGATIVE

## 2021-09-11 LAB — D-DIMER, QUANTITATIVE: D-Dimer: 1.66 ug/mL FEU — ABNORMAL HIGH (ref 0.00–0.50)

## 2021-09-11 LAB — INFLUENZA A: Influenza A PCR: 0

## 2021-09-11 MED ORDER — DEXAMETHASONE SODIUM PHOSPHATE 10 MG/ML IJ SOLN *I*
10.0000 mg | Freq: Once | INTRAMUSCULAR | Status: AC
Start: 2021-09-11 — End: 2021-09-11
  Administered 2021-09-11: 10 mg via INTRAVENOUS
  Filled 2021-09-11: qty 1

## 2021-09-11 MED ORDER — IOHEXOL 350 MG/ML (OMNIPAQUE) IV SOLN 500ML BOTTLE *I*
1.0000 mL | Freq: Once | INTRAVENOUS | Status: AC
Start: 2021-09-11 — End: 2021-09-11
  Administered 2021-09-11: 70 mL via INTRAVENOUS

## 2021-09-11 MED ORDER — IPRATROPIUM-ALBUTEROL 0.5-2.5 MG/3ML IN SOLN *I*
3.0000 mL | Freq: Once | RESPIRATORY_TRACT | Status: AC
Start: 2021-09-11 — End: 2021-09-11
  Administered 2021-09-11: 3 mL via RESPIRATORY_TRACT
  Filled 2021-09-11: qty 3

## 2021-09-11 MED ORDER — BENZONATATE 100 MG PO CAPS *I*
100.0000 mg | ORAL_CAPSULE | Freq: Once | ORAL | Status: AC
Start: 2021-09-11 — End: 2021-09-11
  Administered 2021-09-11: 100 mg via ORAL
  Filled 2021-09-11: qty 1

## 2021-09-11 MED ORDER — AMOXICILLIN 500 MG PO CAPS *I*: 500.0000 mg | ORAL_CAPSULE | Freq: Three times a day (TID) | ORAL | 0 refills | Status: AC

## 2021-09-11 MED ORDER — METHYLPREDNISOLONE 4 MG PO TBPK *A*
ORAL_TABLET | ORAL | 0 refills | Status: DC
Start: 2021-09-11 — End: 2022-08-22

## 2021-09-11 MED ORDER — AMOXICILLIN 500 MG PO CAPS *I*
500.0000 mg | ORAL_CAPSULE | Freq: Once | ORAL | Status: AC
Start: 2021-09-11 — End: 2021-09-11
  Administered 2021-09-11: 500 mg via ORAL
  Filled 2021-09-11: qty 1

## 2021-09-11 MED ORDER — SODIUM CHLORIDE 0.9 % IV BOLUS *I*
1000.0000 mL | Freq: Once | Status: AC
Start: 2021-09-11 — End: 2021-09-11
  Administered 2021-09-11: 1000 mL via INTRAVENOUS

## 2021-09-11 NOTE — ED Provider Notes (Addendum)
History     Chief Complaint   Patient presents with    Shortness of Breath     31 year old female presents to the ED with complaints of SOB for one week that worsened when she woke up this morning and did not get better throughout the day today. She was [facility] and started on cephalexin which she did not finish because she didn't like the taste of the pills. She states her throat is still sore, she is having chills, cough and congestion. She has tried taking ibuprofen for the pain in her throat with no relief. She received a duoneb in the ambulance on the way here which she says helped a little but still feels quite short of breath. She was in the ED last week for a fracture of the 5th metatarsal and is in a boot and walks with a walker.             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    Depression     Endometriosis 2011    Fibromyalgia     Hypothyroid     12/14 denies     Migraines     Seizures     last 03/2017 in context abrupt withdrawl from medication    SVT (supraventricular tachycardia) 2013        Patient Active Problem List   Diagnosis Code    Calculi, ureter N20.1    GERD (gastroesophageal reflux disease) K21.9    Hypothyroid E03.9    Bronchitis J40    Pneumonia J18.9    Anxiety F41.9    Depression F32.A    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    Renal calculi N20.0    SVT (supraventricular tachycardia) I47.1    Fibromyalgia M79.7    Positive ANA (antinuclear antibody) R76.8    Major depressive disorder, recurrent, moderate F33.1    PTSD (post-traumatic stress disorder) F43.10            Past Surgical History:   Procedure Laterality Date    APPENDECTOMY      BUNIONECTOMY      COLONOSCOPY      lithotripsy       Family History   Problem Relation Age of Onset    Anxiety disorder Mother      Arthritis Mother     Cancer Mother     Depression Mother     Emphysema Mother     Arthritis Father           Social History     Tobacco Use    Smoking status: Every Day     Types: Vaping, Cigarettes     Last attempt to quit: 04/18/2021     Years since quitting: 0.4    Smokeless tobacco: Never   Substance Use Topics    Alcohol use: Not Currently    Drug use: Not Currently     Types: Marijuana, Cocaine     Comment:  clean from crack cocaine 07/07/19     Living Situation     Questions Responses    Patient lives with Other(comment)    Comment: Reports being homeless - stays at [employer] Yes    Caregiver for other family  member No    External Services Mental Health Services    Comment: [employer]     Employment Unemployed    Comment: [employer]      Domestic Violence Risk No                Review of Systems   Review of Systems   Constitutional: Positive for chills and fever.   HENT: Positive for congestion, rhinorrhea and sore throat. Negative for sinus pressure and sinus pain.    Respiratory: Positive for cough and shortness of breath. Negative for wheezing.    Gastrointestinal: Negative for abdominal pain, constipation, diarrhea, nausea and vomiting.   Neurological: Negative for dizziness, weakness, light-headedness and headaches.       Physical Exam     Triage Vitals      First Recorded BP: 112/60, Resp: 18, Temp: 36.6 C (97.9 F), Temp src: TEMPORAL Oxygen Therapy SpO2: 98 %, O2 Device: None (Room air), Heart Rate: 106, (09/11/21 1410)  .      Physical Exam  Vitals and nursing note reviewed.   Constitutional:       Appearance: She is well-developed.   HENT:      Head: Normocephalic and atraumatic.      Mouth/Throat:      Mouth: Mucous membranes are moist.      Pharynx: Oropharynx is clear.      Comments: Tonsils are red +1  Eyes:      Extraocular Movements: Extraocular movements intact.   Cardiovascular:      Rate and Rhythm: Normal rate and regular rhythm.      Pulses: Normal pulses.      Heart sounds:  Normal heart sounds.   Pulmonary:      Effort: Pulmonary effort is normal. No tachypnea, bradypnea or respiratory distress.      Breath sounds: Normal breath sounds. No decreased breath sounds, wheezing, rhonchi or rales.   Musculoskeletal:      Cervical back: Normal range of motion and neck supple.   Skin:     General: Skin is warm and dry.   Neurological:      General: No focal deficit present.      Mental Status: She is alert.         Medical Decision Making     Assessment:  31 year old female presents with SOB for one week. She was treated for strep throat 2 weeks ago but did not complete antibiotics, she was also seen one week ago at [facility] for a metatarsal fracture and sent home in a boot.     Differential diagnosis:  PE   Pneumonia   URI  Pharyngitis     Plan:  Orders Placed This Encounter      Influenza A PCR      Influenza B PCR      RSV PCR      *Chest standard frontal and lateral views      CBC and differential      Basic metabolic panel      D-dimer, quantitative      Pregnancy Test, Serum      COVID-19 PCR        ED Course and Disposition:  Patient was seen and evaluated for SOB. ddimer was positive so CTA was ordered and came back normal. Was given duonebs once in the ambulance and once in the ER. Will go home with amoxicillin and steroid taperto treat the strep throat from a few weeks ago. Checked back in  on her after the second duoneb and she was feeling much better and less short of breath. She expressed that she wanted to be discharged and that her ride was there to pick her up so she refused a repeat EKG.          ED Course as of 09/11/21 1939   Sat Sep 11, 2021   1557 *Chest standard frontal and lateral views  No acute findings.    1636 D-Dimer(!): 1.66   1806 CT angio chest  There are no filling defects within the pulmonary arteries to suggest embolism.      1806 EKG 12 lead (initial)  Tried to get another EKG but she refused and wanted to leave    1905 COVID-19 PCR: NEGATIVE   1905 Preg,Serum:  NEG   1905 WBC(!): [address], [place]             [place], [place], [place]  09/11/21 1940       [place], [place]  09/12/21 1208       Aileen Pilot [place], [place]  09/12/21 1217

## 2021-09-11 NOTE — ED Procedure Documentation (Signed)
Procedures   Venipuncture provider  Performed by: Antonieta Pert, NP  Authorized by: Antonieta Pert, NP     Consent:     Consent obtained:  Verbal    Consent given by:  Patient    Risks discussed:  Bleeding, infection, pain, poor cosmetic result, nerve damage and incomplete drainage    Alternatives discussed:  No treatment  Universal protocol:     Procedure explained and questions answered to patient or proxy's satisfaction: yes      Relevant documents present and verified: yes      Test results available and properly labeled: yes      Patient identity confirmed:  Verbally with patient and arm band  Indications:     Indications:  IV Acess  Anesthesia (see MAR for exact dosages):     Anesthesia method:  None  Procedure details:     Venipuncture Site: RAC.  Post-procedure details:     Patient tolerance of procedure:  Tolerated well, no immediate complications        Antonieta Pert, NP     Antonieta Pert, NP  09/11/21 [phone]

## 2021-09-11 NOTE — ED Triage Notes (Signed)
Patient arrives for evaluation of asthma exacerbation via EMS. Per EMS, wheezing on arrival, given duoneb with positive effect, no distress on arrival.     BP 112/60 (BP Location: Left arm)    Pulse 106    Temp 36.6 C (97.9 F) (Temporal)    Resp 18    SpO2 98%     Prehospital medications given: Yes  Respiratory/Allergic Reaction: DuoNeb

## 2021-09-11 NOTE — ED Notes (Signed)
SOB x1 week. H/o SVT. R ankle fracture, boot in place. x1 duoneb per EMS for wheezing, improved on arrival. O2 96-98 ORA, 98 post neb

## 2021-09-11 NOTE — Bed Hold Note (Signed)
Bed: TED-18  Expected date:   Expected time:   Means of arrival:   Comments:  Amb- asthma exacerbation

## 2021-09-11 NOTE — ED Notes (Signed)
Pt w/ h/o   Past Medical History:   Diagnosis Date    Anxiety     Asthma     Borderline personality disorder     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    Depression     Endometriosis 2011    Fibromyalgia     Hypothyroid     12/14 denies     Migraines     Seizures     last 03/2017 in context abrupt withdrawl from medication    SVT (supraventricular tachycardia) 2013    presents with 1 week of progressively worsening SOB. On EMS arrival, SpO2 92% ORA, increased in mid 90s after x1 duoneb. Notably wheezy and tachypneic on arrival, reporting right sided chest pain. Denies fever, chills, n/v. Will continue to monitor and treat per orders

## 2021-09-11 NOTE — ED Provider Notes (Incomplete Revision)
History     Chief Complaint   Patient presents with   . Shortness of Breath     31 year old female presents to the ED with complaints of SOB for one week that worsened when she woke up this morning and did not get better throughout the day today. She was [facility] and started on cephalexin which she did not finish because she didn't like the taste of the pills. She states her throat is still sore, she is having chills, cough and congestion. She has tried taking ibuprofen for the pain in her throat with no relief. She received a duoneb in the ambulance on the way here which she says helped a little but still feels quite short of breath. She was in the ED last week for a fracture of the 5th metatarsal and is in a boot and walks with a walker.             Medical/Surgical/Family History     Past Medical History:   Diagnosis Date   . Anxiety    . Asthma    . Borderline personality disorder    . 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   . Depression    . Endometriosis 2011   . Fibromyalgia    . Hypothyroid     12/14 denies    . Migraines    . Seizures     last 03/2017 in context abrupt withdrawl from medication   . SVT (supraventricular tachycardia) 2013        Patient Active Problem List   Diagnosis Code   . Calculi, ureter N20.1   . GERD (gastroesophageal reflux disease) K21.9   . Hypothyroid E03.9   . Bronchitis J40   . Pneumonia J18.9   . Anxiety F41.9   . Depression F32.A   . 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 Renal calculi N20.0   . SVT (supraventricular tachycardia) I47.1   . Fibromyalgia M79.7   . Positive ANA (antinuclear antibody) R76.8   . Major depressive disorder, recurrent, moderate F33.1   . PTSD (post-traumatic stress disorder) F43.10            Past Surgical History:   Procedure Laterality Date   . APPENDECTOMY     . BUNIONECTOMY     . COLONOSCOPY     . lithotripsy       Family History   Problem Relation Age of Onset   . Anxiety disorder Mother    .  Arthritis Mother    . Cancer Mother    . Depression Mother    . Emphysema Mother    . Arthritis Father           Social History     Tobacco Use   . Smoking status: Every Day     Types: Vaping, Cigarettes     Last attempt to quit: 04/18/2021     Years since quitting: 0.4   . Smokeless tobacco: Never   Substance Use Topics   . Alcohol use: Not Currently   . Drug use: Not Currently     Types: Marijuana, Cocaine     Comment:  clean from crack cocaine 07/07/19     Living Situation     Questions Responses    Patient lives with Other(comment)    Comment: Reports being homeless - stays at [facility] Yes    Caregiver for other family  member No    External Services Mental Health Services    Comment: [employer]     Employment Unemployed    Comment: [employer]      Domestic Violence Risk No                Review of Systems   Review of Systems   Constitutional: Positive for chills and fever.   HENT: Positive for congestion, rhinorrhea and sore throat. Negative for sinus pressure and sinus pain.    Respiratory: Positive for cough and shortness of breath. Negative for wheezing.    Gastrointestinal: Negative for abdominal pain, constipation, diarrhea, nausea and vomiting.   Neurological: Negative for dizziness, weakness, light-headedness and headaches.       Physical Exam     Triage Vitals     First Recorded BP: 112/60, Resp: 18, Temp: 36.6 C (97.9 F), Temp src: TEMPORAL Oxygen Therapy SpO2: 98 %, O2 Device: None (Room air), Heart Rate: 106, (09/11/21 1410)  .      Physical Exam  Vitals and nursing note reviewed.   Constitutional:       Appearance: She is well-developed.   HENT:      Head: Normocephalic and atraumatic.      Mouth/Throat:      Mouth: Mucous membranes are moist.      Pharynx: Oropharynx is clear.      Comments: Tonsils are red +1  Eyes:      Extraocular Movements: Extraocular movements intact.   Cardiovascular:      Rate and Rhythm: Normal rate and regular rhythm.      Pulses: Normal pulses.      Heart sounds:  Normal heart sounds.   Pulmonary:      Effort: Pulmonary effort is normal. No tachypnea, bradypnea or respiratory distress.      Breath sounds: Normal breath sounds. No decreased breath sounds, wheezing, rhonchi or rales.   Musculoskeletal:      Cervical back: Normal range of motion and neck supple.   Skin:     General: Skin is warm and dry.   Neurological:      General: No focal deficit present.      Mental Status: She is alert.         Medical Decision Making     Assessment:  31 year old female presents with SOB for one week. She was treated for strep throat 2 weeks ago but did not complete antibiotics, she was also seen one week ago at [facility] for a metatarsal fracture and sent home in a boot.     Differential diagnosis:  PE   Pneumonia   URI    Plan:  Orders Placed This Encounter      Influenza A PCR      Influenza B PCR      RSV PCR      *Chest standard frontal and lateral views      CBC and differential      Basic metabolic panel      D-dimer, quantitative      Pregnancy Test, Serum      COVID-19 PCR        ED Course and Disposition:  Patient was seen and evaluated for SOB. ddimer was positive so CTA was ordered and came back normal. Was given duonebs once in the ambulance and once in the ER. Will go home with amoxicillin and steroid taperto treat the strep throat from a few weeks ago. Transported home via ambulance  ED Course as of 09/11/21 1939   Sat Sep 11, 2021   1557 *Chest standard frontal and lateral views  No acute findings.    1636 D-Dimer(!): 1.66   1806 CT angio chest  There are no filling defects within the pulmonary arteries to suggest embolism.      1806 EKG 12 lead (initial)  Tried to get another EKG but she refused and wanted to leave    1905 COVID-19 PCR: NEGATIVE   1905 Preg,Serum: NEG   1905 WBC(!): [address], [place]             [place], [place], [place]  09/11/21 1940

## 2021-09-14 LAB — EKG 12-LEAD
P: 137 deg
PR: 168 ms
QRS: 116 deg
QRSD: 96 ms
QT: 373 ms
QTc: 497 ms
Rate: 106 {beats}/min
T: 128 deg

## 2021-09-30 ENCOUNTER — Encounter: Payer: Self-pay | Admitting: Orthopedic Surgery

## 2021-09-30 ENCOUNTER — Other Ambulatory Visit: Payer: Self-pay

## 2021-09-30 ENCOUNTER — Ambulatory Visit: Payer: Medicaid Other | Admitting: Orthopedic Surgery

## 2021-09-30 ENCOUNTER — Ambulatory Visit
Admission: RE | Admit: 2021-09-30 | Discharge: 2021-09-30 | Disposition: A | Discharge status: Home / Self Care | Payer: Medicaid Other | Source: Ambulatory Visit

## 2021-09-30 VITALS — BP 152/94 | HR 127 | Ht 64.0 in | Wt 330.0 lb

## 2021-09-30 DIAGNOSIS — S92354A Nondisplaced fracture of fifth metatarsal bone, right foot, initial encounter for closed fracture: Secondary | ICD-10-CM

## 2021-09-30 NOTE — Progress Notes (Signed)
Subjective     CC: right foot injury    HPI: Casey Hughes is a 31 y.o. female who presents in regard to an isolated injury sustained on 09/03/2021 to her right foot and ankle.  Patient reports she was sitting on her bed in the "both her feet and legs went numb" she feels that this is secondary to her RA and fibromyalgia.  Nonetheless she suffered inversion injury to the right ankle.  She had pain discomfort went to [facility] ED for further evaluation.  At that point radiographs did reveal a subtle base of the fifth metatarsal avulsion fracture.  She was provided a tall cam walker boot and referred for further care.  Patient denies paresthesias.  She reports improvement in her discomfort but she does note significant disc discomfort unless she is walking with her boot.  She currently is unemployed.  She localizes pain at the base of the foot with no radiation up towards the ankle or knee.  She denies paresthesias.  She denies previous injury or trauma to the right foot.    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    Depression     Endometriosis 2011    Fibromyalgia     Hypothyroid     12/14 denies     Migraines     Seizures     last 03/2017 in context abrupt withdrawl from medication    SVT (supraventricular tachycardia) 2013     Past Surgical History:   Procedure Laterality Date    APPENDECTOMY      BUNIONECTOMY      COLONOSCOPY      lithotripsy       Allergies   Allergen Reactions    Lactose Diarrhea    Cipro Xr Nausea And Vomiting       Current Outpatient Medications:     methylPREDNISolone (MEDROL PAK) 4 MG tablet pack, Take according to package directions (6 day supply), Disp: 21 tablet, Rfl: 0    morphine 15 mg immediate release tablet, Take 1 tablet (15 mg total) by mouth every 6 hours as needed for Pain  Max daily dose: 60 mg, Disp: 12 tablet, Rfl:  0    famotidine (PEPCID) 20 mg tablet, Take 1 tablet (20 mg total) by mouth 2 times daily, Disp: , Rfl:     hydrOXYzine pamoate (VISTARIL) 50 mg capsule, Take 2 capsules (100 mg total) by mouth 2 times daily, Disp: , Rfl:     LIDOCAINE PAIN RELIEF 4 % patch, Apply 1 patch topically daily, Disp: , Rfl:     nicotine polacrilex (NICORETTE) 2 mg gum, SMARTSIG:By Mouth, Disp: , Rfl:     NYSTATIN 100000 UNIT/GM powder, SMARTSIG:Topical, Disp: , Rfl:     clonazePAM (KLONOPIN) 0.5 mg tablet, Take 1 tablet (0.5 mg total) by mouth 2 times daily as needed, Disp: , Rfl:     cloNIDine (CATAPRES) 0.1 mg tablet, Take 1 tablet (0.1 mg total) by mouth 3 times daily, Disp: , Rfl:     ibuprofen (ADVIL,MOTRIN) 600 mg tablet, Take 1 tablet (600 mg total) by mouth 3 times daily, Disp: , Rfl:     magnesium hydroxide (MILK OF MAGNESIA) 400 MG/5ML suspension, , Disp: , Rfl:     nicotine (NICODERM CQ) 21 MG/24HR patch, ,  Disp: , Rfl:     naloxone (NARCAN) 4 mg/0.1 mL nasal spray, SMARTSIG:1 Spray(s) Both Nares Daily PRN, Disp: , Rfl:     polyethylene glycol (GLYCOLAX) powder, take 1 CAPFUL(S) by mouth MIXED IN 8 OUNCES OF WATER DAILY, Disp: , Rfl:     predniSONE (DELTASONE) 20 mg tablet, Take 2 tablets (40 mg total) by mouth daily, Disp: , Rfl:     sucralfate (CARAFATE) 1 gm tablet, Take 1 tablet (1 g total) by mouth 4 times daily, Disp: , Rfl:     cetirizine (ZYRTEC) 10 mg tablet, Take 1 tablet (10 mg total) by mouth daily as needed, Disp: , Rfl:     OLANZapine (ZYPREXA) 2.5 mg tablet, Take 8 tablets (20 mg total) by mouth every morning, Disp: , Rfl:     omeprazole (PRILOSEC) 20 mg capsule, , Disp: , Rfl:     artificial tears (SYSTANE) 0.4-0.3 % ophthalmic solution, , Disp: , Rfl:     QUEtiapine (SEROQUEL) 25 mg tablet, , Disp: , Rfl:     hydrOXYzine HCl (ATARAX) 10 mg tablet, , Disp: , Rfl:     Benefiber POWD, , Disp: , Rfl:     Brexpiprazole (REXULTI PO), Take by mouth, Disp: , Rfl:     amphetamine-dextroamphetamine  (ADDERALL XR) 30 mg 24 hr capsule, Take by mouth, Disp: , Rfl:     clonazePAM (KLONOPIN) 2 MG tablet, Take 1 tablet (2 mg total) by mouth daily as needed, Disp: , Rfl:     prazosin (MINIPRESS) 2 MG capsule, Take 1 capsule (2 mg total) by mouth daily, Disp: , Rfl:     amphetamine-dextroamphetamine (ADDERALL) 30 MG tablet, Take 1 tablet (30 mg total) by mouth every morning  ER 30 mg daily, Disp: , Rfl:     fluticasone (FLONASE) 50 MCG/ACT nasal spray, Spray 2 sprays into nostril daily  2 sprays into each nostril daily, Disp: , Rfl:     albuterol HFA (PROVENTIL, VENTOLIN, PROAIR HFA) 108 (90 Base) MCG/ACT inhaler, Inhale 1-2 puffs into the lungs every 6 hours as needed for Wheezing  Shake well before each use., Disp: , Rfl:     hyoscyamine (ANASPAZ,LEVSIN) 0.125 MG tablet, Take 1 tablet (0.125 mg total) by mouth every 8 hours as needed for Cramping, Disp: , Rfl:     DULoxetine (CYMBALTA) 60 MG capsule, Take 2 capsules (120 mg total) by mouth daily, Disp: 60 capsule, Rfl: 0    traZODone (DESYREL) 50 MG tablet, Take 1 tablet (50 mg total) by mouth nightly, Disp: 30 tablet, Rfl: 0    ARIPiprazole (ABILIFY) 5 MG tablet, Take 5 mg by mouth daily (Patient not taking: Reported on 04/22/2020), Disp: , Rfl:     acetaminophen (TYLENOL) 500 mg tablet, Take 2 tablets (1,000 mg total) by mouth every 6 hours as needed for Pain (Patient not taking: Reported on 09/30/2021), Disp: 80 tablet, Rfl: 0    cyclobenzaprine (FLEXERIL) 5 MG tablet, Take 5 mg by mouth 3 times daily as needed for Muscle spasms (Patient not taking: Reported on 09/30/2021), Disp: , Rfl:   Social History     Socioeconomic History    Marital status: Single   Tobacco Use    Smoking status: Every Day     Types: Vaping, Cigarettes     Last attempt to quit: 04/18/2021     Years since quitting: 0.4    Smokeless tobacco: Never   Substance and Sexual Activity    Alcohol use: Not Currently    Drug  use: Not Currently     Types: Marijuana, Cocaine     Comment:   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       Family History   Problem Relation Age of Onset    Anxiety disorder Mother     Arthritis Mother     Cancer Mother     Depression Mother     Emphysema Mother     Arthritis Father        Information supplemental to this note including Past Medical/Surgical History, Medications, Allergies, Social History, Family History, Review of Systems is recorded in the annotated patient questionnaire of this date and has been scanned into the patient's chart.    Objective     BP (!) 152/94    Pulse (!) 127    Ht 1.626 m (5\' 4" )    Wt (!) 149.7 kg (330 lb)    SpO2 99%    BMI 56.64 kg/m   Estimated body mass index is 56.64 kg/m as calculated from the following:    Height as of this encounter: 1.626 m (5\' 4" ).    Weight as of this encounter: 149.7 kg (330 lb).  Constitutional: No acute distress, alert, oriented, and responding to questions appropriately, appearing stated age.  Ambulates with no assistance, she is wearing a tall cam walker boot    Musculoskeletal:   right lower extremity   Skin intact. No erythema, warmth or signs to suggest infection  No deformity noted  Minimal edema without ecchymosis  Warm and well perfused  Brisk capillary refill throughout  Sheila plantar and dorsiflex ankle.  Achilles intact.  She is nontender over the lateral ligaments of the ankle or lateral malleolus, medial malleolus.  She is point tender at the base of the fifth metatarsal with some mild lateral midfoot discomfort.  She is nontender through the hind or forefoot.  The foot is warm well perfused with brisk capillary refill distally.    Imaging     I have personally and independently reviewed radiographs of the right foot dated 09/30/2021 as well as initial radiographs from 09/03/2021 revealing fifth metatarsal base avulsion fracture.  There is sign to suggest interval healing on most recent  radiographs.    Assessment/Plan     Approaching 1 month status post fifth metatarsal base avulsion fracture    Activity modifcation was reviewed as well as dosing and precautions to NSAIDs and acetaminophen.  She can weight-bear as tolerated with her tall cam walker boot and progress normal shoewear as comfort allows    Follow-up: 4 weeks with repeat radiographs of the right foot.  My findings and impression were discussed with the patient. Questions were invited and answered to the patients satisfaction.   Patient will call with increasing difficulties, questions, concerns otherwise.

## 2021-10-18 NOTE — Progress Notes (Signed)
Tried to call pt no mailbox set-up. Sent referral letter

## 2021-11-02 ENCOUNTER — Ambulatory Visit: Payer: Medicaid Other | Admitting: Orthopedic Surgery

## 2021-11-02 ENCOUNTER — Ambulatory Visit
Admission: RE | Admit: 2021-11-02 | Discharge: 2021-11-02 | Disposition: A | Discharge status: Home / Self Care | Payer: Medicaid Other | Source: Ambulatory Visit

## 2021-11-02 DIAGNOSIS — S92354A Nondisplaced fracture of fifth metatarsal bone, right foot, initial encounter for closed fracture: Secondary | ICD-10-CM

## 2022-03-16 ENCOUNTER — Other Ambulatory Visit
Admission: RE | Admit: 2022-03-16 | Discharge: 2022-03-16 | Disposition: A | Discharge status: Home / Self Care | Payer: Medicaid Other | Source: Ambulatory Visit | Attending: Registered Nurse | Admitting: Registered Nurse

## 2022-03-16 DIAGNOSIS — E559 Vitamin D deficiency, unspecified: Secondary | ICD-10-CM | POA: Insufficient documentation

## 2022-03-16 DIAGNOSIS — K909 Intestinal malabsorption, unspecified: Secondary | ICD-10-CM | POA: Insufficient documentation

## 2022-03-16 DIAGNOSIS — F319 Bipolar disorder, unspecified: Secondary | ICD-10-CM | POA: Insufficient documentation

## 2022-03-16 DIAGNOSIS — F1910 Other psychoactive substance abuse, uncomplicated: Secondary | ICD-10-CM | POA: Insufficient documentation

## 2022-03-16 DIAGNOSIS — F419 Anxiety disorder, unspecified: Secondary | ICD-10-CM | POA: Insufficient documentation

## 2022-03-16 DIAGNOSIS — F909 Attention-deficit hyperactivity disorder, unspecified type: Secondary | ICD-10-CM | POA: Insufficient documentation

## 2022-03-16 LAB — REGIONAL CHEMICAL DEPEN SCRN, UR
Amphetamine,UR: POSITIVE — AB
Barbiturate,UR: NEGATIVE
Benzodiazepinen,UR: NEGATIVE
Buprenorphine,Ur: NEGATIVE
Cocaine/Metab,UR: NEGATIVE
Methadone Metab,UR: NEGATIVE
Methamphetamine,Ur: NEGATIVE
Opiates,UR: NEGATIVE
Oxycodone,UR: NEGATIVE
PCP,UR: NEGATIVE
THC Metabolite,UR: POSITIVE — AB
Tricyclics,UR: NEGATIVE

## 2022-03-16 LAB — CBC AND DIFFERENTIAL
Baso # K/uL: 0.1 10*3/uL (ref 0.0–0.1)
Basophil %: 0.5 %
Eos # K/uL: 0.1 10*3/uL (ref 0.0–0.4)
Eosinophil %: 0.7 %
Hematocrit: 44 % (ref 34–45)
Hemoglobin: 14 g/dL (ref 11.2–15.7)
IMM Granulocytes #: 0.1 10*3/uL — ABNORMAL HIGH (ref 0.0–0.0)
IMM Granulocytes: 0.6 %
Lymph # K/uL: 2.1 10*3/uL (ref 1.2–3.7)
Lymphocyte %: 21.6 %
MCH: 31 pg (ref 26–32)
MCHC: 32 g/dL (ref 32–36)
MCV: 95 fL (ref 79–95)
Mono # K/uL: 0.6 10*3/uL (ref 0.2–0.9)
Monocyte %: 6.4 %
Neut # K/uL: 6.8 10*3/uL — ABNORMAL HIGH (ref 1.6–6.1)
Nucl RBC # K/uL: 0 10*3/uL (ref 0.0–0.0)
Nucl RBC %: 0 /100 WBC (ref 0.0–0.2)
Platelets: 271 10*3/uL (ref 160–370)
RBC: 4.6 MIL/uL (ref 3.9–5.2)
RDW: 13.2 % (ref 11.7–14.4)
Seg Neut %: 70.2 %
WBC: 9.7 10*3/uL (ref 4.0–10.0)

## 2022-03-16 LAB — COMPREHENSIVE METABOLIC PANEL
ALT: 26 U/L (ref 0–35)
AST: 27 U/L (ref 0–35)
Albumin: 3.5 g/dL (ref 3.5–5.2)
Alk Phos: 115 U/L — ABNORMAL HIGH (ref 35–105)
Anion Gap: 11 (ref 7–16)
Bilirubin,Total: 0.5 mg/dL (ref 0.0–1.2)
CO2: 25 mmol/L (ref 20–28)
Calcium: 8.8 mg/dL (ref 8.8–10.2)
Chloride: 104 mmol/L (ref 96–108)
Creatinine: 0.54 mg/dL (ref 0.51–0.95)
Glucose: 114 mg/dL — ABNORMAL HIGH (ref 60–99)
Lab: 5 mg/dL — ABNORMAL LOW (ref 6–20)
Potassium: 3.8 mmol/L (ref 3.3–4.6)
Sodium: 140 mmol/L (ref 133–145)
Total Protein: 7.6 g/dL (ref 6.3–7.7)
eGFR BY CREAT: 127 *

## 2022-03-16 LAB — LIPID PANEL
Chol/HDL Ratio: 6.8
Cholesterol: 203 mg/dL — AB
HDL: 30 mg/dL — ABNORMAL LOW (ref 40–60)
LDL Calculated: 125 mg/dL
Non HDL Cholesterol: 173 mg/dL
Triglycerides: 238 mg/dL — AB

## 2022-03-16 LAB — TSH: TSH: 0.84 u[IU]/mL (ref 0.27–4.20)

## 2022-03-16 LAB — T4, FREE: Free T4: 1.3 ng/dL (ref 0.9–1.7)

## 2022-03-16 LAB — VITAMIN B12: Vitamin B12: 505 pg/mL (ref 232–1245)

## 2022-03-16 LAB — VITAMIN D: 25-OH Vit Total: 18 ng/mL — ABNORMAL LOW (ref 30–60)

## 2022-08-22 ENCOUNTER — Emergency Department: Payer: Medicaid Other

## 2022-08-22 ENCOUNTER — Observation Stay
Admission: EM | Admit: 2022-08-22 | Discharge: 2022-08-22 | Disposition: A | Discharge status: Home / Self Care | Payer: Medicaid Other | Source: Ambulatory Visit | Attending: Internal Medicine | Admitting: Internal Medicine

## 2022-08-22 ENCOUNTER — Encounter: Payer: Self-pay | Admitting: Emergency Medicine

## 2022-08-22 ENCOUNTER — Other Ambulatory Visit: Payer: Self-pay

## 2022-08-22 DIAGNOSIS — R110 Nausea: Secondary | ICD-10-CM | POA: Insufficient documentation

## 2022-08-22 DIAGNOSIS — F17290 Nicotine dependence, other tobacco product, uncomplicated: Secondary | ICD-10-CM | POA: Insufficient documentation

## 2022-08-22 DIAGNOSIS — F1910 Other psychoactive substance abuse, uncomplicated: Secondary | ICD-10-CM | POA: Insufficient documentation

## 2022-08-22 DIAGNOSIS — R1011 Right upper quadrant pain: Principal | ICD-10-CM | POA: Diagnosis present

## 2022-08-22 DIAGNOSIS — F17210 Nicotine dependence, cigarettes, uncomplicated: Secondary | ICD-10-CM | POA: Insufficient documentation

## 2022-08-22 DIAGNOSIS — R197 Diarrhea, unspecified: Secondary | ICD-10-CM

## 2022-08-22 DIAGNOSIS — Q631 Lobulated, fused and horseshoe kidney: Secondary | ICD-10-CM | POA: Insufficient documentation

## 2022-08-22 DIAGNOSIS — Z8719 Personal history of other diseases of the digestive system: Secondary | ICD-10-CM | POA: Insufficient documentation

## 2022-08-22 DIAGNOSIS — Z9049 Acquired absence of other specified parts of digestive tract: Secondary | ICD-10-CM | POA: Insufficient documentation

## 2022-08-22 DIAGNOSIS — M797 Fibromyalgia: Secondary | ICD-10-CM | POA: Insufficient documentation

## 2022-08-22 DIAGNOSIS — K760 Fatty (change of) liver, not elsewhere classified: Secondary | ICD-10-CM

## 2022-08-22 DIAGNOSIS — Z8659 Personal history of other mental and behavioral disorders: Secondary | ICD-10-CM | POA: Insufficient documentation

## 2022-08-22 HISTORY — DX: Vitamin D deficiency, unspecified: E55.9

## 2022-08-22 HISTORY — DX: Cocaine abuse, uncomplicated: F14.10

## 2022-08-22 HISTORY — DX: Acute vaginitis: N76.0

## 2022-08-22 HISTORY — DX: Cannabis use, unspecified, uncomplicated: F12.90

## 2022-08-22 HISTORY — DX: Gastrointestinal hemorrhage, unspecified: K92.2

## 2022-08-22 HISTORY — DX: COVID-19: U07.1

## 2022-08-22 LAB — COMPREHENSIVE METABOLIC PANEL
ALT: 8 U/L (ref 0–35)
AST: 20 U/L (ref 0–35)
Albumin: 3.7 g/dL (ref 3.5–5.2)
Alk Phos: 105 U/L (ref 35–105)
Anion Gap: 13 (ref 7–16)
Bilirubin,Total: 0.3 mg/dL (ref 0.0–1.2)
CO2: 23 mmol/L (ref 20–28)
Calcium: 9.1 mg/dL (ref 8.8–10.2)
Chloride: 97 mmol/L (ref 96–108)
Creatinine: 0.54 mg/dL (ref 0.51–0.95)
Glucose: 108 mg/dL — ABNORMAL HIGH (ref 60–99)
Lab: 8 mg/dL (ref 6–20)
Potassium: 3.8 mmol/L (ref 3.3–4.6)
Sodium: 133 mmol/L (ref 133–145)
Total Protein: 8.2 g/dL — ABNORMAL HIGH (ref 6.3–7.7)
eGFR BY CREAT: 126 *

## 2022-08-22 LAB — CBC AND DIFFERENTIAL
Baso # K/uL: 0.1 10*3/uL (ref 0.0–0.2)
Basophil %: 0.3 %
Eos # K/uL: 0.2 10*3/uL (ref 0.0–0.5)
Eosinophil %: 1.2 %
Hematocrit: 40 % (ref 34–49)
Hemoglobin: 13.7 g/dL (ref 11.2–16.0)
IMM Granulocytes #: 0.2 10*3/uL — ABNORMAL HIGH (ref 0.0–0.0)
IMM Granulocytes: 1.2 %
Lymph # K/uL: 3.6 10*3/uL (ref 1.0–5.0)
Lymphocyte %: 23.3 %
MCH: 30 pg (ref 26–32)
MCHC: 34 g/dL (ref 32–36)
MCV: 89 fL (ref 75–100)
Mono # K/uL: 0.6 10*3/uL (ref 0.1–1.0)
Monocyte %: 3.9 %
Neut # K/uL: 10.9 10*3/uL — ABNORMAL HIGH (ref 1.5–6.5)
Nucl RBC # K/uL: 0 10*3/uL (ref 0.0–0.0)
Nucl RBC %: 0 /100 WBC (ref 0.0–0.2)
Platelets: 263 10*3/uL (ref 150–450)
RBC: 4.5 MIL/uL (ref 4.0–5.5)
RDW: 13.1 % (ref 0.0–15.0)
Seg Neut %: 70.1 %
WBC: 15.5 10*3/uL — ABNORMAL HIGH (ref 3.5–11.0)

## 2022-08-22 LAB — LIPASE: Lipase: 21 U/L (ref 13–60)

## 2022-08-22 LAB — PREGNANCY TEST, SERUM: Preg,Serum: NEGATIVE

## 2022-08-22 MED ORDER — DULOXETINE HCL 60 MG PO CPEP *I*
120.0000 mg | DELAYED_RELEASE_CAPSULE | Freq: Every day | ORAL | Status: DC
Start: 2022-08-22 — End: 2022-08-22
  Filled 2022-08-22: qty 2

## 2022-08-22 MED ORDER — TRAZODONE HCL 50 MG PO TABS *I*
50.0000 mg | ORAL_TABLET | Freq: Every evening | ORAL | Status: DC | PRN
Start: 2022-08-22 — End: 2022-08-22

## 2022-08-22 MED ORDER — ACETAMINOPHEN 325 MG PO TABS *I*
650.0000 mg | ORAL_TABLET | Freq: Four times a day (QID) | ORAL | Status: DC | PRN
Start: 2022-08-22 — End: 2022-08-22
  Administered 2022-08-22: 650 mg via ORAL
  Filled 2022-08-22: qty 2

## 2022-08-22 MED ORDER — ONDANSETRON HCL 2 MG/ML IV SOLN *I*
4.0000 mg | Freq: Once | INTRAMUSCULAR | Status: AC
Start: 2022-08-22 — End: 2022-08-22
  Administered 2022-08-22: 4 mg via INTRAVENOUS
  Filled 2022-08-22: qty 2

## 2022-08-22 MED ORDER — FAMOTIDINE (PF) 20 MG/2ML IV SOLN *I*
20.0000 mg | Freq: Once | INTRAVENOUS | Status: AC
Start: 2022-08-22 — End: 2022-08-22
  Administered 2022-08-22: 20 mg via INTRAVENOUS
  Filled 2022-08-22: qty 2

## 2022-08-22 MED ORDER — AMPHETAMINE-DEXTROAMPHETAMINE 5 MG PO TABS *I*
15.0000 mg | ORAL_TABLET | Freq: Two times a day (BID) | ORAL | Status: DC
Start: 2022-08-22 — End: 2022-08-22
  Filled 2022-08-22 (×2): qty 3

## 2022-08-22 MED ORDER — OLANZAPINE 5 MG PO TABS *I*
20.0000 mg | ORAL_TABLET | Freq: Every evening | ORAL | Status: DC
Start: 2022-08-22 — End: 2022-08-22
  Filled 2022-08-22: qty 4

## 2022-08-22 MED ORDER — IOHEXOL 350 MG/ML (OMNIPAQUE) IV SOLN 500ML BOTTLE *I*
1.0000 mL | Freq: Once | INTRAVENOUS | Status: AC
Start: 2022-08-22 — End: 2022-08-22
  Administered 2022-08-22: 150 mL via INTRAVENOUS

## 2022-08-22 MED ORDER — KETOROLAC TROMETHAMINE 15 MG/ML IJ SOLN *I*
15.0000 mg | Freq: Once | INTRAMUSCULAR | Status: AC
Start: 2022-08-22 — End: 2022-08-22
  Administered 2022-08-22: 15 mg via INTRAVENOUS
  Filled 2022-08-22: qty 1

## 2022-08-22 MED ORDER — MORPHINE SULFATE 2 MG/ML IV SOLN *WRAPPED*
4.0000 mg | Freq: Once | Status: AC
Start: 2022-08-22 — End: 2022-08-22
  Administered 2022-08-22: 4 mg via INTRAVENOUS
  Filled 2022-08-22: qty 2

## 2022-08-22 MED ORDER — ACETAMINOPHEN 650 MG RE SUPP *I*
650.0000 mg | Freq: Four times a day (QID) | RECTAL | Status: DC | PRN
Start: 2022-08-22 — End: 2022-08-22

## 2022-08-22 MED ORDER — OLANZAPINE 5 MG PO TABS *I*
20.0000 mg | ORAL_TABLET | Freq: Every evening | ORAL | Status: DC
Start: 2022-08-22 — End: 2022-08-22

## 2022-08-22 MED ORDER — IBUPROFEN 400 MG PO TABS *I*
400.0000 mg | ORAL_TABLET | ORAL | Status: DC | PRN
Start: 2022-08-22 — End: 2022-08-22
  Administered 2022-08-22: 400 mg via ORAL
  Filled 2022-08-22: qty 1

## 2022-08-22 MED ORDER — CLONAZEPAM 0.5 MG PO TABS *I*
0.5000 mg | ORAL_TABLET | Freq: Two times a day (BID) | ORAL | Status: DC | PRN
Start: 2022-08-22 — End: 2022-08-22

## 2022-08-22 MED ORDER — SODIUM CHLORIDE 0.9 % IV BOLUS *I*
1000.0000 mL | Freq: Once | Status: AC
Start: 2022-08-22 — End: 2022-08-22
  Administered 2022-08-22: 1000 mL via INTRAVENOUS

## 2022-08-22 MED ORDER — HYDROMORPHONE HCL PF 1 MG/ML IJ SOLN *WRAPPED*
0.5000 mg | Freq: Once | INTRAMUSCULAR | Status: AC
Start: 2022-08-22 — End: 2022-08-22
  Administered 2022-08-22: 0.5 mg via INTRAVENOUS
  Filled 2022-08-22: qty 0.5

## 2022-08-22 MED ORDER — PRAZOSIN HCL 1 MG PO CAPS *I*
5.0000 mg | ORAL_CAPSULE | Freq: Every evening | ORAL | Status: DC
Start: 2022-08-22 — End: 2022-08-22

## 2022-08-22 MED ORDER — ONDANSETRON 4 MG PO TBDP *I*
4.0000 mg | ORAL_TABLET | Freq: Three times a day (TID) | ORAL | Status: DC | PRN
Start: 2022-08-22 — End: 2022-08-22

## 2022-08-22 MED ORDER — ENOXAPARIN SODIUM 40 MG/0.4ML IJ SOSY *I*
40.0000 mg | PREFILLED_SYRINGE | Freq: Two times a day (BID) | INTRAMUSCULAR | Status: DC
Start: 2022-08-22 — End: 2022-08-22

## 2022-08-22 MED ORDER — PANTOPRAZOLE SODIUM 40 MG PO TBEC *I*
40.0000 mg | DELAYED_RELEASE_TABLET | Freq: Every day | ORAL | Status: DC
Start: 2022-08-22 — End: 2022-08-22
  Administered 2022-08-22: 40 mg via ORAL
  Filled 2022-08-22: qty 1

## 2022-08-22 NOTE — Progress Notes (Signed)
Admission Note     Admitted from: ED    Four-eyed skin assessment completed with: Annabelle Harman RN    Skin WDL

## 2022-08-22 NOTE — Progress Notes (Signed)
Contacts: Patient, Medical Record Review, Medical Provider, and Health Team rounds    Intervention:   Social work to follow for discharge planning    SW made aware pt is being discharged home today. She was requesting assistance with transportation home.    Transportation:  Two (2) gratis [employer] bus passes provided to pt    Plan:   SW to continue to follow up regarding discharge planning. SW to make note of any changes.     Tzion Wedel C. Judie Petit, LMSW  Medical Social Worker  [phone]

## 2022-08-22 NOTE — Progress Notes (Signed)
08/22/22 1600   [facility] Patient Class Review   Patient Class Review Observation       Patient Class effective 08/22/2022

## 2022-08-22 NOTE — ED Triage Notes (Signed)
Patient with right upper ABD pain since 10 pm  Has had nausea and diarrhea, no vomiting  Hx pancreatitis     Prehospital medications given: No

## 2022-08-22 NOTE — ED Provider Progress Notes (Signed)
ED Provider Progress Note    Patient was signed out to myself by Dr. Margarita Rana.  Patient was awaiting a right upper quadrant ultrasound.  Right upper quadrant ultrasound showed no acute disease.  No evidence of biliary dilatation or acute cholecystitis.  Patient's pain reoccurred however.    On exam right upper quadrant abdominal tenderness to palpation, good bowel sounds, soft, nondistended.  Patient appears pale.  Dilaudid and Zofran was ordered for the patient.  Case was discussed with Dr. Marvel Plan from surgery who felt based on the patient's CT scan findings and blood work the symptoms were not consistent with acute cholecystitis.  He felt an upper endoscopy is indicated to further evaluate the abdominal pain.  Case was discussed with the hospitalist and the patient will be placed in observation for management of her abdominal pain and further evaluation.  Pepcid has been ordered.    Diagnosis-right upper quadrant abdominal pain.        Precious Gilchrest Mardelle Matte, MD, 08/22/2022, 9:45 AM     Leeroy Bock, MD  08/22/22 [phone]

## 2022-08-22 NOTE — ED Procedure Documentation (Signed)
Procedures   Ultrasound - Biliary    Date/Time: 08/22/2022 3:01 AM    Performed by: Roger Kill, MD  Authorized by: Roger Kill, MD      Procedure Details:    Indications: RUQ abdominal pain            Assessment For: cholelithiasis and cholecystitis       Structures:    Gallbladder: gallbladder visualized and common bile duct not visualized      Exam Limitations:    body habitus and patient discomfort    Findings:      Gallstones: no     Sludge: no     Pericholecystic Fluid: no    Sonographic Murphy's Sign: yes    Impression:   no gallbladder stones present     no evidence of acute cholecystitis or no overt fluid; positive sonographic Murphy's sign     Images were interpreted by me and archived to [facility] PACS.        Roger Kill, MD     Roger Kill, MD  08/22/22 [phone]

## 2022-08-22 NOTE — Discharge Instructions (Signed)
If you develop a fever, or chills, call your PMD or go to Urgent Care if it is during operating hours.  If not, return to the ED.

## 2022-08-22 NOTE — Bed Hold Note (Signed)
Bed: TED-06  Expected date:   Expected time:   Means of arrival:   Comments:  amb

## 2022-08-22 NOTE — Progress Notes (Signed)
Pharmacist Prior-to-Admission Medication History Review     I reviewed the medication history that was performed by a medication history technician and the list below reflects the best possible prior to admission medication list.     Sources of information:  Patient;Outside Meds Reconciliation ([facility])     This medication reconciliation was completed by pharmacy after the physician completed his/her portion of the med reconciliation.    Pharmacy has obtained home medication information from a variety of sources including the patient's medication list, prescription vials, MARs, third party databases, patient interview, and/or pharmacy/PCP communications.    Recommendation:  Please re-review the patient's home medication list and make the appropriate medication changes to the inpatient list.  The medications needing further reconciliation include, but may not be limited to:    Prior to Admission Medications       Prescriptions Last Dose Informant Patient Reported? Taking?    OLANZapine-samidorphan (LYBALVI) 20-10 MG tablet 08/21/2022  Yes Yes    Take 1 tablet by mouth daily    traZODone (DESYREL) 50 mg tablet   Yes Yes    Take 1-2 tablets (50-100 mg total) by mouth nightly as needed for Sleep    clonazePAM (KLONOPIN) 0.5 mg tablet Past Week  Yes Yes    Take 1 tablet (0.5 mg total) by mouth 2 times daily as needed    ibuprofen (ADVIL,MOTRIN) 600 mg tablet   Yes Yes    Take 1 tablet (600 mg total) by mouth 3 times daily    cetirizine (ZYRTEC) 10 mg tablet   Yes Yes    Take 1 tablet (10 mg total) by mouth daily as needed    omeprazole (PRILOSEC) 20 mg capsule 08/21/2022  Yes Yes    Take 1 capsule (20 mg total) by mouth daily (before breakfast)    amphetamine-dextroamphetamine (ADDERALL XR) 30 mg 24 hr capsule 08/21/2022  Yes Yes    Take 1 capsule (30 mg total) by mouth every morning    prazosin (MINIPRESS) 5 mg capsule 08/21/2022  Yes Yes    Take 1 capsule (5 mg total) by mouth nightly    fluticasone (FLONASE) 50 MCG/ACT  nasal spray 08/21/2022  Yes Yes    Spray 2 sprays into nostril daily  2 sprays into each nostril daily    albuterol HFA (PROVENTIL, VENTOLIN, PROAIR HFA) 108 (90 Base) MCG/ACT inhaler   Yes Yes    Inhale 1-2 puffs into the lungs every 6 hours as needed for Wheezing  Shake well before each use.    DULoxetine (CYMBALTA) 60 MG capsule 08/21/2022  No Yes    Take 2 capsules (120 mg total) by mouth daily    cyclobenzaprine (FLEXERIL) 10 mg tablet Past Week Self Yes Yes    Take 1 tablet (10 mg total) by mouth 3 times daily as needed for Muscle spasms                 Shan Levans, PharmD
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
## 2022-08-22 NOTE — ED Provider Notes (Signed)
History     Chief Complaint   Patient presents with   . Abdominal Pain     32 yo female presents with complaints of right upper quadrant pain with nausea. Started about 10PM. Denies similar int he past; has had pancreatitis before. Worse ot press on or move. History of appendectomy.       History provided by:  Patient  Language interpreter used: No          Medical/Surgical/Family History     Past Medical History:   Diagnosis Date   . Anxiety    . Asthma    . Borderline personality disorder    . 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   . Depression    . Endometriosis 2011   . Fibromyalgia    . Hypothyroid     12/14 denies    . Migraines    . Pancreatitis    . Seizures     last 03/2017 in context abrupt withdrawl from medication   . SVT (supraventricular tachycardia) 2013        Patient Active Problem List   Diagnosis Code   . Calculi, ureter N20.1   . GERD (gastroesophageal reflux disease) K21.9   . Hypothyroid E03.9   . Bronchitis J40   . Pneumonia J18.9   . Anxiety F41.9   . Depression F32.A   . 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 Renal calculi N20.0   . SVT (supraventricular tachycardia) I47.10   . Fibromyalgia M79.7   . Positive ANA (antinuclear antibody) R76.8   . Major depressive disorder, recurrent, moderate F33.1   . PTSD (post-traumatic stress disorder) F43.10            Past Surgical History:   Procedure Laterality Date   . APPENDECTOMY     . BUNIONECTOMY     . COLONOSCOPY     . lithotripsy            Social History     Tobacco Use   . Smoking status: Every Day     Types: Vaping, Cigarettes     Last attempt to quit: 04/18/2021     Years since quitting: 1.3   . Smokeless tobacco: Never   Substance Use Topics   . Alcohol use: Not Currently   . Drug use: Not Currently     Types: Marijuana, Cocaine     Comment:  clean from crack cocaine 07/07/19             Review of Systems   Gastrointestinal:  Positive for abdominal pain, diarrhea and nausea.   All other systems reviewed and are  negative.      Physical Exam     Triage Vitals  Triage Start: Start, (08/22/22 0301)  First Recorded BP: 145/78, Resp: 22, Temp: 35.8 C (96.4 F) Oxygen Therapy SpO2: 96 %, O2 Device: None (Room air), Heart Rate: (!) 117, (08/22/22 0302)  .  First Pain Reported  0-10 Scale: 10, Pain Location/Orientation: Abdomen, (08/22/22 0302)       Physical Exam  Vitals and nursing note reviewed.   Constitutional:       General: She is not in acute distress.     Appearance: She is well-developed. She is obese.   HENT:      Head: Normocephalic and atraumatic.   Eyes:      General:         Right eye: No discharge.  Left eye: No discharge.      Conjunctiva/sclera: Conjunctivae normal.      Pupils: Pupils are equal, round, and reactive to light.   Cardiovascular:      Rate and Rhythm: Normal rate and regular rhythm.   Pulmonary:      Effort: Pulmonary effort is normal. No respiratory distress.      Breath sounds: No stridor.   Abdominal:      General: Bowel sounds are normal. There is no distension.      Palpations: Abdomen is soft.      Tenderness: There is abdominal tenderness in the right upper quadrant. There is guarding. Positive signs include Murphy's sign.   Musculoskeletal:         General: No tenderness. Normal range of motion.      Cervical back: Normal range of motion and neck supple.   Skin:     General: Skin is warm.      Capillary Refill: Capillary refill takes less than 2 seconds.      Findings: No rash.   Neurological:      Mental Status: She is alert and oriented to person, place, and time.      Cranial Nerves: No cranial nerve deficit.   Psychiatric:         Behavior: Behavior normal.         Thought Content: Thought content normal.         Judgment: Judgment normal.       Medical Decision Making   Patient seen by me on:  08/22/2022    Assessment:  32 yo female presents with complaints of RUQ pain with nausea.     Differential diagnosis:  Cholecystitis, pancreatitis, gastritis, non specific abd pain    Plan:   Patient was seen and evaluated. Will obtain lab work and treat symptoms with ketorolac and zofran.     ED Course and Disposition:  Workup in the ED showed:     CBC with leukocytosis of 15.5  CMP benign  Lipase normal    CT scan showed:   Final result by Frankey Poot, MD (08/22/22 06:00:47)             Impression:       No acute abnormality identified within the abdomen or pelvis.    Hepatic steatosis.    Mild splenomegaly.    END OF IMPRESSION          6:07 AM She remains with focal tenderness to palpation in the right upper quadrant.   Will plan to obtain formal ultrasound in the AM; and possible surgical evaluation pending results and her clinical course.     Will sign out to Dr Angela Cox at change of shift.               Roger Kill, MD          Author:  Roger Kill, MD       Roger Kill, MD  08/22/22 [phone]

## 2022-08-22 NOTE — Progress Notes (Signed)
Contacts: Medical Record Review, Care team rounds     Intervention:   Social work to follow for discharge planning     Chart reviewed. Pt was admitted to the hospital (observation) on 08/22/22 s/p right upper quadrant abdominal pain. Pending further work up. No immediate SW needs identified.      Transportation:  Family/Friends  Patient/Family: Allyn Kenner (Mother) [phone]     Plan:   SW to remain available to assist with discharge planning. SW to make note of any changes.     Jazon Jipson C. Judie Petit, LMSW  Medical Social Worker  [phone]

## 2022-09-09 ENCOUNTER — Encounter: Payer: Self-pay | Admitting: Gastroenterology

## 2022-09-12 ENCOUNTER — Encounter: Payer: Self-pay | Admitting: Gastroenterology

## 2022-10-10 ENCOUNTER — Encounter: Payer: Self-pay | Admitting: Gastroenterology

## 2022-10-14 ENCOUNTER — Telehealth: Payer: Self-pay

## 2022-10-14 ENCOUNTER — Encounter: Payer: Self-pay | Admitting: Gastroenterology

## 2022-10-14 NOTE — Telephone Encounter (Signed)
Pt called to check the status of her referral I let her know we had not gotten that referral yet.

## 2022-10-19 ENCOUNTER — Ambulatory Visit: Payer: Medicaid Other | Admitting: Gastroenterology

## 2022-10-25 ENCOUNTER — Telehealth: Payer: Self-pay

## 2022-10-25 NOTE — Telephone Encounter (Signed)
TC from Dalia @ Dr.Blume's office.  States referral has been faxed x 2 and asking if we have received it.  I didn't see it in pt scheduling and will forward to clerical to investigate.  Please call Dalia back @ [phone] to let her know if we have received it.

## 2022-10-26 NOTE — Telephone Encounter (Signed)
Returned Casey Hughes's call and let her know we have the referral and we are booking out about a year

## 2022-10-27 ENCOUNTER — Ambulatory Visit: Payer: Medicaid Other | Admitting: Rehabilitative and Restorative Service Providers"

## 2022-10-27 NOTE — Progress Notes (Deleted)
Sent via: Fax    Physician attestation for Plan of Care: Physician:  Bethanne Ginger,*  Per signature, I have reviewed and agree with the documented plan of care.    Signature: ____________________________________________________  ____________________________________________________________  Plan of Care     The physician's co-signature on this note indicates that they have reviewed this evaluation and agree with the documented goals and plan of care.          [facility]  Part of [facility]  Lumbar Spine Initial Evaluation      Name: Casey Hughes  DOB: December 28, 1990  Referring Physician: Bethanne Ginger,*  Today's Date: 10/27/2022  Visit #: Visit count could not be calculated. Make sure you are using a visit which is associated with an episode.    Diagnosis: No diagnosis found.    Subjective    Casey Hughes is a 32 y.o. female who is present today for lumbar spine care. ***     Onset date: ***  Mechanism of injury/history of symptoms:  ***  Symptoms worsen with: {Worsening symptoms:26765}  Symptoms improve with: {SYMPTOMS; Sports/Hand Improve:24295}  Prior therapy: ***  Diagnostic tests: ***  Date of surgery: ***    Pain Assessment    Pain Location Current  Best Worst  Acceptable   ***                Symptom description: ***  Symptom frequency: {Desc; intermittent/persistent/constant:12478}  Relevant Red Flags: {Relevant Red Flags:24846}     Social History   Living situation and Home Environment: ***  Prior level of function: ***  Pt. Stated goals: ***    Functional Activity  Current  Limitation    Sit    Sit to Stand    Stand    Walk    Sleep through the night          Occupation and Activities  Work status: {MISC; WORK ZOXWRU:04540}  Job title/type of work: {Type of [employer]  Stresses/physical demands of job: {Physical demands o fJob:26759}  Sport(s)/Hobbies: ***    Past Medical History   Surgical history:   Past Surgical History:    Procedure Laterality Date    APPENDECTOMY      BUNIONECTOMY      lithotripsy         Current Medications: ***  Relevant Comorbidities/Personal Factors:    Past Medical History:   Diagnosis Date    Anxiety     Asthma     Borderline personality disorder     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    Depression     Endometriosis 2011    Fibromyalgia     GERD (gastroesophageal reflux disease) 02/16/2010    Migraines     Positive ANA (antinuclear antibody) 06/13/2017    PTSD (post-traumatic stress disorder) 05/14/2018    Seizures     last 03/2017 in context abrupt withdrawl from medication    SVT (supraventricular tachycardia) 2013     These are relevant factors as they: may be a contraindication to certain exercises, may affect a Pt's motivation and response to therapeutic interventions, may affect a Pt's ability to exercise and response exercise, and may affect tissue healing response, thus impacting treatment outcome and plan of care.      Outcome Measurement   Oswestry ***%   of perceived disability    Objective  Blood pressure: ***  Heart Rate: ***  SpO2: ***  Posture: {Spine Posture:24286}  Incision:  {incision:27904}  Gait ***    Neurological:  Myotomes MMT x/5, * denotes pain   Right Left   L1,2 - Psoas ***    L3 - Quadriceps     L4 - Anterior Tibialis     L5 - EHL/Peroneals     S1, 2 - Gastroc/Hamstrings       Dermatomes   Right Left   L1 {Dermatome Status:23413} {Dermatome Status:23413}   L2 {Dermatome Status:23413} {Dermatome Status:23413}   L3 {Dermatome Status:23413} {Dermatome Status:23413}   L4 {Dermatome Status:23413} {Dermatome Status:23413}   L5 {Dermatome Status:23413} {Dermatome Status:23413}   S1 {Dermatome Status:23413} {Dermatome Status:23413}     Reflexes   Right Left   L3 - Patellar {REFLEXES:21338} {REFLEXES:21338}   S1 - Achilles {REFLEXES:21338} {REFLEXES:21338}     Palpation:  Positive tenderness with palpation of ***  Negative tenderness with palpation of ***    Lumbar Spine  AROM Unit of Measure   Flexion *** {Units of measurement:24867}   Extension  {Units of measurement:24867}   Right Sidebending  {Units of measurement:24867}   Left Sidebending  {Units of measurement:24867}   Right Rotation  {Units of measurement:24867}   Left Rotation  {Units of measurement:24867}     Lumbar Special Testing:  {Lumbar Spine Special Testing:23400}    Lumbar Joint Mobility    Joint Force Direction Grade End Feel Symptoms   L1 ***      L2       L3       L4       L5       Sacroiliac Joint          Repeated Motions: Prior signs and symptoms ***  Movement Rep Response Comment   ***                                  HIP LEFT RIGHT   ROM (deg) AROM PROM AROM PROM   Flexion       Extension        Abduction        IR        ER       Strength (***)   Gluteus Maximus      Gluteus Medius      Hip Flexors     Adductors     Internal Rotators     External Rotators      Quadriceps     Hamstrings     *denotes pain     Hip Special Tests: {Hip Test:27656}     Assessment:  Findings consistent with 32 y.o., {female/female:29594} with ***.    A history with {Evaluation Complexity History:23550} that impact the plan of care.  An examination of body system(s) using standardized test and measures addressing {Evaluation Complexity Exam:23551} from any of the following: body structures and functions, activity limitations and/or participation restrictions.  A clinical presentation with {Evaluation Complexity clinical presenation:23557} characteristics.  A {Evaluation Complexity:23561} evaluation was performed today.    Prognosis:  {good/fair/poor:27869}  Contraindications/Precautions/Limitation:  {Contraindications/precautions:23709}    Goal Length Status   Patient will be able to reduce and manage symptoms independently  {goal length:23396} New   Patient will be able to demonstrate proper posture and body mechanics with lifting and bending activities  {goal length:23396} New   *** {goal length:23396} New   *** {goal length:23396}  New   Patient will be independent and compliant  with HEP {goal length:23396} New   *** {goal length:23396} New   *** {goal length:23396} New   Patient will decrease Modified Oswestry to less than *** to show significant improvement in function and ability. {goal length:23396} New     Treatment Plan:   Anticipated plan of care reviewed with patient and/or family:  {YES ON:22742}  Freq {NUMBERS 0-7:27905} times per {Time; day/week/month:13537} for {NUMBERS 0-7:20272} {MONTHS/WEEKS/DAYS:23338}    Treatment plan inclusive of:   Exercise: {Exercise:23697}   Manual Techniques:  {Manual Techniques:23698}   Modalities: {Modalities:23405}    Functional: {Functional:23712}   CPTs: {Rehab CPT:28225}    Treatment today consisted of:    Treatment: Details   THERAPEUTIC EXERCISE ***               MANUAL            NEUROMUSCULAR RE-ED            THERAPEUTIC ACTIVITY            GAIT        MODALITIES            OTHER          Thank you for referring this patient to [facility].    Danella Maiers Jaymere Alen, PT,DPT       Time Reporting Minutes   Service-Based Procedures/ Modalities    Evaluation (high, moderate, low)    Re-evaluation    Traction, mechanical    Electric stimulation (unattended)    Total service-based billable procedures        Time-Based  Procedures / Modalities    Therapeutic ex    Neuromuscular Re-ed    Manual Therapy    Therapeutic Activities    Gait training, including stairs    Ultrasound    Electrical Stimulation    Iontophoresis    Physical Performance Test    Aquatic Therapy    Total Time-Based Billable Procedures        Total Skilled Treatment Time

## 2022-11-02 ENCOUNTER — Encounter: Payer: Self-pay | Admitting: Gastroenterology

## 2022-11-04 ENCOUNTER — Telehealth: Payer: Self-pay

## 2022-11-04 NOTE — Telephone Encounter (Signed)
Called Patient was unable to leave a message. Referral from [facility] for OSA consult. Notes sent to scanning

## 2022-11-09 ENCOUNTER — Ambulatory Visit: Payer: Medicaid Other | Admitting: Gastroenterology

## 2022-12-13 ENCOUNTER — Telehealth: Payer: Self-pay

## 2022-12-13 NOTE — Telephone Encounter (Signed)
Spoke with Casey Hughes who stated that she would need to reschedule her appt on 12/14/22.   Please call to reschedule.

## 2022-12-13 NOTE — Telephone Encounter (Signed)
Tried to call patient to reschedule appointment on 12/14/2022. Voicemail box is not set-up to leave messages.

## 2022-12-14 ENCOUNTER — Ambulatory Visit: Payer: Medicaid Other | Admitting: Gastroenterology

## 2022-12-14 ENCOUNTER — Telehealth: Payer: Self-pay | Admitting: Gastroenterology

## 2022-12-14 NOTE — Telephone Encounter (Addendum)
Attepmted to LVM for pt Casey Hughes  however no vm as been set up yet.     No-showed to appt today 5-22 @ 9:20am    [employer] message as well.

## 2023-01-06 ENCOUNTER — Telehealth: Payer: Self-pay

## 2023-01-06 NOTE — Telephone Encounter (Signed)
Tried to call patient to schedule NPV, MB not set up. Sent MyChart

## 2023-01-09 NOTE — Telephone Encounter (Signed)
Tried to call patient to schedule NPV, MB not set up. 2nd attempt

## 2023-01-18 ENCOUNTER — Other Ambulatory Visit
Admission: RE | Admit: 2023-01-18 | Discharge: 2023-01-18 | Disposition: A | Discharge status: Home / Self Care | Payer: Medicaid Other | Source: Ambulatory Visit | Attending: Registered Nurse | Admitting: Registered Nurse

## 2023-01-18 ENCOUNTER — Other Ambulatory Visit: Payer: Self-pay | Admitting: Gastroenterology

## 2023-01-18 DIAGNOSIS — F410 Panic disorder [episodic paroxysmal anxiety] without agoraphobia: Secondary | ICD-10-CM | POA: Insufficient documentation

## 2023-01-18 DIAGNOSIS — Z79899 Other long term (current) drug therapy: Secondary | ICD-10-CM | POA: Insufficient documentation

## 2023-01-18 LAB — CBC AND DIFFERENTIAL
Baso # K/uL: 0 10*3/uL (ref 0.0–0.2)
Eos # K/uL: 0.2 10*3/uL (ref 0.0–0.5)
Hematocrit: 42 % (ref 34–49)
Hemoglobin: 13.7 g/dL (ref 11.2–16.0)
IMM Granulocytes #: 0.1 10*3/uL — ABNORMAL HIGH (ref 0.0–0.0)
IMM Granulocytes: 0.7 %
Lymph # K/uL: 2.7 10*3/uL (ref 1.0–5.0)
MCV: 90 fL (ref 75–100)
Mono # K/uL: 0.6 10*3/uL (ref 0.1–1.0)
Neut # K/uL: 9 10*3/uL — ABNORMAL HIGH (ref 1.5–6.5)
Nucl RBC # K/uL: 0 10*3/uL (ref 0.0–0.0)
Nucl RBC %: 0 /100 WBC (ref 0.0–0.2)
Platelets: 287 10*3/uL (ref 150–450)
RBC: 4.7 MIL/uL (ref 4.0–5.5)
RDW: 13.9 % (ref 0.0–15.0)
Seg Neut %: 71.4 %
WBC: 12.6 10*3/uL — ABNORMAL HIGH (ref 3.5–11.0)

## 2023-01-18 LAB — LIPID PANEL
Chol/HDL Ratio: 7.2
Cholesterol: 186 mg/dL
HDL: 26 mg/dL — ABNORMAL LOW (ref 40–60)
LDL Calculated: 101 mg/dL
Non HDL Cholesterol: 160 mg/dL
Triglycerides: 293 mg/dL — AB

## 2023-01-18 LAB — COMPREHENSIVE METABOLIC PANEL
ALT: 10 U/L (ref 0–35)
AST: 14 U/L (ref 0–35)
Albumin: 3.7 g/dL (ref 3.5–5.2)
Alk Phos: 94 U/L (ref 35–105)
Anion Gap: 12 (ref 7–16)
Bilirubin,Total: 0.2 mg/dL (ref 0.0–1.2)
CO2: 25 mmol/L (ref 20–28)
Calcium: 8.9 mg/dL (ref 8.8–10.2)
Chloride: 101 mmol/L (ref 96–108)
Creatinine: 0.59 mg/dL (ref 0.51–0.95)
Glucose: 99 mg/dL (ref 60–99)
Lab: 8 mg/dL (ref 6–20)
Potassium: 4.2 mmol/L (ref 3.3–4.6)
Sodium: 138 mmol/L (ref 133–145)
Total Protein: 7.5 g/dL (ref 6.3–7.7)
eGFR BY CREAT: 123 *

## 2023-01-18 LAB — VITAMIN D: 25-OH Vit Total: 8 ng/mL — ABNORMAL LOW (ref 30–60)

## 2023-01-18 LAB — T4, FREE: Free T4: 1.3 ng/dL (ref 0.9–1.7)

## 2023-01-18 LAB — UNABLE TO VOID

## 2023-01-18 LAB — TSH: TSH: 1.55 u[IU]/mL (ref 0.27–4.20)

## 2023-01-26 ENCOUNTER — Encounter: Payer: Self-pay | Admitting: Emergency Medicine

## 2023-01-26 ENCOUNTER — Other Ambulatory Visit: Payer: Self-pay

## 2023-01-26 ENCOUNTER — Ambulatory Visit: Payer: Medicaid Other | Attending: Emergency Medicine | Admitting: Emergency Medicine

## 2023-01-26 VITALS — BP 140/95 | HR 106 | Temp 96.2°F | Resp 22

## 2023-01-26 DIAGNOSIS — R220 Localized swelling, mass and lump, head: Secondary | ICD-10-CM

## 2023-01-26 DIAGNOSIS — K047 Periapical abscess without sinus: Secondary | ICD-10-CM

## 2023-01-26 DIAGNOSIS — K0889 Other specified disorders of teeth and supporting structures: Secondary | ICD-10-CM

## 2023-01-26 MED ORDER — CLINDAMYCIN HCL 300 MG PO CAPS *I*
300.0000 mg | ORAL_CAPSULE | Freq: Three times a day (TID) | ORAL | 0 refills | Status: AC
Start: 2023-01-26 — End: 2023-02-05

## 2023-01-26 NOTE — Patient Instructions (Signed)
Warm compresses - tylenol and ibuprofen for pain and fever -- clindamycin as prescribed- probiotics while on antibiotics - follow up with dentist - - if worsening pain or swelling -go to emergency

## 2023-01-26 NOTE — UC Provider Note (Signed)
History     Chief Complaint   Patient presents with    Dental Pain     Pt states that since yesterday morning she started with swelling and pain in her left upper jaw from a tooth.     Pt comes to uc for dental pain - left upper - facial swelling - broken tooth per pt         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    Cocaine abuse     positive test in 2020    COVID-19     03/2021    Depression     Endometriosis 2011    Fibromyalgia     GERD (gastroesophageal reflux disease) 02/16/2010    Horseshoe kidney 02/16/2010    Hypothyroid     12/14 denies     Lower GI bleed     colonoscopy revealed internal hemorrhoids as the source. 03/2017.    Major depressive disorder, recurrent, moderate 02/09/2018    Marijuana use     numerous positive tests from 2019-2023    Migraines     Positive ANA (antinuclear antibody) 06/13/2017    PTSD (post-traumatic stress disorder) 05/14/2018    Seizures     last 03/2017 in context abrupt withdrawl from medication    SVT (supraventricular tachycardia) 2013    Vaginitis     multiple positive tests for gardeneralla    Vitamin D deficiency         Patient Active Problem List   Diagnosis Code    Calculi, ureter N20.1    GERD (gastroesophageal reflux disease) K21.9    Hypothyroid E03.9    Bronchitis J40    Pneumonia J18.9    Anxiety F41.9    Depression F32.A    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    Renal calculi N20.0    SVT (supraventricular tachycardia) I47.10    Fibromyalgia M79.7    Positive ANA (antinuclear antibody) R76.8    Major depressive disorder, recurrent, moderate F33.1    PTSD (post-traumatic stress disorder) F43.10    Right upper quadrant abdominal pain R10.11            Past Surgical History:   Procedure Laterality Date    APPENDECTOMY      BUNIONECTOMY      lithotripsy       Family History   Problem Relation Age of Onset    Anxiety disorder  Mother     Arthritis Mother     Cancer Mother     Depression Mother     Emphysema Mother     Arthritis Father           Social History     Tobacco Use    Smoking status: Every Day     Types: Vaping, Cigarettes     Last attempt to quit: 04/18/2021     Years since quitting: 1.7    Smokeless tobacco: Never   Substance Use Topics    Alcohol use: Not Currently    Drug use: Not Currently     Types: Marijuana, Cocaine     Comment:  clean from crack cocaine 07/07/19     Living Situation       Questions Responses    Patient lives with Other(comment)    Comment: Reports being  homeless - stays at [facility] Yes    Caregiver for other family member No    External Services Mental Health Services    Comment: [employer]     Employment Unemployed    Comment: [employer]      Domestic Violence Risk No                  Review of Systems   Review of Systems   Constitutional:  Negative for chills and fever.   HENT:  Positive for dental problem and facial swelling (left).        Physical Exam   Vitals     First Recorded BP: (!) 140/95, Resp: 22, Temp: 35.7 C (96.2 F), Temp src: TEMPORAL Oxygen Therapy SpO2: 94 %, Heart Rate: 106, (01/26/23 1043)  .      Physical Exam  Vitals and nursing note reviewed.   HENT:      Head:      Comments: Left facial swelling - mild tenderness      Mouth/Throat:      Mouth: Mucous membranes are moist.      Pharynx: Posterior oropharyngeal erythema (left upper gum line - facial swelling left - broken tooth) present.   Musculoskeletal:         General: Normal range of motion.   Skin:     General: Skin is warm and dry.   Neurological:      General: No focal deficit present.      Mental Status: She is alert.          Medical Decision Making   Medical Decision Making  Assessment:    Pt comes to uc for left facial swelling - dental pain - suspect infection     Differential diagnosis:    Dental infection - pain - abscess     Plan and Results:    Clindamycin - symptomatic tx - follow up with dentist- go to  emergency if not improving or worsening         Diagnosis and Disposition:   Dental pain -infection  - facial swelling  - home     Return precautions discussed and provided on AVS.    Discharge Medications  Start Taking             clindamycin (CLEOCIN) 300 mg capsule Take 1 capsule (300 mg total) by   mouth every 8 hours for 10 days for dental infection.            Follow-up  No follow-ups on file.            Final Diagnosis    ICD-10-CM ICD-9-CM   1. Dental infection  K04.7 522.4   2. Pain, dental  K08.89 525.9   3. Facial swelling  R22.0 784.2         Camryn Quesinberry Starleen Blue, MD

## 2023-03-26 ENCOUNTER — Emergency Department
Admission: EM | Admit: 2023-03-26 | Discharge: 2023-03-27 | Disposition: A | Discharge status: Home / Self Care | Payer: Medicaid Other | Source: Ambulatory Visit | Attending: Emergency Medicine | Admitting: Emergency Medicine

## 2023-03-26 DIAGNOSIS — J45901 Unspecified asthma with (acute) exacerbation: Secondary | ICD-10-CM | POA: Insufficient documentation

## 2023-03-26 DIAGNOSIS — Z1152 Encounter for screening for COVID-19: Secondary | ICD-10-CM | POA: Insufficient documentation

## 2023-03-26 DIAGNOSIS — J40 Bronchitis, not specified as acute or chronic: Secondary | ICD-10-CM

## 2023-03-26 DIAGNOSIS — R000 Tachycardia, unspecified: Secondary | ICD-10-CM | POA: Insufficient documentation

## 2023-03-26 DIAGNOSIS — Z20822 Contact with and (suspected) exposure to covid-19: Secondary | ICD-10-CM

## 2023-03-26 DIAGNOSIS — E669 Obesity, unspecified: Secondary | ICD-10-CM

## 2023-03-26 DIAGNOSIS — F17210 Nicotine dependence, cigarettes, uncomplicated: Secondary | ICD-10-CM | POA: Insufficient documentation

## 2023-03-26 NOTE — ED Triage Notes (Signed)
Patient presents with 3 days of shortness of breath and cough. Patient denies chest pain.    Prehospital medications given: No

## 2023-03-27 ENCOUNTER — Emergency Department: Payer: Medicaid Other

## 2023-03-27 DIAGNOSIS — R059 Cough, unspecified: Secondary | ICD-10-CM

## 2023-03-27 DIAGNOSIS — I213 ST elevation (STEMI) myocardial infarction of unspecified site: Secondary | ICD-10-CM

## 2023-03-27 DIAGNOSIS — Z789 Other specified health status: Secondary | ICD-10-CM

## 2023-03-27 DIAGNOSIS — R0602 Shortness of breath: Secondary | ICD-10-CM

## 2023-03-27 LAB — CBC AND DIFFERENTIAL
Baso # K/uL: 0 10*3/uL (ref 0.0–0.2)
Eos # K/uL: 0.3 10*3/uL (ref 0.0–0.5)
Hematocrit: 41 % (ref 34–49)
Hemoglobin: 13.3 g/dL (ref 11.2–16.0)
IMM Granulocytes #: 0.1 10*3/uL — ABNORMAL HIGH (ref 0.0–0.0)
IMM Granulocytes: 0.9 %
Lymph # K/uL: 2.3 10*3/uL (ref 1.0–5.0)
MCV: 90 fL (ref 75–100)
Mono # K/uL: 0.5 10*3/uL (ref 0.1–1.0)
Neut # K/uL: 5.5 10*3/uL (ref 1.5–6.5)
Nucl RBC # K/uL: 0 10*3/uL (ref 0.0–0.0)
Nucl RBC %: 0 /100 WBC (ref 0.0–0.2)
Platelets: 252 10*3/uL (ref 150–450)
RBC: 4.6 MIL/uL (ref 4.0–5.5)
RDW: 14.4 % (ref 0.0–15.0)
Seg Neut %: 63.7 %
WBC: 8.7 10*3/uL (ref 3.5–11.0)

## 2023-03-27 LAB — URINALYSIS WITH MICROSCOPIC
Glucose,UA: NEGATIVE
Ketones, UA: NEGATIVE
Nitrite,UA: NEGATIVE
Protein,UA: NEGATIVE
Specific Gravity,UA: 1.027 (ref 1.002–1.030)
pH,UA: 6 (ref 5.0–8.0)

## 2023-03-27 LAB — COVID/INFLUENZA A & B/RSV NAAT (PCR)
COVID-19 NAAT (PCR): NEGATIVE
Influenza A NAAT (PCR): NEGATIVE
Influenza B NAAT (PCR): NEGATIVE
RSV NAAT (PCR): NEGATIVE

## 2023-03-27 LAB — COMPREHENSIVE METABOLIC PANEL
ALT: 17 U/L (ref 0–35)
AST: 25 U/L (ref 0–35)
Albumin: 2.7 g/dL — ABNORMAL LOW (ref 3.5–5.2)
Alk Phos: 86 U/L (ref 35–105)
Anion Gap: 9 (ref 7–16)
Bilirubin,Total: <0.2 mg/dL (ref 0.0–1.2)
CO2: 22 mmol/L (ref 20–28)
Calcium: 6.8 mg/dL — ABNORMAL LOW (ref 8.8–10.2)
Chloride: 111 mmol/L — ABNORMAL HIGH (ref 96–108)
Creatinine: 0.47 mg/dL — ABNORMAL LOW (ref 0.51–0.95)
Glucose: 105 mg/dL — ABNORMAL HIGH (ref 60–99)
Lab: <4 mg/dL — ABNORMAL LOW (ref 6–20)
Potassium: 2.8 mmol/L — CL (ref 3.3–4.6)
Sodium: 142 mmol/L (ref 133–145)
Total Protein: 5.9 g/dL — ABNORMAL LOW (ref 6.3–7.7)
eGFR BY CREAT: 130 *

## 2023-03-27 LAB — TROPONIN T 1 HR W/ DELTA HIGH SENSITIVITY: TROP T 1 HR High Sensitivity: <6 ng/L (ref 0–11)

## 2023-03-27 LAB — BASIC METABOLIC PANEL
Anion Gap: 8 (ref 7–16)
CO2: 25 mmol/L (ref 20–28)
Calcium: 8.1 mg/dL — ABNORMAL LOW (ref 8.8–10.2)
Chloride: 107 mmol/L (ref 96–108)
Creatinine: 0.54 mg/dL (ref 0.51–0.95)
Glucose: 145 mg/dL — ABNORMAL HIGH (ref 60–99)
Lab: 5 mg/dL — ABNORMAL LOW (ref 6–20)
Potassium: 3.5 mmol/L (ref 3.3–4.6)
Sodium: 140 mmol/L (ref 133–145)
eGFR BY CREAT: 126 *

## 2023-03-27 LAB — TROPONIN T 0 HR HIGH SENSITIVITY (IP/ED ONLY): TROP T 0 HR High Sensitivity: <6 ng/L (ref 0–11)

## 2023-03-27 LAB — NT-PRO BNP: NT-pro BNP: <50 pg/mL (ref 0–450)

## 2023-03-27 LAB — PROCALCITONIN: Procalcitonin: 0.06 ng/mL (ref 0.00–0.08)

## 2023-03-27 LAB — PREGNANCY TEST, SERUM: Preg,Serum: NEGATIVE

## 2023-03-27 LAB — PERFORMING LAB

## 2023-03-27 MED ORDER — DOXYCYCLINE HYCLATE 100 MG PO TABS *I*
100.0000 mg | ORAL_TABLET | Freq: Two times a day (BID) | ORAL | 0 refills | Status: DC
Start: 2023-03-27 — End: 2023-03-27

## 2023-03-27 MED ORDER — METHYLPREDNISOLONE SOD SUCC 125 MG IJ SOLR(62.5MG/ML) *WRAPPED*
125.0000 mg | Freq: Once | INTRAMUSCULAR | Status: AC
Start: 2023-03-27 — End: 2023-03-27
  Administered 2023-03-27: 125 mg via INTRAVENOUS
  Filled 2023-03-27: qty 2

## 2023-03-27 MED ORDER — BUDESONIDE-FORMOTEROL FUMARATE 160-4.5 MCG/ACT IN AERO *I*
2.0000 | INHALATION_SPRAY | Freq: Two times a day (BID) | RESPIRATORY_TRACT | 0 refills | Status: AC
Start: 2023-03-27 — End: 2023-04-26

## 2023-03-27 MED ORDER — ACETAMINOPHEN 500 MG PO TABS *I*
1000.0000 mg | ORAL_TABLET | Freq: Once | ORAL | Status: AC
Start: 2023-03-27 — End: 2023-03-27
  Administered 2023-03-27: 1000 mg via ORAL
  Filled 2023-03-27: qty 2

## 2023-03-27 MED ORDER — CALCIUM CARBONATE ANTACID 500 MG PO CHEW *I*
1000.0000 mg | CHEWABLE_TABLET | Freq: Once | ORAL | Status: AC
Start: 2023-03-27 — End: 2023-03-27
  Administered 2023-03-27: 1000 mg via ORAL
  Filled 2023-03-27: qty 2

## 2023-03-27 MED ORDER — CALCIUM GLUCONATE 9.4 MEQ (2,000 MG) IN NS 110 ML *I*
9.4000 meq | Freq: Once | INTRAVENOUS | Status: AC
Start: 2023-03-27 — End: 2023-03-27
  Administered 2023-03-27: 9.4 meq via INTRAVENOUS
  Filled 2023-03-27: qty 110

## 2023-03-27 MED ORDER — PREDNISONE 20 MG PO TABS *I*
40.0000 mg | ORAL_TABLET | Freq: Every day | ORAL | 0 refills | Status: AC
Start: 2023-03-27 — End: 2023-03-31

## 2023-03-27 MED ORDER — SODIUM CHLORIDE 0.9 % IV BOLUS *I*
1000.0000 mL | Freq: Once | Status: AC
Start: 2023-03-27 — End: 2023-03-27
  Administered 2023-03-27: 1000 mL via INTRAVENOUS

## 2023-03-27 MED ORDER — IPRATROPIUM-ALBUTEROL 0.5-2.5 MG/3ML IN SOLN *I*
3.0000 mL | Freq: Once | RESPIRATORY_TRACT | Status: AC
Start: 2023-03-27 — End: 2023-03-27
  Administered 2023-03-27: 3 mL via RESPIRATORY_TRACT
  Filled 2023-03-27: qty 3

## 2023-03-27 MED ORDER — IPRATROPIUM-ALBUTEROL 0.5-2.5 MG/3ML IN SOLN *I*
6.0000 mL | Freq: Once | RESPIRATORY_TRACT | Status: AC
Start: 2023-03-27 — End: 2023-03-27
  Administered 2023-03-27: 6 mL via RESPIRATORY_TRACT
  Filled 2023-03-27: qty 6

## 2023-03-27 MED ORDER — CEFTRIAXONE SODIUM 1 G IN STERILE WATER 10ML SYRINGE *I*
1000.0000 mg | Freq: Once | INTRAVENOUS | Status: DC
Start: 2023-03-27 — End: 2023-03-27
  Filled 2023-03-27: qty 10

## 2023-03-27 MED ORDER — ONDANSETRON 4 MG PO TBDP *I*
4.0000 mg | ORAL_TABLET | Freq: Once | ORAL | Status: AC
Start: 2023-03-27 — End: 2023-03-27
  Administered 2023-03-27: 4 mg via ORAL
  Filled 2023-03-27: qty 1

## 2023-03-27 MED ORDER — IOHEXOL 350 MG/ML (OMNIPAQUE) IV SOLN 500ML BOTTLE *I*
1.0000 mL | Freq: Once | INTRAVENOUS | Status: AC
Start: 2023-03-27 — End: 2023-03-27
  Administered 2023-03-27: 70 mL via INTRAVENOUS

## 2023-03-27 MED ORDER — SODIUM CHLORIDE 0.9 % 250 ML IV SOLN *I*
100.0000 mg | Freq: Once | INTRAVENOUS | Status: DC
Start: 2023-03-27 — End: 2023-03-27
  Filled 2023-03-27: qty 10

## 2023-03-27 MED ORDER — AEROCHAMBER Z-STAT PLUS/SMALL MISC *A*
0 refills | Status: AC
Start: 2023-03-27 — End: ?

## 2023-03-27 MED ORDER — IBUPROFEN 600 MG PO TABS *I*
600.0000 mg | ORAL_TABLET | Freq: Once | ORAL | Status: AC
Start: 2023-03-27 — End: 2023-03-27
  Administered 2023-03-27: 600 mg via ORAL
  Filled 2023-03-27: qty 1

## 2023-03-27 MED ORDER — GUAIFENESIN-DEXTROMETHORPHAN 100-10 MG/5 ML PO SYRP/LIQD WRAPPED *I*
10.0000 mL | ORAL_SOLUTION | Freq: Once | ORAL | Status: AC
Start: 2023-03-27 — End: 2023-03-27
  Administered 2023-03-27: 10 mL via ORAL
  Filled 2023-03-27: qty 10

## 2023-03-27 MED ORDER — CEFPODOXIME PROXETIL 200 MG PO TABS *I*
200.0000 mg | ORAL_TABLET | Freq: Two times a day (BID) | ORAL | 0 refills | Status: DC
Start: 2023-03-27 — End: 2023-03-27

## 2023-03-27 MED ORDER — AEROCHAMBER Z-STAT PLUS/SMALL MISC *A*
0 refills | Status: DC
Start: 2023-03-27 — End: 2023-03-27

## 2023-03-27 MED ORDER — ALBUTEROL SULFATE HFA 108 (90 BASE) MCG/ACT IN AERS *I*
1.0000 | INHALATION_SPRAY | Freq: Four times a day (QID) | RESPIRATORY_TRACT | 0 refills | Status: AC | PRN
Start: 2023-03-27 — End: 2023-04-26

## 2023-03-27 MED ORDER — POTASSIUM CHLORIDE CR 10 MEQ PO CPCR *I*
40.0000 meq | ORAL_CAPSULE | Freq: Once | ORAL | Status: AC
Start: 2023-03-27 — End: 2023-03-27
  Administered 2023-03-27: 40 meq via ORAL
  Filled 2023-03-27: qty 4

## 2023-03-27 NOTE — Discharge Instructions (Addendum)
Your workup tonight is most consistent with bronchitis and an asthma exacerbation. I am starting you on steroid (prednisone) burst along with albuterol, which you should use with the spacer up to every 4 hours as needed for wheezing.  In addition, I am prescribing Symbicort, a combination extended-action inhaled steroid and bronchodilator, which should help with your breathing while you recover from this likely-viral infection.    Call your PCP first thing Tuesday morning to schedule follow up as soon as possible.    If your symptoms are not beginning to improve over the next 1-2 days, you should return to the ED for re-evaluation.

## 2023-03-27 NOTE — ED Provider Notes (Signed)
History     Chief Complaint   Patient presents with   . Shortness of Breath   . Cough     32 y.o. F p/w 2 days of worsening cough, SOB, chest pain (mostly w/ cough), and painful small volume incontinence when coughing.  Hx cocaine abuse, svt, endometriosis, horseshoe kidney, fibromyalgia, MDD, borderline personality disorder, PTSD, ANA positive.  No known sick contacts. No recent travel.  Pt denies dysuria when urinating aside from the incontinence with coughing, also denies urinary urgency/frequency.  Pt reports poor PO intake x1-2 days due to nausea, but no vomiting.  She reports past history of asthma as a child, but has not had breathing issues in many years.        Medical/Surgical/Family History     Past Medical History:   Diagnosis Date   . Anxiety    . Asthma    . Borderline personality disorder    . 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   . Cocaine abuse     positive test in 2020   . COVID-19     03/2021   . Depression    . Endometriosis 2011   . Fibromyalgia    . GERD (gastroesophageal reflux disease) 02/16/2010   . Horseshoe kidney 02/16/2010   . Hypothyroid     12/14 denies    . Lower GI bleed     colonoscopy revealed internal hemorrhoids as the source. 03/2017.   . Major depressive disorder, recurrent, moderate 02/09/2018   . Marijuana use     numerous positive tests from 2019-2023   . Migraines    . Positive ANA (antinuclear antibody) 06/13/2017   . PTSD (post-traumatic stress disorder) 05/14/2018   . Seizures     last 03/2017 in context abrupt withdrawl from medication   . SVT (supraventricular tachycardia) 2013   . Vaginitis     multiple positive tests for gardeneralla   . Vitamin D deficiency         Patient Active Problem List   Diagnosis Code   . Calculi, ureter N20.1   . GERD (gastroesophageal reflux disease) K21.9   . Hypothyroid E03.9   . Bronchitis J40   . Pneumonia J18.9   . Anxiety F41.9   . Depression F32.A   . 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 Renal calculi N20.0   . SVT  (supraventricular tachycardia) I47.10   . Fibromyalgia M79.7   . Positive ANA (antinuclear antibody) R76.8   . Major depressive disorder, recurrent, moderate F33.1   . PTSD (post-traumatic stress disorder) F43.10   . Right upper quadrant abdominal pain R10.11            Past Surgical History:   Procedure Laterality Date   . APPENDECTOMY     . BUNIONECTOMY     . lithotripsy            Social History     Tobacco Use   . Smoking status: Every Day     Types: Vaping, Cigarettes     Last attempt to quit: 04/18/2021     Years since quitting: 1.9   . Smokeless tobacco: Never   Substance Use Topics   . Alcohol use: Not Currently   . Drug use: Not Currently     Types: Marijuana, Cocaine     Comment:  clean from crack cocaine 07/07/19             Review of Systems    Physical Exam  Triage Vitals  Triage Start: Start, (03/26/23 2345)  First Recorded BP: 149/89, Resp: 18, Temp: 36 C (96.8 F) Oxygen Therapy SpO2: 97 %, O2 Device: None (Room air), Heart Rate: (!) 113, (03/26/23 2345)  .      Physical Exam  Vitals and nursing note reviewed.   Constitutional:       General: She is not in acute distress.     Appearance: She is well-developed. She is obese. She is ill-appearing. She is not diaphoretic.   HENT:      Head: Normocephalic and atraumatic.   Eyes:      General: No scleral icterus.        Right eye: No discharge.         Left eye: No discharge.      Conjunctiva/sclera: Conjunctivae normal.   Neck:      Trachea: No tracheal deviation.   Cardiovascular:      Rate and Rhythm: Regular rhythm. Tachycardia present.      Pulses: Normal pulses.   Pulmonary:      Effort: Pulmonary effort is normal. No respiratory distress.      Breath sounds: No stridor. Wheezing (diffuse) present.   Abdominal:      General: Abdomen is flat. There is no distension.      Palpations: Abdomen is soft.      Tenderness: There is no abdominal tenderness.   Musculoskeletal:      Cervical back: Neck supple.   Skin:     General: Skin is warm and dry.       Capillary Refill: Capillary refill takes less than 2 seconds.      Coloration: Skin is not pale.      Findings: No rash.   Neurological:      General: No focal deficit present.      Mental Status: She is alert and oriented to person, place, and time.      Motor: No abnormal muscle tone.   Psychiatric:         Mood and Affect: Mood normal.         Behavior: Behavior normal.       Medical Decision Making   Patient seen by me on:  03/26/2023    Assessment:  32 y.o. F p/w 2 days worsening cough, SOB, chest pain, nausea, poor PO intake, and painful tussive urinary incontinence    Differential diagnosis:  COVID, PNA, Viral bronchitis, bronchospasm, asthma exacerbation, UTI, PE, less likely ACS or myocarditis    Plan:    1.  CBC, CMP, BNP, serum pregnancy, high-sensitivity troponin 0/1/3 hour, urinalysis, COVID/flu/RSV PCR  2.  EKG and telemetry, continuous pulse oximetry  3.  CT angiogram of the chest  4.  DuoNeb  5.  PO ibuprofen and Tylenol  6.  Zofran ODT  7.  1 L normal saline bolus    EKG Interpretation:  Normal sinus rhythm with a ventricular rate of 98, normal P waves, normal PRI, normal axis, normal QRS, poor R wave progression in the precordial leads, overall morphology is not significantly changed compared to prior dated 09/11/2021., tracing reviewed by myself, normal sinus rhythm, no ischemic changes    Independent interpretation of imaging: yes, see my impression below      ED Course as of 03/27/23 0643   Mon Mar 27, 2023   0019 CBC and differential(!)  WNL other than 0.1 IMM granulocytes   0027 Preg,Serum: NEG   0037 Chloride(!): 111   0038 UN(!): <4  0038 Creatinine(!): 0.47   0038 Glucose(!): 105   0038 Calcium(!): 6.8  IV and PO Calcium replacement ordered   0038 Total Protein(!): 5.9   0038 Albumin(!): 2.7   0038 Comprehensive metabolic panel(!)  WNL other than noted   0038 TROP T 0 HR High Sensitivity: <6   0038 NT-pro BNP: <50   0052 Potassium(!!): 2.8  PO KCl ordered   0134 COVID/Influenza A & B/RSV  NAAT (PCR)  neg   0134 TROP T 1 HR High Sensitivity: <6   0154 CT angio chest  Right sided atelectasis vs PNA. No PE. Formal read pending.   0251 Bing Matter(!): 1+   0251 Nitrite,UA: NEG   0251 Blood,UA(!): 1+   0251 CT angio chest  Impression    No pulmonary embolism.    Smooth thickening of the right minor fissure with prominent hilar lymph nodes, right greater than left, a nonspecific finding. Recommend short interval follow-up to ensure resolution.     0300 RBC,UA(!): 3-10   0300 WBC,UA: 6-10   0300 Bacteria,UA: 1+   0300 Squam Epithel,UA(!): 3+   0300 Trans Epithel,UA: 1+   0300 Mucus,UA: Present   0300 UA result unlikely infectious.   0422 Procalcitonin: 0.06   0428 On reconsideration, particularly after seeing normal procalcitonin level, this points further to pt's symptoms being much more likely to be viral in nature.  Ceftriaxone and doxycycline discontinued. Still giving solumedrol and additional nebs.   1610 CT angio chest  Impression    No pulmonary embolism.    Smooth thickening of the right minor fissure with prominent hilar lymph nodes, right greater than left, a nonspecific finding. Recommend short interval follow-up to ensure resolution.     0600 Glucose(!): 145   0600 Calcium(!): 8.1   0600 Basic metabolic panel(!)  WNL other than noted   0632 Pt has tolerated PO intake, ambulated w/o hypoxia, and electrolyte abnormalities have normalized with IVF and electrolyte replacement.  Discussed w/ pt plan for d/c home w/ steroid burst (next dose due PO tomorrow morning), BID Symbicort, and albuterol MDI q4h PRN.  PCP follow up, return if symptoms continue to worsen.       Clarene Critchley, MD          Author:  Clarene Critchley, MD       Tawni Levy, MD  03/27/23 [id number]

## 2023-03-28 LAB — EKG 12-LEAD
P: 41 deg
PR: 143 ms
QRS: 87 deg
QRSD: 93 ms
QT: 354 ms
QTc: 453 ms
Rate: 98 {beats}/min
T: 106 deg

## 2023-05-22 ENCOUNTER — Telehealth: Payer: Self-pay | Admitting: Family Medicine

## 2023-05-22 NOTE — Telephone Encounter (Signed)
FYI - former [facility] patient coming in for issues with IUD that was placed by [facility] over 2 years ago. Patient states the last week or so she has had pelvic pain and believes the IUD is not in the right place anymore.    Patient is scheduled with MPT on 05/26/2023 - patient has to get medical transportation.

## 2023-05-26 ENCOUNTER — Ambulatory Visit: Payer: Medicaid Other | Admitting: Family Medicine

## 2023-06-01 ENCOUNTER — Ambulatory Visit: Payer: Medicaid Other | Admitting: Family Medicine

## 2023-06-03 ENCOUNTER — Emergency Department
Admission: EM | Admit: 2023-06-03 | Discharge: 2023-06-03 | Discharge status: Against Medical Advice | Payer: Medicaid Other | Attending: Emergency Medicine | Admitting: Emergency Medicine

## 2023-06-03 NOTE — ED Triage Notes (Signed)
Pt BIBA from home due to pt stating that she had a seizure at 8pm, did cocaine at 0300am, tried to get a ride  to [place], pt wanted a ride to [place] from the police and they said no, so pt told them she felt like she was going to have a seizure and they called EMS to bring her in      Blood Glucose Meter (mg/dl): 88  Prehospital medications given: No

## 2023-07-09 LAB — UNMAPPED LAB RESULTS
Basophil # (HT): 0.1 10 3/uL (ref 0.0–0.1)
Basophil % (HT): 0 % (ref 0–3)
Eosinophil # (HT): 0.1 10 3/uL (ref 0.0–0.8)
Eosinophil % (HT): 1 % (ref 0–5)
Hematocrit (HT): 44 % (ref 38–48)
Hemoglobin (HGB) (HT): 14.9 g/dL (ref 11.9–15.9)
Lymphocyte # (HT): 2.7 10 3/uL (ref 0.6–3.3)
Lymphocyte % (HT): 18 % (ref 15–45)
MCHC (HT): 34 g/dL (ref 31.1–34.5)
MCV (HT): 90 fL (ref 80–97)
Mean Corpuscular Hemoglobin (MCH) (HT): 30.6 pg (ref 26.6–30.6)
Mean Platelet Volume (HT): 9.3 fL (ref 9.0–12.2)
Monocyte # (HT): 0.6 10 3/uL (ref 0.1–1.1)
Monocyte % (HT): 4 % (ref 0–15)
Neutrophil # (HT): 12 10 3/uL — ABNORMAL HIGH (ref 2.0–7.1)
Platelets (HT): 336 10 3/uL (ref 150–450)
RBC (HT): 4.87 10 6/uL (ref 4.04–5.48)
RDW (HT): 13 % (ref 12.9–16.3)
Seg Neut % (HT): 77 % — ABNORMAL HIGH (ref 45–75)
WBC (HT): 15.6 10 3/uL — ABNORMAL HIGH (ref 4.8–10.4)

## 2023-07-09 NOTE — BH Treatment Plan (Signed)
 Patient psychiatrically cleared for discharge. Safety plan and discharge instructions provided to patient.     Discharge arrangements:  Patient discharged to: Other Friend  Discharge Living arrangements: Home  Discharge Safety Planning Completed & Confirmed with: Patient     Reason why not completed: N/A      Confirmed transportation with: Self   If applicable:   Friend/Family and Contact Number: Gregary Lean - Friend is coming to get her.   Medical Cab Confirmation/Invoice Number:   [employer] & Phone number:     Patient Satisfaction Survey collected from patient: No               Reason: declined    Social Work Discharge Planning:  Legal Status on arrival: 9.41  Aftercare plan upon discharge:   Patient to be discharged home.  Patient to attend psychiatrist appointment on January 13th at [facility].  Patient asked to be referred to Partial Program.  Patient to take medications as prescribed.  Utilize safety plan as needed. If lethality concerns arise, to call [facility] [phone], Suicide Hotline 988, Lifeline 211 or contact 911 and/or present to the nearest emergency department.          Social Work Additional Information: Referral to Partial.

## 2023-07-24 ENCOUNTER — Other Ambulatory Visit: Payer: Self-pay | Admitting: Gastroenterology

## 2023-08-30 ENCOUNTER — Ambulatory Visit: Payer: Medicaid Other | Admitting: Internal Medicine

## 2023-08-30 NOTE — Progress Notes (Deleted)
Outpatient Progress Note       HPI    Casey Hughes is a 33 y.o. female who presents for the evaluation of  ***      REVIEW OF SYSTEMS    {Ros master: [phone]    Patient Active Problem List   Diagnosis Code    Calculi, ureter N20.1    GERD (gastroesophageal reflux disease) K21.9    Hypothyroid E03.9    Bronchitis J40    Pneumonia J18.9    Anxiety F41.9    Depression F32.A    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    Renal calculi N20.0    SVT (supraventricular tachycardia) I47.10    Fibromyalgia M79.7    Positive ANA (antinuclear antibody) R76.8    Major depressive disorder, recurrent, moderate F33.1    PTSD (post-traumatic stress disorder) F43.10    Right upper quadrant abdominal pain R10.11       Current Outpatient Medications   Medication    AeroChamber Z-Stat Plus/Small spacer device    OLANZapine-samidorphan (LYBALVI) 20-10 MG tablet    traZODone (DESYREL) 50 mg tablet    cetirizine (ZYRTEC) 10 mg tablet    omeprazole (PRILOSEC) 20 mg capsule    amphetamine-dextroamphetamine (ADDERALL XR) 30 mg 24 hr capsule    prazosin (MINIPRESS) 5 mg capsule    fluticasone (FLONASE) 50 MCG/ACT nasal spray    albuterol HFA (PROVENTIL, VENTOLIN, PROAIR HFA) 108 (90 Base) MCG/ACT inhaler    DULoxetine (CYMBALTA) 60 MG capsule    cyclobenzaprine (FLEXERIL) 10 mg tablet     No current facility-administered medications for this visit.           PHYSICAL EXAM    There were no vitals taken for this visit.    GENERAL: Appears the stated age, healthy, well developed and appropriately groomed. No signs of apparent distress present. Ability to communicate is appropriate for age. Alert and oriented. Patient is a good historian.  NECK: Normal to inspection. Palpation reveals no lymphadenopathy. No masses appreciated. Thyroid is normal to palpation. No JVD.  No carotid bruits.  CARDIOVASCULAR: Regular rate and rhythm. No heart murmur appreciated. Radial and pedal pulses are 2+ and equal bilaterally. No lower extremity edema.   PULM: Clear to auscultation  bilaterally.  Good air movement throughout.  No wheezes, rales, or rhonchi.  No increased work of breathing.  ABDOMEN: Abdomen is soft, nontender, and nondistended without guarding, rigidity or rebound tenderness.   SKIN: Skin is warm and dry.    LABS        Lab results: 03/27/23  0534 03/27/23  0008   Sodium 140 142   Potassium 3.5 2.8*   Chloride 107 111*   CO2 25 22   UN 5* <4*   Creatinine 0.54 0.47*   Glucose 145* 105*   Calcium 8.1* 6.8*   Total Protein  --  5.9*   Albumin  --  2.7*   ALT  --  17   AST  --  25   Alk Phos  --  86   Bilirubin,Total  --  <0.2           Lab results: 03/27/23  0008   WBC 8.7   Hemoglobin 13.3   Hematocrit 41   RBC 4.6   Platelets 252       No components found for: "AMYLASE"  Lab Results   Component Value Date    LIP 21 08/22/2022          Lab results: 03/27/23  0008 01/18/23  1236   Alk Phos 86 94   AST 25 14   ALT 17 10       ASSESSMENT AND PLAN    There are no diagnoses linked to this encounter.      No follow-ups on file.     Shann Medal, NP   [facility]  [address]  [facility]  [place] [address]  Dept: [phone]  Dept Fax: [phone]

## 2023-09-21 ENCOUNTER — Encounter: Payer: Self-pay | Admitting: Gastroenterology

## 2023-09-21 ENCOUNTER — Telehealth: Payer: Self-pay

## 2023-09-21 NOTE — Telephone Encounter (Signed)
 New patient referral received from [facility].  Forwarded to Rheum referral e-folder.

## 2023-09-26 ENCOUNTER — Ambulatory Visit (INDEPENDENT_AMBULATORY_CARE_PROVIDER_SITE_OTHER): Admitting: Physician Assistant

## 2023-09-26 ENCOUNTER — Telehealth (INDEPENDENT_AMBULATORY_CARE_PROVIDER_SITE_OTHER): Payer: Self-pay | Admitting: Physician Assistant

## 2023-09-26 NOTE — Telephone Encounter (Signed)
 Spoke to patient, was looking for more information about the need for the appointment. Patient stated she "recently relapsed and while on drugs had unprotected intercourse. Then I was told by someone that the person I had slept with 2 months ago had slept with someone else who had syphilis." Patient stated she went to her PCP last week for testing and they told her " if you get no phone call by Monday or Tuesday of next week, that is a good sign." Patient stated she did not hear anything but got a notification from the pharmacy stating there was some kind of medication ready for her. Patient stated "something is weird down there and now I am just concerned."  Patient stated she has also been having some "cramping and spotting." Patient stated she has an IUD [employer]) that was "placed at [facility] about 2 or 3 years ago." Patient stated she "relapsed in November until about 5 weeks ago. I am now trying to take care of myself." Patient stated she would also like to "check up in my endometriosis." Patient stated she told whoever when she scheduled the appointment this afternoon that she "needed at least 2-3 days and to have help arranging medical transportation for the appointment. Patient never got help setting up the transportation. Told patient I would transfer up to reception to reach out to schedule appointment as well as help set up medical transportation. Patient was understanding and agreeable.

## 2023-10-02 ENCOUNTER — Encounter: Payer: Self-pay | Admitting: Pediatric Surgery

## 2023-10-02 ENCOUNTER — Ambulatory Visit: Admitting: Pediatric Surgery

## 2023-10-06 ENCOUNTER — Other Ambulatory Visit: Payer: Self-pay | Admitting: Urology

## 2023-10-06 DIAGNOSIS — R3989 Other symptoms and signs involving the genitourinary system: Secondary | ICD-10-CM

## 2023-10-09 ENCOUNTER — Ambulatory Visit: Payer: Self-pay | Admitting: Urology

## 2023-10-10 ENCOUNTER — Ambulatory Visit: Admitting: Pediatric Surgery

## 2023-10-10 ENCOUNTER — Encounter: Payer: Self-pay | Admitting: Pediatric Surgery

## 2023-10-16 ENCOUNTER — Encounter: Payer: Self-pay | Admitting: Obstetrics and Gynecology

## 2023-10-20 ENCOUNTER — Emergency Department
Admission: EM | Admit: 2023-10-20 | Discharge: 2023-10-20 | Disposition: A | Discharge status: Home / Self Care | Payer: PRIVATE HEALTH INSURANCE | Source: Ambulatory Visit | Attending: Physician Assistant | Admitting: Physician Assistant

## 2023-10-20 ENCOUNTER — Other Ambulatory Visit: Payer: Self-pay

## 2023-10-20 DIAGNOSIS — I517 Cardiomegaly: Secondary | ICD-10-CM

## 2023-10-20 DIAGNOSIS — E039 Hypothyroidism, unspecified: Secondary | ICD-10-CM

## 2023-10-20 DIAGNOSIS — F1290 Cannabis use, unspecified, uncomplicated: Secondary | ICD-10-CM | POA: Insufficient documentation

## 2023-10-20 DIAGNOSIS — Y998 Other external cause status: Secondary | ICD-10-CM | POA: Insufficient documentation

## 2023-10-20 DIAGNOSIS — R55 Syncope and collapse: Secondary | ICD-10-CM | POA: Insufficient documentation

## 2023-10-20 DIAGNOSIS — R000 Tachycardia, unspecified: Secondary | ICD-10-CM | POA: Insufficient documentation

## 2023-10-20 DIAGNOSIS — F1490 Cocaine use, unspecified, uncomplicated: Secondary | ICD-10-CM | POA: Insufficient documentation

## 2023-10-20 DIAGNOSIS — X58XXXA Exposure to other specified factors, initial encounter: Secondary | ICD-10-CM | POA: Insufficient documentation

## 2023-10-20 DIAGNOSIS — N189 Chronic kidney disease, unspecified: Secondary | ICD-10-CM

## 2023-10-20 DIAGNOSIS — Y92009 Unspecified place in unspecified non-institutional (private) residence as the place of occurrence of the external cause: Secondary | ICD-10-CM | POA: Insufficient documentation

## 2023-10-20 DIAGNOSIS — Y9389 Activity, other specified: Secondary | ICD-10-CM | POA: Insufficient documentation

## 2023-10-20 DIAGNOSIS — R9431 Abnormal electrocardiogram [ECG] [EKG]: Secondary | ICD-10-CM

## 2023-10-20 DIAGNOSIS — T40411A Poisoning by fentanyl or fentanyl analogs, accidental (unintentional), initial encounter: Secondary | ICD-10-CM | POA: Insufficient documentation

## 2023-10-20 LAB — REGIONAL CHEMICAL DEPEN SCRN, UR
Amphetamine,UR: NEGATIVE
Barbiturate,UR: NEGATIVE
Benzodiazepinen,UR: POSITIVE — AB
Buprenorphine,Ur: NEGATIVE
Cocaine/Metab,UR: POSITIVE — AB
Methadone Metab,UR: NEGATIVE
Methamphetamine,Ur: NEGATIVE
Opiates,UR: NEGATIVE
Oxycodone,UR: NEGATIVE
PCP,UR: NEGATIVE
THC Metabolite,UR: POSITIVE — AB
Tricyclics,UR: NEGATIVE

## 2023-10-20 NOTE — ED Notes (Signed)
 Pt's mother arrived to the ED front desk, states that pt texted her last night, stating that she intended to end her life.  Mother is very concerned, as this has happened in the past, she requests that pt be evaluated for SI.  Advised primary RN, Consulting civil engineer and EM PA caring for pt.

## 2023-10-20 NOTE — Bed Hold Note (Signed)
Bed: TED-06  Expected date:   Expected time:   Means of arrival:   Comments:  amb

## 2023-10-20 NOTE — ED Triage Notes (Signed)
 Pt comes to ED via ambulance after drug overdose. Pt admits to snorting fentanyl. Pt admits to using it to get high. I dose of narcan given by first responders pt woke up.    Prehospital medications given: Yes  Reversal Medications: Narcan (1 dose)

## 2023-10-20 NOTE — ED Provider Notes (Addendum)
 History     Chief Complaint   Patient presents with    Drug Overdose     33yo female with PMH of borderline, fibromyalgia, substance abuse, and depression presents to the ED for evaluation of fentanyl overdose today while at home. Friend administered narcan, causing patient to wake up. Reports emesis. Denies intent to harm. Reports trouble with relationship and wanted to feel "numb". Does admit to marijuana use and cocaine use as well.             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    Cocaine abuse     positive test in 2020    COVID-19     03/2021    Depression     Endometriosis 2011    Fibromyalgia     GERD (gastroesophageal reflux disease) 02/16/2010    Horseshoe kidney 02/16/2010    Hypothyroid     12/14 denies     Lower GI bleed     colonoscopy revealed internal hemorrhoids as the source. 03/2017.    Major depressive disorder, recurrent, moderate 02/09/2018    Marijuana use     numerous positive tests from 2019-2023    Migraines     Positive ANA (antinuclear antibody) 06/13/2017    PTSD (post-traumatic stress disorder) 05/14/2018    Seizures     last 03/2017 in context abrupt withdrawl from medication    SVT (supraventricular tachycardia) 2013    Vaginitis     multiple positive tests for gardeneralla    Vitamin D deficiency         Patient Active Problem List   Diagnosis Code    Calculi, ureter N20.1    GERD (gastroesophageal reflux disease) K21.9    Hypothyroid E03.9    Bronchitis J40    Pneumonia J18.9    Anxiety F41.9    Depression F32.A    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   All other systems reviewed and are negative.      Physical Exam     Triage Vitals     First Recorded BP: 133/59, Resp: 20, Temp: 36 C (96.8 F) Oxygen Therapy SpO2: 96 %, Oximetry Source: Lt Hand, O2 Device: None (Room air), Heart Rate: 110, (10/20/23 1832) Heart Rate (via Pulse Ox): 110, (10/20/23 1832).      Physical Exam  Vitals and nursing note reviewed.   Constitutional:       Appearance: Normal appearance.   Cardiovascular:      Rate and Rhythm: Regular rhythm. Tachycardia present.      Pulses: Normal pulses.      Heart sounds:  Normal heart sounds.   Pulmonary:      Effort: Pulmonary effort is normal.      Breath sounds: Normal breath sounds.   Abdominal:      General: Bowel sounds are normal.      Palpations: Abdomen is soft.      Tenderness: There is no abdominal tenderness.   Neurological:      Mental Status: She is alert.   Psychiatric:         Attention and Perception: Attention normal.         Behavior: Behavior normal. Behavior is cooperative.         Thought Content: Thought content does not include homicidal or suicidal ideation. Thought content does not include homicidal or suicidal plan.         Medical Decision Making   Patient seen by me on:  10/20/2023    Assessment:  33yo female presents to ED for evaluation of fentanyl overdose. Awoke with narcan. Brought in by EMS and police. Denies any intent to harm.     Differential diagnosis:  Overdose, depression,     Plan:  Orders Placed This Encounter      Regional chemical depen scrn, ur      Benzodiazepine, confirmation, urine      Cocaine, urine, confirmation      Confirm THC Metabolite, urine      Continuous telemetry, non-protocol      EKG 12 lead (initial)        EKG Interpretation:  Sinus tachycardia    ED Course and Disposition:  Pt resting  comfortably. Does drop her o2 while sleeping and notes that she is supposed to have sleep study but has never followed up.   Pt has rehab plan in place. Does not want to use anymore. Hates being an "addict" and wants to stop.  Mother has concerned about messages sent yesterday afternoon however this was clarified at bedside. No intent to harm herself. Notes that if she wanted to harm herself, she would have taken more drugs but that was never her intention. Friend with her tonight agree to watch her and pt has safe ride home.     Pt is alert and oriented, can ambulate with steady gait.   Mother aware of discharge plan.       ED Course as of 10/20/23 2221   Fri Oct 20, 2023   2206 Pt is alert and oriented.   Tolerating PO.   Had lengthy discussion with both patient and her mother. Mother agreeable and understanding that her intention was to never harm herself. Pt has rehab plan in place, has to get housing situated. Pt plans to go to [facility] treatment center in [place].     Not interested in suboxone.     Pt noted that the text messages to her mother were in regards to being out of her mothers life permanently as she was upset. She was saying she would not longer talk to her, not that she was going to end her life.        Ewell Benassi Vilinda Boehringer, [place]          Author:  Paulita Fujita, PA         Pilar Jarvis [place], [place]  10/20/23 2215       [1]   Social History  Tobacco Use    Smoking status: Every Day     Types: Vaping, Cigarettes     Last attempt to quit:  04/18/2021     Years since quitting: 2.5    Smokeless tobacco: Never   Substance Use Topics    Alcohol use: Not Currently    Drug use: Yes     Types: Marijuana, Cocaine     Comment: fentanyl use        Paulita Fujita, [place]  10/20/23 2221

## 2023-10-20 NOTE — Discharge Instructions (Signed)
 You took too much fentanyl today causing you to become unconscious.   Narcan was given and you were brought in by EMS.   You denies any intent to harm yourself and you have a plan for rehab.   We discussed plan with your mother and she is agreeable.   Please return with any concerns or thoughts of harm.       Reach out [facility] if you want further assistance.   Address: [address] [place], [place] [address]  Phone: [phone]

## 2023-10-22 LAB — EKG 12-LEAD
P: 55 deg
PR: 156 ms
QRS: 98 deg
QRSD: 98 ms
QT: 355 ms
QTc: 483 ms
Rate: 111 {beats}/min
T: 112 deg

## 2023-10-23 LAB — CONFIRM THC METABOLITE, URINE: Confirm THC Metab: POSITIVE

## 2023-10-23 LAB — COCAINE, URINE, CONFIRMATION: Confirm COC/METAB: POSITIVE

## 2023-10-23 LAB — BENZODIAZEPINE, CONFIRMATION, URINE: Confirm BZD: POSITIVE

## 2023-11-03 ENCOUNTER — Telehealth: Payer: Self-pay | Admitting: Urology

## 2023-11-03 NOTE — Telephone Encounter (Signed)
 Copied from CRM [id number]. Topic: Appointments - Schedule Appointment  >> Nov 03, 2023 12:52 PM Elodie Haines wrote:  Patient is scheduled for NPV on 5/20 at 10:45 with Dr Chester Costa at the [facility] location for horseshoe kidney (diagnosis).

## 2023-11-20 ENCOUNTER — Ambulatory Visit: Payer: Self-pay | Admitting: Rheumatology

## 2023-11-20 ENCOUNTER — Encounter: Payer: Self-pay | Admitting: Rheumatology

## 2023-12-03 ENCOUNTER — Other Ambulatory Visit: Payer: Self-pay

## 2023-12-03 ENCOUNTER — Emergency Department
Admission: EM | Admit: 2023-12-03 | Discharge: 2023-12-03 | Disposition: A | Discharge status: Home / Self Care | Payer: PRIVATE HEALTH INSURANCE | Source: Ambulatory Visit | Attending: Emergency Medicine | Admitting: Emergency Medicine

## 2023-12-03 DIAGNOSIS — F17210 Nicotine dependence, cigarettes, uncomplicated: Secondary | ICD-10-CM

## 2023-12-03 DIAGNOSIS — Z789 Other specified health status: Secondary | ICD-10-CM

## 2023-12-03 DIAGNOSIS — X58XXXA Exposure to other specified factors, initial encounter: Secondary | ICD-10-CM

## 2023-12-03 DIAGNOSIS — T50905A Adverse effect of unspecified drugs, medicaments and biological substances, initial encounter: Secondary | ICD-10-CM

## 2023-12-03 DIAGNOSIS — N189 Chronic kidney disease, unspecified: Secondary | ICD-10-CM

## 2023-12-03 DIAGNOSIS — Y998 Other external cause status: Secondary | ICD-10-CM | POA: Insufficient documentation

## 2023-12-03 DIAGNOSIS — R9431 Abnormal electrocardiogram [ECG] [EKG]: Secondary | ICD-10-CM

## 2023-12-03 DIAGNOSIS — E039 Hypothyroidism, unspecified: Secondary | ICD-10-CM

## 2023-12-03 DIAGNOSIS — F1290 Cannabis use, unspecified, uncomplicated: Secondary | ICD-10-CM | POA: Insufficient documentation

## 2023-12-03 DIAGNOSIS — T7840XA Allergy, unspecified, initial encounter: Secondary | ICD-10-CM

## 2023-12-03 DIAGNOSIS — T43595A Adverse effect of other antipsychotics and neuroleptics, initial encounter: Secondary | ICD-10-CM | POA: Insufficient documentation

## 2023-12-03 DIAGNOSIS — Y9389 Activity, other specified: Secondary | ICD-10-CM | POA: Insufficient documentation

## 2023-12-03 DIAGNOSIS — L988 Other specified disorders of the skin and subcutaneous tissue: Secondary | ICD-10-CM | POA: Insufficient documentation

## 2023-12-03 DIAGNOSIS — Y9289 Other specified places as the place of occurrence of the external cause: Secondary | ICD-10-CM | POA: Insufficient documentation

## 2023-12-03 LAB — CBC AND DIFFERENTIAL
Baso # K/uL: 0.1 10*3/uL (ref 0.0–0.2)
Eos # K/uL: 0.4 10*3/uL (ref 0.0–0.5)
Hematocrit: 43 % (ref 34–49)
Hemoglobin: 14.5 g/dL (ref 11.2–16.0)
IMM Granulocytes #: 0.1 10*3/uL — ABNORMAL HIGH (ref 0.0–0.0)
IMM Granulocytes: 0.3 %
Lymph # K/uL: 2.4 10*3/uL (ref 1.0–5.0)
MCV: 91 fL (ref 75–100)
Mono # K/uL: 0.8 10*3/uL (ref 0.1–1.0)
Neut # K/uL: 11.2 10*3/uL — ABNORMAL HIGH (ref 1.5–6.5)
Nucl RBC # K/uL: 0 10*3/uL (ref 0.0–0.0)
Nucl RBC %: 0 /100{WBCs} (ref 0.0–0.2)
Platelets: 333 10*3/uL (ref 150–450)
RBC: 4.8 MIL/uL (ref 4.0–5.5)
RDW: 13.3 % (ref 0.0–15.0)
Seg Neut %: 75.1 %
WBC: 14.8 10*3/uL — ABNORMAL HIGH (ref 3.5–11.0)

## 2023-12-03 LAB — COMPREHENSIVE METABOLIC PANEL
ALT: 11 U/L (ref 0–35)
Albumin: 3.6 g/dL (ref 3.5–5.2)
Alk Phos: 84 U/L (ref 35–105)
Anion Gap: 13 (ref 7–16)
Bilirubin,Total: 0.5 mg/dL (ref 0.0–1.2)
CO2: 21 mmol/L (ref 20–28)
Calcium: 9.2 mg/dL (ref 8.8–10.2)
Chloride: 99 mmol/L (ref 96–108)
Creatinine: 0.41 mg/dL — ABNORMAL LOW (ref 0.51–0.95)
Glucose: 112 mg/dL — ABNORMAL HIGH (ref 60–99)
Lab: 5 mg/dL — ABNORMAL LOW (ref 6–20)
Sodium: 133 mmol/L (ref 133–145)
Total Protein: 8.1 g/dL — ABNORMAL HIGH (ref 6.3–7.7)
eGFR BY CREAT: 134 *

## 2023-12-03 LAB — TROPONIN T 0 HR HIGH SENSITIVITY (IP/ED ONLY): TROP T 0 HR High Sensitivity: <6 ng/L (ref 0–11)

## 2023-12-03 LAB — POTASSIUM: Potassium: 3.6 mmol/L (ref 3.3–4.6)

## 2023-12-03 LAB — PREGNANCY TEST, SERUM: Preg,Serum: NEGATIVE

## 2023-12-03 LAB — TROPONIN T 1 HR W/ DELTA HIGH SENSITIVITY: TROP T 1 HR High Sensitivity: <6 ng/L (ref 0–11)

## 2023-12-03 LAB — AST: AST: 20 U/L (ref 0–35)

## 2023-12-03 MED ORDER — DIPHENHYDRAMINE HCL 50 MG/ML IJ SOLN *I*
50.0000 mg | Freq: Once | INTRAMUSCULAR | Status: AC
Start: 2023-12-03 — End: 2023-12-03
  Administered 2023-12-03: 50 mg via INTRAVENOUS
  Filled 2023-12-03: qty 1

## 2023-12-03 MED ORDER — DIPHENHYDRAMINE HCL 50 MG/ML IJ SOLN *I*
INTRAMUSCULAR | Status: AC
Start: 2023-12-03 — End: 2023-12-03
  Administered 2023-12-03: 50 mg via INTRAVENOUS
  Filled 2023-12-03: qty 1

## 2023-12-03 NOTE — ED Provider Notes (Signed)
 History   No chief complaint on file.    33 year old female patient with a past medical history of anxiety, asthma, borderline personality disorder, chronic kidney disease, cocaine  abuse, depression, endometriosis, fibromyalgia, GERD, hypothyroidism, lower GI bleed, migraines, PTSD, GERD, SVT, presents with chief complaint of stating that she has had an allergic reaction.  She states she took her first dose of lybalvi and smoked three bowls of marijuana and then did not feel well.  She felt like her skin was on fire.  She had said she had abdominal pain to EMS but currently has no abdominal pain.  No vomiting or diarrhea.  No chest pain.       History provided by:  Patient  Language interpreter used: No          Medical/Surgical/Family History     Past Medical History:   Diagnosis Date   . Anxiety    . Asthma    . Borderline personality disorder    . 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   . Cocaine  abuse     positive test in 2020   . COVID-19     03/2021   . Depression    . Endometriosis 2011   . Fibromyalgia    . GERD (gastroesophageal reflux disease) 02/16/2010   . Horseshoe kidney 02/16/2010   . Hypothyroid     12/14 denies    . Lower GI bleed     colonoscopy revealed internal hemorrhoids as the source. 03/2017.   . Major depressive disorder, recurrent, moderate 02/09/2018   . Marijuana use     numerous positive tests from 2019-2023   . Migraines    . Positive ANA (antinuclear antibody) 06/13/2017   . PTSD (post-traumatic stress disorder) 05/14/2018   . Seizures     last 03/2017 in context abrupt withdrawl from medication   . SVT (supraventricular tachycardia) 2013   . Vaginitis     multiple positive tests for gardeneralla   . Vitamin D  deficiency         Patient Active Problem List   Diagnosis Code   . Calculi, ureter N20.1   . GERD (gastroesophageal reflux disease) K21.9   . Hypothyroid E03.9   . Bronchitis J40   . Pneumonia J18.9   . Anxiety F41.9   . Depression F32.A   . Horseshoe kidney  Q63.1   . Renal calculi N20.0   . SVT (supraventricular tachycardia) I47.10   . Fibromyalgia M79.7   . Positive ANA (antinuclear antibody) R76.8   . Major depressive disorder, recurrent, moderate F33.1   . PTSD (post-traumatic stress disorder) F43.10   . Right upper quadrant abdominal pain R10.11            Past Surgical History:   Procedure Laterality Date   . APPENDECTOMY     . BUNIONECTOMY     . lithotripsy            Social History[1]          Review of Systems   Constitutional:  Negative for chills and fever.   HENT:  Negative for congestion, rhinorrhea and sore throat.    Eyes:  Negative for visual disturbance.   Respiratory:  Negative for shortness of breath.    Cardiovascular:  Negative for chest pain.   Gastrointestinal:  Negative for abdominal pain.   Skin:  Negative for rash.   Psychiatric/Behavioral:  Negative for confusion.        Physical Exam  Triage Vitals     First Recorded BP: 131/75, Resp: 18, Temp: 35.1 C (95.2 F), Temp src: TEMPORAL Oxygen Therapy SpO2: 96 %, Oximetry Source: Lt Hand, O2 Device: None (Room air), Heart Rate: 72, (12/03/23 0133) Heart Rate (via Pulse Ox): 64, (12/03/23 0200).      Physical Exam  Vitals and nursing note reviewed.   Constitutional:       Appearance: Normal appearance.   HENT:      Head: Normocephalic and atraumatic.      Nose: Nose normal. No congestion or rhinorrhea.      Mouth/Throat:      Mouth: Mucous membranes are moist.      Pharynx: No oropharyngeal exudate or posterior oropharyngeal erythema.   Eyes:      Extraocular Movements: Extraocular movements intact.      Pupils: Pupils are equal, round, and reactive to light.   Cardiovascular:      Rate and Rhythm: Normal rate and regular rhythm.      Pulses: Normal pulses.      Heart sounds: Normal heart sounds.   Pulmonary:      Effort: Pulmonary effort is normal.      Breath sounds: Normal breath sounds. No wheezing, rhonchi or rales.   Abdominal:      General: Abdomen is flat. Bowel sounds are normal. There  is no distension.      Palpations: Abdomen is soft. There is no mass.      Tenderness: There is no abdominal tenderness. There is no guarding or rebound.   Musculoskeletal:         General: Normal range of motion.      Cervical back: Normal range of motion and neck supple.      Right lower leg: No edema.      Left lower leg: No edema.   Lymphadenopathy:      Cervical: No cervical adenopathy.   Skin:     General: Skin is warm.      Capillary Refill: Capillary refill takes less than 2 seconds.      Findings: No rash.   Neurological:      General: No focal deficit present.      Mental Status: She is alert and oriented to person, place, and time.      Sensory: No sensory deficit.      Motor: No weakness.   Psychiatric:         Mood and Affect: Mood normal.         Medical Decision Making   Patient seen by me on:  12/03/2023    Assessment:  33 yo female patient who presents with cc of allergic reaction.     Differential diagnosis:  Medication side effect, marijuana abuse    Plan:  Orders Placed This Encounter      CBC and differential      Comprehensive metabolic panel      Troponin T 0 HR High Sensitivity      Troponin T 1 HR W/ Delta High Sensitivity      Troponin T 3 HR W/ Delta High Sensitivity      Pregnancy Test, Serum      Hold blue      Comprehensive metabolic panel      Troponin T 0 HR High Sensitivity      EKG: follow up      EKG: initial      Insert peripheral IV        EKG Interpretation:  Sinus rhythm, rate of 68, t wave flattening avl, t wave inversion V2, similar to prior, tracing reviewed by myself    ED Course and Disposition:  The patient was made aware of the elevated wbc count and the need to have it rechecked.  Suspect the new medication in combination with the 3 bowls of marijuana caused the patient's symptoms.  Troponins were unremarkable.  The patient was given benadryl  and has been sleeping intermittently since she arrived.      Diagnosis- medication reaction       ED Course as of 12/03/23 0631    Sun Dec 03, 2023   0304 CBC and differential(!)   0304 Pregnancy Test, Serum   0436 Troponin T 0 HR High Sensitivity   0437 Comprehensive metabolic panel(!)   0631 Troponin T 1 HR W/ Delta High Sensitivity       Jeffrie Stander Gavin Kast, MD                [1]  Social History  Tobacco Use   . Smoking status: Every Day     Types: Vaping, Cigarettes     Last attempt to quit: 04/18/2021     Years since quitting: 2.6   . Smokeless tobacco: Never   Substance Use Topics   . Alcohol  use: Not Currently   . Drug use: Yes     Types: Marijuana, Cocaine      Comment: fentanyl  use      Consetta Cosner Catherine, MD  12/03/23 [phone]

## 2023-12-03 NOTE — Discharge Instructions (Addendum)
 Stop the lybalvi and talk to your doctor about what happened. Have your doctor recheck your wbc count since it is elevated.

## 2023-12-03 NOTE — ED Notes (Addendum)
 EKG done and given to Dr. Benjiman Core

## 2023-12-03 NOTE — Bed Hold Note (Signed)
 Bed: TED-04  Expected date:   Expected time:   Means of arrival:   Comments:  AMB: FTT

## 2023-12-03 NOTE — ED Triage Notes (Signed)
 Per EMS pt comes from home for original complaint of allergic reaction, pt just started lybalvi and smoked 3 bowls of marijuana @ the same time. Pt is now complaining of abd pain, pt refused EMS to assess her     Prehospital medications given: No

## 2023-12-04 LAB — EKG 12-LEAD
P: 31 deg
PR: 167 ms
QRS: 84 deg
QRSD: 104 ms
QT: 414 ms
QTc: 441 ms
Rate: 68 {beats}/min
T: 96 deg

## 2023-12-04 NOTE — Telephone Encounter (Unsigned)
 Copied from CRM [id number]. Topic: Appointments - Reschedule Appointment  >> Dec 04, 2023  9:01 AM Jerry Morgans wrote:  Casey Hughes, patient, is reschedule missed NPV  appointment.     Patient has been rescheduled for 04/29/2024 at 11:00 AM with Dr. Alphonzo Jenkins.     [name] can be reached at [phone] if necessary.

## 2023-12-11 ENCOUNTER — Other Ambulatory Visit: Payer: Self-pay | Admitting: Urology

## 2023-12-11 DIAGNOSIS — R3989 Other symptoms and signs involving the genitourinary system: Secondary | ICD-10-CM

## 2023-12-12 ENCOUNTER — Ambulatory Visit: Payer: Self-pay | Admitting: Urology

## 2023-12-14 ENCOUNTER — Encounter: Payer: Self-pay | Admitting: Urology

## 2023-12-25 ENCOUNTER — Ambulatory Visit: Payer: PRIVATE HEALTH INSURANCE | Attending: Family Medicine | Admitting: Family Medicine

## 2023-12-25 ENCOUNTER — Other Ambulatory Visit: Payer: Self-pay

## 2023-12-25 ENCOUNTER — Encounter: Payer: Self-pay | Admitting: Family Medicine

## 2023-12-25 VITALS — BP 129/58 | HR 92 | Temp 97.0°F | Resp 18

## 2023-12-25 DIAGNOSIS — R6884 Jaw pain: Secondary | ICD-10-CM | POA: Insufficient documentation

## 2023-12-25 DIAGNOSIS — K047 Periapical abscess without sinus: Secondary | ICD-10-CM | POA: Insufficient documentation

## 2023-12-25 DIAGNOSIS — R21 Rash and other nonspecific skin eruption: Secondary | ICD-10-CM | POA: Insufficient documentation

## 2023-12-25 DIAGNOSIS — K0381 Cracked tooth: Secondary | ICD-10-CM | POA: Insufficient documentation

## 2023-12-25 DIAGNOSIS — F17290 Nicotine dependence, other tobacco product, uncomplicated: Secondary | ICD-10-CM | POA: Insufficient documentation

## 2023-12-25 DIAGNOSIS — F17210 Nicotine dependence, cigarettes, uncomplicated: Secondary | ICD-10-CM | POA: Insufficient documentation

## 2023-12-25 MED ORDER — AMOXICILLIN-POT CLAVULANATE 875-125 MG PO TABS *I*
1.0000 | ORAL_TABLET | Freq: Two times a day (BID) | ORAL | 0 refills | Status: AC
Start: 2023-12-25 — End: 2024-01-01

## 2023-12-25 NOTE — UC Provider Note (Signed)
 History     Chief Complaint   Patient presents with    Dental Problem     Right upper dental pain and swelling, started last night. Has used ibuprofen  with effect  She woke up this am red rashy area on her left cheek, has used a topical lotion with burned.     33 year old female patient with a past medical history of anxiety, asthma, borderline personality disorder, chronic kidney disease, cocaine  abuse, depression, endometriosis, fibromyalgia, GERD, hypothyroidism, lower GI bleed, migraines, PTSD, GERD, SVT who comes today with upper right tooth pain and states her teeth are rotted and does not like to see a dentist; no fever. Feels pain in the tooth area with slight swelling. Has a non-itchy rash on the left side of face as well.      History provided by:  Patient      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    Cocaine  abuse     positive test in 2020    COVID-19     03/2021    Depression     Endometriosis 2011    Fibromyalgia     GERD (gastroesophageal reflux disease) 02/16/2010    Horseshoe kidney 02/16/2010    Hypothyroid     12/14 denies     Lower GI bleed     colonoscopy revealed internal hemorrhoids as the source. 03/2017.    Major depressive disorder, recurrent, moderate 02/09/2018    Marijuana use     numerous positive tests from 2019-2023    Migraines     Positive ANA (antinuclear antibody) 06/13/2017    PTSD (post-traumatic stress disorder) 05/14/2018    Seizures     last 03/2017 in context abrupt withdrawl from medication    SVT (supraventricular tachycardia) 2013    Vaginitis     multiple positive tests for gardeneralla    Vitamin D  deficiency         Patient Active Problem List   Diagnosis Code    Calculi, ureter N20.1    GERD (gastroesophageal reflux disease) K21.9    Hypothyroid E03.9    Bronchitis J40    Pneumonia J18.9    Anxiety F41.9    Depression  F32.A    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    Renal calculi N20.0    SVT (supraventricular tachycardia) I47.10    Fibromyalgia M79.7    Positive ANA (antinuclear antibody) R76.8    Major depressive disorder, recurrent, moderate F33.1    PTSD (post-traumatic stress disorder) F43.10    Right upper quadrant abdominal pain R10.11            Past Surgical History:   Procedure Laterality Date    APPENDECTOMY      BUNIONECTOMY      lithotripsy       Family History   Problem Relation Age of Onset    Anxiety disorder Mother     Arthritis Mother     Cancer Mother     Depression Mother     Emphysema Mother     Arthritis Father           Social History[1]  Living Situation       Questions Responses    Patient lives with Other(comment)    Comment: Reports being homeless - stays at  [facility]     Homeless Yes    Caregiver for other family member No    External Services Mental Health Services    Comment: [employer]     Employment Unemployed    Comment: [employer]      Domestic Violence Risk No                  Review of Systems   Review of Systems   Constitutional:  Negative for fever.   HENT:  Positive for dental problem.    Skin:  Positive for rash.   All other systems reviewed and are negative.      Physical Exam   Vitals     First Recorded BP: 129/58, Resp: 18, Temp: 36.1 C (97 F), Temp src: TEMPORAL Oxygen Therapy SpO2: 99 %, Heart Rate: 92, (12/25/23 1629)  .      Physical Exam  Vitals and nursing note reviewed.   Constitutional:       General: She is not in acute distress.     Appearance: Normal appearance. She is well-developed. She is not ill-appearing.   HENT:      Mouth/Throat:      Mouth: Mucous membranes are moist.     Pulmonary:      Effort: Pulmonary effort is normal. No respiratory distress.   Musculoskeletal:      Cervical back: Normal range of motion and neck supple.   Lymphadenopathy:      Cervical: No cervical adenopathy.   Skin:     General: Skin is warm and dry.      Comments: Faint rash- blanchable on the left side of face but  also a spot on bilateral cheeks and right lateral side of eyebrow. Appears more of a contact dermatitis.    Neurological:      Mental Status: She is alert and oriented to person, place, and time.   Psychiatric:         Mood and Affect: Mood normal.         Behavior: Behavior normal.         Thought Content: Thought content normal.         Judgment: Judgment normal.          Medical Decision Making   Medical Decision Making    Patient seen by me: 12/25/2023     Assessment:  33 y.o., female comes to the Urgent Care for evaluation of upper right tooth pain and states her teeth are rotted and does not like to see a dentist; no fever. Feels pain in the tooth area with slight swelling. Has a non-itchy rash on the left side of face as well.    Differential diagnosis:   Jaw pain  Dental pain  Dental infection  Contact dermatitis    Independent Review of: Chart / prior reports     Plan:   Orders Placed This Encounter    amoxicillin -clavulanate (AUGMENTIN ) 875-125 mg tablet        Pt instructions:  Follow up with pcp    Take benadryl  if needed for rash    No smoking as the bacteria goes into the open wound of tooth and makes infection, pain and swelling worse.     Dental  Ibuprofen  600mg  every 8 hours with food.  Tylenol  1000mg  every 8hrs for pain.      [facility] - They see patients with emergencies on a first come, first served basis, Mon - Fri, 8:30 - 5 p.m. at [address] To save time, you can call [phone] to pre-register.    Urgent Dental care, call 24 hrs [phone]; evening hours until 10pm at [address] [place].    GET PROMPT MEDICAL ATTENTION in emergency department:   if any of the following occur:   -- Your face becomes more swollen or red   -- Your eyelids become swollen   -- Pain worsens or spreads to the neck   -- Fever over 100.4 F (38.0 C)   -- Unusual drowsiness; headache or stiff neck; weakness or fainting   -- Pus drains from the tooth   -- Difficulty swallowing or  breathing    New Prescriptions    AMOXICILLIN -CLAVULANATE (AUGMENTIN ) 875-125 MG TABLET    Take 1 tablet by mouth every 12 hours for 7 days for dental.      Course & disposition: discharge to home      Final Diagnosis    ICD-10-CM ICD-9-CM   1. Jaw pain  R68.84 784.92   2. Broken or cracked tooth, nontraumatic  K03.81 521.81   3. Dental infection  K04.7 522.4   4. Rash  R21 782.1         Rayhan Groleau Belenda Bowie, NP          Author:  Joelle Musca, NP               [1]   Social History  Tobacco Use    Smoking status: Every Day     Types: Vaping, Cigarettes     Last attempt to quit: 04/18/2021     Years since quitting: 2.6    Smokeless tobacco: Never   Substance Use Topics    Alcohol  use: Not Currently    Drug use: Yes     Types: Marijuana, Cocaine      Comment: fentanyl  use

## 2023-12-25 NOTE — Patient Instructions (Signed)
 Follow up with pcp    Take benadryl  if needed for rash      No smoking as the bacteria goes into the open wound of tooth and makes infection, pain and swelling worse.     Dental  Ibuprofen  600mg  every 8 hours with food.  Tylenol  1000mg  every 8hrs for pain.      [facility] - They see patients with emergencies on a first come, first served basis, Mon - Fri, 8:30 - 5 p.m. at [employer]. To save time, you can call [phone] to pre-register.    Urgent Dental care, call 24 hrs [phone]; evening hours until 10pm at [address] [place].    GET PROMPT MEDICAL ATTENTION in emergency department:   if any of the following occur:   -- Your face becomes more swollen or red   -- Your eyelids become swollen   -- Pain worsens or spreads to the neck   -- Fever over 100.4 F (38.0 C)   -- Unusual drowsiness; headache or stiff neck; weakness or fainting   -- Pus drains from the tooth   -- Difficulty swallowing or breathing

## 2024-01-15 ENCOUNTER — Ambulatory Visit: Admitting: Internal Medicine

## 2024-01-16 ENCOUNTER — Ambulatory Visit: Payer: PRIVATE HEALTH INSURANCE | Admitting: Internal Medicine

## 2024-01-17 ENCOUNTER — Encounter: Payer: Self-pay | Admitting: Internal Medicine

## 2024-01-29 ENCOUNTER — Ambulatory Visit: Payer: PRIVATE HEALTH INSURANCE | Admitting: Rheumatology

## 2024-01-30 ENCOUNTER — Telehealth: Payer: Self-pay | Admitting: Rheumatology

## 2024-01-30 NOTE — Telephone Encounter (Unsigned)
 Copied from CRM #[id number]. Topic: Appointments - Schedule Appointment  >> Jan 30, 2024  3:05 PM Kathern NOVAK wrote:  Casey Hughes, patient, is calling to reschedule missed appointment.     Patient rescheduled to 07/29/2024 at 11:00 AM with Dr. Abagail Isaiah I Soulliere can be reached at [phone] if necessary

## 2024-02-06 ENCOUNTER — Encounter: Payer: Self-pay | Admitting: Rheumatology

## 2024-02-22 ENCOUNTER — Ambulatory Visit: Payer: PRIVATE HEALTH INSURANCE | Admitting: Internal Medicine

## 2024-03-04 ENCOUNTER — Telehealth: Payer: Self-pay | Admitting: Internal Medicine

## 2024-03-04 NOTE — Telephone Encounter (Signed)
 Patient called and left a voicemail stating she apologizes for missing her new patient appointment on 02/22/24 she had to move and was stressed out and didn't call to cancel but would like to reschedule. Called patient back, and rescheduled appointment.

## 2024-03-06 ENCOUNTER — Encounter: Payer: Self-pay | Admitting: Internal Medicine

## 2024-04-23 ENCOUNTER — Ambulatory Visit: Payer: PRIVATE HEALTH INSURANCE | Admitting: Internal Medicine

## 2024-04-23 NOTE — Progress Notes (Unsigned)
 [facility] Internal Medicine Casey Casey Breed, MD   {E&M Documentation Guidelines :28549}{Chief Complaint :28549}No chief complaint on file.   Casey Hughes is a 33 y.o. female HPI History of Present IllnessHistory No chief complaint on file.Medical/Surgical/Family History Past Medical History[1] Patient Active Problem List Diagnosis Code  Calculi, ureter N20.1  GERD (gastroesophageal reflux disease) K21.9  Hypothyroid E03.9  Bronchitis J40  Pneumonia J18.9  Anxiety F41.9  Depression F32.A  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  Renal calculi N20.0  SVT (supraventricular tachycardia) I47.10  Fibromyalgia M79.7  Positive ANA (antinuclear antibody) R76.8  Major depressive disorder, recurrent, moderate F33.1  PTSD (post-traumatic stress disorder) F43.10  Right upper quadrant abdominal pain R10.11  Past Surgical History[2]Family History[3] Social History[4]Living Situation   Questions Responses  Patient lives with Other(comment)  Comment: Reports being homeless - stays at [employer] Yes  Caregiver for other family member No  External Services Mental Health Services  Comment: [employer]   Employment Unemployed  Comment: [employer]    Domestic Violence Risk No    Review of Systems ROS Physical Exam There were no vitals filed for this visit. Physical ExamClinical Assessment Assessment & PlanPlan No problem-specific Assessment & Plan notes found for this encounter. Follow Up{Diagnosis, Billing, Follow-up :28549} No follow-ups on file.    (MyChart Activation Status- Activated [1]){Link to [facility] MyChart Administration :28549} Casey Casey Breed, MD [facility] INTERNAL [id number] [facility] [place] [id number]: [phone] Fax: [phone]  This note was done with the assistance of [employer] AI recognition system.  A reasonable attempt to proofread for any mistakes was done. If any are noted, they in no way reflect the quality of the care rendered.  [1] Past Medical History:Diagnosis Date  Anxiety   Asthma   Borderline personality disorder   Chronic kidney disease   stone and horseshoe kidney  Cocaine  abuse   positive test in 2020  COVID-19   03/2021  Depression   Endometriosis 2011  Fibromyalgia   GERD (gastroesophageal reflux disease) 02/16/2010  Horseshoe kidney 02/16/2010  Hypothyroid   12/14 denies   Lower GI bleed   colonoscopy revealed internal hemorrhoids as the source. 03/2017.  Major depressive disorder, recurrent, moderate 02/09/2018  Marijuana use   numerous positive tests from 2019-2023  Migraines   Positive ANA (antinuclear antibody) 06/13/2017  PTSD (post-traumatic stress disorder) 05/14/2018  Seizures   last 03/2017 in context abrupt withdrawl from medication  SVT (supraventricular tachycardia) 2013  Vaginitis   multiple positive tests for gardeneralla  Vitamin D  deficiency  [2] Past Surgical History:Procedure Laterality Date  APPENDECTOMY    BUNIONECTOMY    lithotripsy   [3] Family HistoryProblem Relation Name Age of Onset  Anxiety disorder Mother    Arthritis Mother    Cancer Mother    Depression Mother    Emphysema Mother    Arthritis Father   [4] Social HistoryTobacco Use  Smoking status: Every Day   Types: Vaping, Cigarettes   Last attempt to quit: 04/18/2021   Years since quitting: 3.0  Smokeless tobacco: Never Substance Use Topics  Alcohol  use: Not Currently  Drug use: Yes   Types: Marijuana, Cocaine    Comment: fentanyl  use

## 2024-04-26 ENCOUNTER — Emergency Department
Admission: EM | Admit: 2024-04-26 | Discharge: 2024-04-26 | Disposition: A | Discharge status: Home / Self Care | Payer: PRIVATE HEALTH INSURANCE | Source: Ambulatory Visit | Attending: Emergency Medicine | Admitting: Emergency Medicine

## 2024-04-26 ENCOUNTER — Emergency Department: Payer: PRIVATE HEALTH INSURANCE

## 2024-04-26 DIAGNOSIS — Y998 Other external cause status: Secondary | ICD-10-CM | POA: Insufficient documentation

## 2024-04-26 DIAGNOSIS — X501XXA Overexertion from prolonged static or awkward postures, initial encounter: Secondary | ICD-10-CM | POA: Insufficient documentation

## 2024-04-26 DIAGNOSIS — E039 Hypothyroidism, unspecified: Secondary | ICD-10-CM

## 2024-04-26 DIAGNOSIS — X500XXA Overexertion from strenuous movement or load, initial encounter: Secondary | ICD-10-CM

## 2024-04-26 DIAGNOSIS — Y9389 Activity, other specified: Secondary | ICD-10-CM | POA: Insufficient documentation

## 2024-04-26 DIAGNOSIS — M7732 Calcaneal spur, left foot: Secondary | ICD-10-CM

## 2024-04-26 DIAGNOSIS — R600 Localized edema: Secondary | ICD-10-CM

## 2024-04-26 DIAGNOSIS — Y9289 Other specified places as the place of occurrence of the external cause: Secondary | ICD-10-CM | POA: Insufficient documentation

## 2024-04-26 DIAGNOSIS — S82425A Nondisplaced transverse fracture of shaft of left fibula, initial encounter for closed fracture: Secondary | ICD-10-CM

## 2024-04-26 DIAGNOSIS — N189 Chronic kidney disease, unspecified: Secondary | ICD-10-CM

## 2024-04-26 DIAGNOSIS — S82832A Other fracture of upper and lower end of left fibula, initial encounter for closed fracture: Secondary | ICD-10-CM | POA: Insufficient documentation

## 2024-04-26 MED ORDER — ACETAMINOPHEN 325 MG PO TABS *I*
975.0000 mg | ORAL_TABLET | Freq: Once | ORAL | Status: AC
Start: 2024-04-26 — End: 2024-04-26
  Administered 2024-04-26: 975 mg via ORAL
  Filled 2024-04-26: qty 3

## 2024-04-26 MED ORDER — IBUPROFEN 600 MG PO TABS *I*
600.0000 mg | ORAL_TABLET | Freq: Once | ORAL | Status: AC
Start: 2024-04-26 — End: 2024-04-26
  Administered 2024-04-26: 600 mg via ORAL
  Filled 2024-04-26: qty 1

## 2024-04-26 NOTE — ED Provider Notes (Signed)
 History Chief Complaint Patient presents with  Ankle Injury 52F with PMH of GERD, PNA, anxiety, depression, SVT, PTSD, and other hx as noted below arrives to ED with left ankle injury. Patient states she was walking out of her hotel today when she missed a step and twisted her left ankle. She reports pain to the lateral aspect of her left ankle with some discomfort extending into her lower leg as well. No numbness or tingling. She is able to preform ROM with some pain. No other injuries or concerns. History provided by:  PatientLanguage interpreter used: No  Medical/Surgical/Family History Past Medical History[1] Patient Active Problem List Diagnosis Code  Calculi, ureter N20.1  GERD (gastroesophageal reflux disease) K21.9  Hypothyroid E03.9  Bronchitis J40  Pneumonia J18.9  Anxiety F41.9  Depression F32.A  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  Renal calculi N20.0  SVT (supraventricular tachycardia) I47.10  Fibromyalgia M79.7  Positive ANA (antinuclear antibody) R76.89  Major depressive disorder, recurrent, moderate F33.1  PTSD (post-traumatic stress disorder) F43.10  Right upper quadrant abdominal pain R10.11  Past Surgical History[2] Social History[3]  Review of Systems Musculoskeletal:       Left ankle injury All other systems reviewed and are negative.Physical Exam Triage Vitals   First Recorded BP: 145/90, Resp: 16, Temp: 36.9 C (98.4 F) Oxygen Therapy SpO2: 97 %, Heart Rate: 95, (04/26/24 1716)  .Physical ExamVitals and nursing note reviewed. Constitutional:     General: She is awake. She is not in acute distress.HENT:    Head: Normocephalic and atraumatic.    Right Ear: External ear normal.    Left Ear: External ear normal.    Nose: Nose normal.    Mouth/Throat:    Lips: Pink.    Mouth: Mucous membranes are moist. Cardiovascular:    Rate and Rhythm: Normal rate and regular  rhythm.    Pulses:         Dorsalis pedis pulses are 2+ on the left side.      Posterior tibial pulses are 2+ on the left side. Pulmonary:    Effort: Pulmonary effort is normal. No tachypnea or respiratory distress. Musculoskeletal:    Cervical back: Full passive range of motion without pain. No spinous process tenderness or muscular tenderness.    Left lower leg: Tenderness (distal aspect) present.    Left ankle: Swelling present. No deformity or ecchymosis. Tenderness present over the lateral malleolus. Normal range of motion.    Left foot: Normal. Normal capillary refill. No swelling, tenderness or bony tenderness. Normal pulse.      Legs:   Comments: Patient with tenderness to the lateral aspect of the left ankle- extending slightly into the lower leg- swelling to the ankle- normal ROM with some discomfort- normal sensation- +2 pulses  Skin:   General: Skin is warm.    Capillary Refill: Capillary refill takes less than 2 seconds.    Coloration: Skin is not jaundiced or pale. Neurological:    General: No focal deficit present.    Mental Status: She is alert, oriented to person, place, and time and easily aroused.    GCS: GCS eye subscore is 4. GCS verbal subscore is 5. GCS motor subscore is 6.    Sensory: Sensation is intact.    Motor: Motor function is intact. Psychiatric:       Behavior: Behavior is cooperative. Medical Decision Making Assessment:  52F arrives to ED with left ankle injury. Patient states she was walking out of her hotel today when she missed a step and twisted her  left ankle. She reports pain to the lateral aspect of her left ankle with some discomfort extending into her lower leg as well. No numbness or tingling. She is able to preform ROM with some pain. No other injuries or concerns. Exam: Patient with tenderness to the lateral aspect of the left ankle- extending slightly into the lower leg- swelling to the ankle- normal ROM  with some discomfort- normal sensation- +2 pulses VS: stable for ptDifferential diagnosis:  Strain, sprain, fx Plan:  Orders Placed This Encounter    * Ankle LEFT standard AP, lateral, mortise views    * Tibia fibula LEFT standard AP and Lateral views    Apply ice to affected area ED Course and Disposition:  Xrays showing Nondisplaced transverse Weber type A fracture of the distal fibula with lateral ankle soft tissue edema.Reviewed with ortho on call- walking boot, crutches, WBAT, outpatient ortho follow upReviewed findings with patient- verbalized understanding of results and follow up care. She was given a walking boot and crutches in the ED. Tylenol  and motrin  as needed for pain- RICE also reviewed with pt. Ortho follow up outpatient. Return precautions given. Pt stable at time of discharge. ED Course as of 04/26/24 2257 Fri Apr 26, 2024 8166 * Ankle LEFT standard AP, lateral, mortise viewsNondisplaced transverse Weber type A fracture of the distal fibula with lateral ankle soft tissue edema. Ankle mortise is maintained. Dorsal and plantar calcaneal enthesophytes.  1833 * Tibia fibula LEFT standard AP and Lateral viewsNondisplaced transverse Weber type A fracture of the distal fibula with lateral ankle soft tissue edema. 1840 Reviewed pt with ortho PA Karleen will plan for Walking boot, crutches, weight bearing as tolerating, outpatient ortho follow up next week.  Laymon Bitter, NP  [1] Past Medical History:Diagnosis Date  Anxiety   Asthma   Borderline personality disorder   Chronic kidney disease   stone and horseshoe kidney  Cocaine  abuse   positive test in 2020  COVID-19   03/2021  Depression   Endometriosis 2011  Fibromyalgia   GERD (gastroesophageal reflux disease) 02/16/2010  Horseshoe kidney 02/16/2010  Hypothyroid   12/14 denies   Lower GI bleed   colonoscopy revealed internal hemorrhoids  as the source. 03/2017.  Major depressive disorder, recurrent, moderate 02/09/2018  Marijuana use   numerous positive tests from 2019-2023  Migraines   Positive ANA (antinuclear antibody) 06/13/2017  PTSD (post-traumatic stress disorder) 05/14/2018  Seizures   last 03/2017 in context abrupt withdrawl from medication  SVT (supraventricular tachycardia) 2013  Vaginitis   multiple positive tests for gardeneralla  Vitamin D  deficiency  [2] Past Surgical History:Procedure Laterality Date  APPENDECTOMY    BUNIONECTOMY    lithotripsy   [3] Social HistoryTobacco Use  Smoking status: Every Day   Types: Vaping, Cigarettes   Last attempt to quit: 04/18/2021   Years since quitting: 3.0  Smokeless tobacco: Never Substance Use Topics  Alcohol  use: Not Currently  Drug use: Yes   Types: Marijuana, Cocaine    Comment: fentanyl  use  Mayley Lish, [place], NP10/03/25 2259

## 2024-04-26 NOTE — Bed Hold Note (Signed)
 Bed: TEDFT-05Expected date: Expected time: Means of arrival: Comments:EMS - ankle injury

## 2024-04-26 NOTE — ED Triage Notes (Addendum)
 Patient to the ED via EMS for L ankle injury. Mechanical fall from standing. Patient able to ambulate on it with pain. EMS denies deformity. Patient is currently taking SUBUTEX

## 2024-04-26 NOTE — Discharge Instructions (Signed)
 Xray- Nondisplaced transverse Weber type A fracture of the distal fibula with lateral ankle soft tissue edema. Use the walking bootCrutches can also be usedWeight bearing as toleratedTylenol and motrin  for painRest, ice, and elevation.Please follow up with OrthopedicsReturn to the Emergency Department if you experience worsening pain, numbness, tingling, change of color in your toes, or any other concerning symptoms.

## 2024-04-29 ENCOUNTER — Ambulatory Visit: Payer: PRIVATE HEALTH INSURANCE | Admitting: Rheumatology

## 2024-04-29 ENCOUNTER — Encounter: Payer: Self-pay | Admitting: Internal Medicine

## 2024-05-03 ENCOUNTER — Ambulatory Visit: Payer: PRIVATE HEALTH INSURANCE

## 2024-05-03 ENCOUNTER — Encounter: Payer: Self-pay | Admitting: Internal Medicine

## 2024-05-06 ENCOUNTER — Ambulatory Visit
Admission: RE | Admit: 2024-05-06 | Discharge: 2024-05-06 | Disposition: A | Payer: PRIVATE HEALTH INSURANCE | Source: Ambulatory Visit

## 2024-05-06 ENCOUNTER — Ambulatory Visit: Payer: PRIVATE HEALTH INSURANCE | Attending: Orthopaedic Surgery

## 2024-05-06 VITALS — BP 114/69 | HR 88 | Ht 64.0 in | Wt 235.0 lb

## 2024-05-06 DIAGNOSIS — S82832A Other fracture of upper and lower end of left fibula, initial encounter for closed fracture: Secondary | ICD-10-CM

## 2024-05-06 DIAGNOSIS — S82832D Other fracture of upper and lower end of left fibula, subsequent encounter for closed fracture with routine healing: Secondary | ICD-10-CM

## 2024-05-06 DIAGNOSIS — M2142 Flat foot [pes planus] (acquired), left foot: Secondary | ICD-10-CM

## 2024-05-06 DIAGNOSIS — S8002XA Contusion of left knee, initial encounter: Secondary | ICD-10-CM

## 2024-05-06 DIAGNOSIS — R600 Localized edema: Secondary | ICD-10-CM

## 2024-05-06 NOTE — Progress Notes (Signed)
 Orthopaedics and Physical Performance Subjective Casey Hughes is a 33 y.o. female who presents for New Patient Visit of the Left Lower LegHistory of Present IllnessThe patient is a very pleasant 33 year old female who is here for a follow-up of a left ankle injury/fracture that occurred on 04/26/2024.She sustained the injury while walking outside her hotel, missing a step and twisting her ankle. She sought immediate medical attention at the emergency department where an x-ray revealed a minor fracture in the ankle. She was provided with a boot and advised to apply ice, elevate the foot, take over-the-counter pain relievers, and follow up with this office.It has been 10 days since the injury, and she continues to experience significant pain, which disrupts her sleep and often leads to oversleeping in the morning. She also reports numbness and tingling sensations extending from her leg to her toes, with occasional loss of sensation in her toes and the top of her foot. She has no history of back issues. She also mentions bruises and cuts from the fall around her knee. She is currently taking Subutex and prefers not to add any other medications. She is managing her pain with ibuprofen  and Tylenol , both taken at a dose of 1000 mg twice daily. She has been elevating her foot when indoors. She is homeless and is seeking a letter for the Department of Social Services as they relocate her every 4 weeks, and she is unable to move her belongings due to her condition. She notes that massaging her leg provides some relief. She recalls that meloxicam was prescribed following her bunion surgery, but it did not provide relief.Additionally, she reports pain in the front of her knee. She has a job interview today and is concerned about her ability to attend due to her injury.She has fibromyalgia, lupus, and migraines.PAST SURGICAL HISTORY:Bunion surgery Objective Blood pressure 114/69, pulse 88,  height 1.626 m (5' 4), weight 106.6 kg (235 lb).Physical ExamIntegumentary: Skin over the left ankle is clean, dry, and intact. No erythema or ecchymosis. Skin over the left knee is clean, dry, intact except for some superficial abrasions on the lateral aspect of the proximal tibia fibula region.Musculoskeletal:Left ankle: Mild-to-moderate swelling about the lateral aspect. Moderate tenderness to palpation along the ATFL ligament along the distal fibula. Mild tenderness to palpation along the CFL ligament and the PTFL ligament. Mild-to-moderate tenderness to palpation along the insertion of the Achilles. No significant tenderness to palpation along the medial malleolus or deltoid ligament complex. Limited active dorsiflexion and active plantar flexion secondary to pain and swelling.Left knee: Moderate tenderness to palpation along the anterior surface. Tenderness to palpation over the superficial abrasions on the lateral aspect of the proximal lateral aspect of the lower leg. Full knee extension. Knee flexion to approximately 120 degrees. Can perform active quad set. Stable to varus and valgus stress testing at 0 and 30 degrees. Negative anterior and posterior drawer testing. McMurray's meniscal testing deferred secondary to ankle injury and not having joint line tenderness.ResultsImaging - X-ray of left ankle: 04/26/2024, Nondisplaced Weber A distal fibula fracture with minimal displacement. No other fractures can be appreciated.- Repeat xrays of left ankle 05/06/24- Demonstrate a healing weber A distal fibula fracture with no significant displacement.   Assessment & Plan1. Weber A left ankle fracture/ moderate Left ankle sprain:Moderate left ankle sprain/distal fibula fracture occurred on 04/26/2024. Physical examination reveals mild-to-moderate swelling and tenderness around the lateral aspect of the ankle, with additional tenderness along the ATFL, CFL, and PTFL ligaments.  Continue wearing the boot for at  least another 2 weeks. An x-ray of the left ankle will be obtained today to ensure there is no displacement of the fracture due to increasing pain. If the x-ray shows no displacement, continue with the current treatment plan. Take 600-800 mg of ibuprofen  every 8 hours and up to 1000 mg of Tylenol  every 8 hours as needed, without exceeding 2400 mg of ibuprofen  and 3000 mg of Tylenol  per day. Continue icing and elevating the ankle and introduce heat intermittently for comfort. A letter will be provided for DSS stating that there is a left ankle injury and due to this has difficulty ambulating without assistive devices. Letter for DSS provided. If the condition improves, physical therapy will be considered in [redacted] weeks along with transition to an ASO ankle brace.2. Left knee contusion:Likely superficial bony contusion of the left knee, evidenced by moderate tenderness to palpation along the anterior surface of the knee and tenderness over superficial abrasions on the lateral aspect of the proximal lower leg. Full range of motion of the knee with no ligamentous laxity or significant swelling. Use ice or heat as needed for comfort and take over-the-counter medications for pain relief. If the knee becomes more painful or if symptoms persist, return for further evaluation.Follow-up: The patient will follow up in 2 week, no repeat xrays. Author: Haeley Fordham BUDYNAS, NP  Note signed: 05/06/2024

## 2024-05-20 ENCOUNTER — Ambulatory Visit: Payer: PRIVATE HEALTH INSURANCE | Attending: Orthopaedic Surgery

## 2024-05-20 VITALS — BP 116/72 | HR 68 | Ht 64.0 in | Wt 230.0 lb

## 2024-05-20 DIAGNOSIS — S82832A Other fracture of upper and lower end of left fibula, initial encounter for closed fracture: Secondary | ICD-10-CM

## 2024-05-20 DIAGNOSIS — S93402D Sprain of unspecified ligament of left ankle, subsequent encounter: Secondary | ICD-10-CM

## 2024-05-20 NOTE — Progress Notes (Signed)
 Orthopaedics and Physical Performance Subjective Casey Hughes is a 33 y.o. female who presents for Follow-up of the Left Lower LegHistory of Present IllnessThe patient is a very pleasant 33 year old female who is here for a 2-week follow-up from a left ankle Weber A fracture and moderate left ankle sprain. The injury occurred on 04/26/2024.She still reports persistent difficulty in mobility, accompanied by significant pain although less than a couple weeks ago. Bruising has shown some improvement, but she states that the swelling remains substantial. She experiences intermittent tingling and numbness in her toes and the soles of her feet, which become particularly painful at times but improves with movement. She has been managing her pain with concurrent doses of ibuprofen  and Tylenol , which she finds effective. She also mentions that she finds it challenging to navigate the bus system with her walker. Objective Blood pressure 116/72, pulse 68, height 1.626 m (5' 4), weight 104.3 kg (230 lb).Physical ExamIntegumentary: Skin on the left ankle is clean, dry, and intact.Musculoskeletal:Left ankle: Mild generalized swelling about the lateral aspect of the ankle. Moderate tenderness to palpation along the ATFL ligament, distal fibula, PTFL ligaments. No significant tenderness to palpation along the CFL ligament, medial malleolus or deltoid ligament complex. Limited active dorsiflexion, active plantar flexion with more pain with plantar flexion, referred to the lateral aspect of the ankle.Midfoot and forefoot: Mild diffuse tenderness.Others: Sensation intact to medial, lateral, dorsal and plantar aspects of the foot. Can move all toes freely. DP pulse 2+.ResultsNone  Assessment & Plan1. Left ankle Weber A fracture and moderate ankle sprain24 days out from a left ankle minor distal fibula fracture/ankle sprain. Reports significant pain, swelling, and bruising all of  which has improved since her last visit. Physical examination reveals mild generalized swelling about the lateral aspect of the ankle and moderate tenderness to palpation along the ATFL ligament distal fibula. Patient advised to continue wearing the boot for an additional week. An ankle brace will be fitted today. If minimal to no pain after a week, start transitioning into the ankle brace in 1 week from today. Physical therapy will be initiated within the next week to assist in this transition and ultimately to help patient transition back into normal shoe wear. If ankle is still painful in the boot in 1 week, she can continue in the boot for an additional week before transitioning to an ankle brace. A physical therapy referral will be provided today. Avoid running and jumping activities. If the condition worsens or deviates from the plan, contact for an earlier appointment.Follow-up: Follow up in 1 month. No xrays  Author: Shanteria Laye BUDYNAS, NP  Note signed: 05/20/2024

## 2024-05-29 ENCOUNTER — Ambulatory Visit: Payer: PRIVATE HEALTH INSURANCE | Admitting: Urgent Care

## 2024-05-29 ENCOUNTER — Encounter: Payer: Self-pay | Admitting: Urgent Care

## 2024-05-29 DIAGNOSIS — Z7689 Persons encountering health services in other specified circumstances: Secondary | ICD-10-CM | POA: Insufficient documentation
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
## 2024-05-29 NOTE — Assessment & Plan Note (Deleted)
 Establish careHealthy adultCare Gaps discussed and updatedLabs: OrderedCPE: dueImmunizations: Mammogram:  not dueDEXA: not dueColon Cancer Screen: not dueDentist:Eye: AUDIT-C and DAST were reviewed.  No evidence of alcohol  or substance misuse.CPE in 1 year

## 2024-05-31 NOTE — Progress Notes (Unsigned)
 Sent via: {Sent cpj:62871}Eybdprpjw attestation for Plan of Care: Physician: Hughes, Casey, NPPer signature, I have reviewed and agree with the documented plan of care.Signature: _______________________________________________________________________________________________________________Plan of Care The physician's co-signature on this note indicates that they have reviewed this evaluation and agree with the documented goals and plan of care.  [facility]Lower [facility] Initial EvaluationName: Casey FERNS HustedDOB: April 30, 1992Referring Physician: Budynas, Casey, NPToday's Date: 11/10/2025Visit #: Visit count could not be calculated. Make sure you are using a visit which is associated with an episode.Diagnosis:   ICD-10-CM ICD-9-CM 1. Closed fracture of left distal fibula  S82.832A 824.8 2. Moderate ankle sprain, left, subsequent encounter  S93.402D V58.89   845.00 SubjectiveAllison I Hughes is a 33 y.o. female who is present today for L ankle care following a Weber A fracture suffered on 04/26/24 after missing a step while leaving a hotel, resulting in a twisting injury to the L ankle. The patient is currently ambulating using a walking boot, Mechanism of injury/history of symptoms:  {PT Mechanism of injury:26756}Onset date:  {onset date:27077::***}Symptoms:  (frequency/location/type/ worsen/improve) *** Pain level (0 - 10 scale): Now {PAIN OZCZOD:77059} Best {PAIN LEVELS:22940} Worst {PAIN LEVELS:22940}  Acceptable {PAIN LEVELS:22940}Date of surgery: {date of surgery:27079::NA}HistoryPrior therapy: {NA/explanation:16022516}Previous providers seen: {OTS Providers:27652}Diagnostic tests: {Diagnositic tests:26760}Night Pain: {YES NO:22742}   Restful sleep:   {YES NO:22742}Morning Pain/Stiffness: {Increased/decreased/na:15831} Relevant  Comorbidities/Personal Factors: Past Medical History[1]. These are relevant factors as they: {Relevant factors:23549}, thus impacting treatment outcome and plan of care. Surgical history: Past Surgical History[2]Current Medications: Current medications reviewed with Patient by evaluating therapist and potential effects on patient participation/outcome for therapy.Occupation and ActivitiesWork status: {MISC; WORK DUJULD:73242}Gna title/type of work: {Type of [employer] Stresses/physical demands of job: {Physical demands o fJob:26759}Sports/Hobbies: ***Living/Home Environment: {FFT OTS LIVING/HOME ENVIRONMENT:21013726}Stresses/physical demands of home: { physical demands of home:[phone]}Prior level of function: {PT/OT Prior Level of Function:23906}Assistive device:  {Assistive Devices DME:24716}Pt stated goals: ***Outcomes: {Lower Extremity Outcomes:24847} ObjectiveBlood pressure: *** mmHgHeart Rate: *** bpmSpO2: ***%Observation: {Observation:23744}Posture: {Lower Extremity Posture:24300}Gait: {Gait:23745}Palpation:  {LE palpation:27714} @ {NA:28095}ROM / STRENGTHKNEE LEFT RIGHT ROM (deg) AROM PROM AROM PROM Flexion      Extension      Strength (MMT) Quadriceps    Hamstrings   *denotes pain  ANKLE LEFT RIGHT ROM (deg) AROM PROM AROM PROM Dorsiflexion Knee Ext      Dorsiflexion Knee Flex     Plantarflexion      Inversion     Eversion      Hallux Extension  - - - - Strength (MMT) Gastrocnemius (heel rises)   Peroneals - - Tibialis Posterior - - Tibialis Anterior   Girth (cm) Bi-Malleolar - - Figure 8 - - *denotes painLE Functional Screen: ***Sit to stand:Squat:Special Tests: Ankle {Ankle / foot:27661} Balance Assessment: SL balance:RLE:LLE:Assessment:Findings consistent with 33 y.o., {female/female:29594} with ***.A history with No personal factors and/or comorbidities that  impact the plan of care.An examination of body system(s) using standardized test and measures addressing 1-2 elements from any of the following: body structures and functions, activity limitations and/or participation restrictions.A clinical presentation with stable and/or uncomplicated characteristics. A Low Complexity evaluation was performed today. Prognosis:  Good Contraindications/Precautions/Limitation:  Per protocol and Per diagnosisGoal Length Status Patient will initiate HEP. {goal length:23396} New Patient will improve pain 2 points on the NPRS scale to indicate progression towards overall pain reduction. {goal length:23396} New Patient will improve {LE joint movements:54434} strength to *** or better throughout in order to improve ability to complete ADLs. {goal length:23396} New  Patient will improve {Right/left:16020} {LE joint movements:54434} ROM to *** or better to improve ability to move without limitation. {goal length:23396} New Patient will ambulate using the least restrictive device without significant deviation/compensation in order to increase community participation. {goal length:23396} New Patient will improve {LE outcome measures:54437} score to *** or better to demonstrate an improvement in overall function. {goal length:23396} New Patient will verbalize and demonstrate independence with a comprehensive HEP for continued independent progression towards any remaining physical therapy goals. {goal length:23396} New {FFT LE goals:51462} {goal length:23396} New Treatment Plan: Anticipated plan of care reviewed with patient and/or family:  {YES ON:22742}Freq {NUMBERS 0-7:20272} times per {Time; day/week/month:13537} for {NUMBERS 0-7:20272} {MONTHS/WEEKS/DAYS:23338}Treatment plan inclusive of:            Exercise: AROM, AAROM, PROM, Stretching, Strengthening, Progressive Resistive, Coordination, Aerobic exercise            Manual Techniques:   Myofascial Release, Joint mobilization, Soft tissue release/massage, Edema management            Modalities: Cold pack, Cryotherapy            Aquatic therapy: AROM, AAROM, PROM, Stretching, Strengthening, Progressive Resistive, Aerobic exercise            Neuro Re-ed: Balance, Coordination, Proprioception, and PNF            Functional: Proprioception/Dynamic stability, Functional rehab, Gait training, Balance             CPTs: Therapeutic Exercise (97110), Therapeutic Activity (97530), Neuromuscular Re-ed (02887), Manual Therapy (02859), Gait Training (02883), Massage (02875), and Aquatic Therapy (02886)          Treatment today consisted nq:Umzjufzwu: Details THERAPEUTIC EXERCISE Per referral on 05/20/24: Evaluate and Treat: 04/26/24 Left ankle Weber A fracture and moderate ankle sprain. Plan is to wean from walking boot over the next 1-2 weeks into an ASO lace up ankle brace. Please help patient wean from boot to brace and then back to normal shoe wear. Work on ROM and then slowly progress to strength and conditioning in about 2-3 weeks time HEP      MANUAL      NEUROMUSCULAR RE-ED      THERAPEUTIC ACTIVITY      GAIT    MODALITIES      OTHER    Thank you for referring this patient to [facility].HERLENE DAS, PT, DPT, OCS   Time Reporting Minutes Service-Based Procedures/ Modalities  Evaluation (high, moderate, low)  Re-evaluation  Traction, mechanical  Electric stimulation (unattended)  Total service-based billable procedures    Time-Based  Procedures / Modalities  Therapeutic ex  Neuromuscular Re-ed  Manual Therapy  Therapeutic Activities  Gait training, including stairs  Ultrasound  Electrical Stimulation  Iontophoresis  Physical Performance Test  Aquatic Therapy  Total Time-Based Billable Procedures    Total Skilled Treatment Time   [1] Past  Medical History:Diagnosis Date  Anxiety   Asthma   Borderline personality disorder   Calculi, ureter 02/16/2010  Chronic kidney disease   stone and horseshoe kidney  Cocaine  abuse   positive test in 2020  COVID-19   03/2021  Depression   Diarrhea 03/22/2017  Endometriosis 2011  Fibromyalgia   GERD (gastroesophageal reflux disease) 02/16/2010  Horseshoe kidney 02/16/2010  Hypothyroid   12/14 denies   Lower GI bleed   colonoscopy revealed internal hemorrhoids as the source. 03/2017.  Major depressive disorder, recurrent, moderate 02/09/2018  Marijuana use   numerous positive tests from 2019-2023  Migraines  Nausea 03/22/2017  Positive ANA (antinuclear antibody) 06/13/2017  PTSD (post-traumatic stress disorder) 05/14/2018  Renal calculi 02/16/2010  Seizures   last 03/2017 in context abrupt withdrawl from medication  SVT (supraventricular tachycardia) 2013  Vaginitis   multiple positive tests for gardeneralla  Vitamin D  deficiency  [2] Past Surgical History:Procedure Laterality Date  APPENDECTOMY    BUNIONECTOMY    lithotripsy

## 2024-06-03 ENCOUNTER — Ambulatory Visit: Payer: PRIVATE HEALTH INSURANCE

## 2024-06-03 DIAGNOSIS — S82832A Other fracture of upper and lower end of left fibula, initial encounter for closed fracture: Secondary | ICD-10-CM

## 2024-06-03 DIAGNOSIS — S93402D Sprain of unspecified ligament of left ankle, subsequent encounter: Secondary | ICD-10-CM

## 2024-06-17 ENCOUNTER — Ambulatory Visit: Payer: PRIVATE HEALTH INSURANCE

## 2024-06-21 ENCOUNTER — Ambulatory Visit: Payer: PRIVATE HEALTH INSURANCE

## 2024-06-21 NOTE — Progress Notes (Unsigned)
 Sent via: eRecord EMRPhysician attestation for Plan of Care: Physician: Budynas, Katlin, NPPer signature, I have reviewed and agree with the documented plan of care.Signature: _______________________________________________________________________________________________________________Plan of Care The physician's co-signature on this note indicates that they have reviewed this evaluation and agree with the documented goals and plan of care.  [facility] Initial EvaluationName: Casey FERNS HustedDOB: 24-May-1992Referring Physician: Budynas, Katlin, NPToday's Date: 11/28/2025Visit #: Visit count could not be calculated. Make sure you are using a visit which is associated with an episode.Diagnosis: No diagnosis found.SubjectiveAllison I Hughes is a 33 y.o. female who is present today for L ankle care following a Weber A fracture suffered on 04/26/24 after missing a step while leaving a hotel, resulting in a twisting injury to the L ankle. The patient is currently ambulating using a walking boot, She was referred to PT for assistance weaning from ankle boot, brace, and restoring ROM, strength and conditioning. Mechanism of injury/history of symptoms:  Trauma and ankle sprain with fractureOnset date:  04/26/24, AcuteSymptoms:  (frequency/location/type/ worsen/improve) *** Pain level (0 - 10 scale): Now {PAIN OZCZOD:77059} Best {PAIN LEVELS:22940} Worst {PAIN LEVELS:22940}  Acceptable {PAIN LEVELS:22940}Date of surgery: NAHistoryPrior therapy: {NA/explanation:16022516}Previous providers seen: orthopedistDiagnostic tests: Per report, reviewed, X-rayNight Pain: {YES NO:22742}   Restful sleep:   {YES NO:22742}Morning Pain/Stiffness: {Increased/decreased/na:15831} Relevant Comorbidities/Personal Factors: Past Medical History:Diagnosis Date   Anxiety   Asthma   Borderline personality disorder   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 These are relevant factors as they: {Relevant factors:23549}, thus impacting treatment outcome and plan of care. Surgical history: Past Surgical History:Procedure Laterality Date  BUNIONECTOMY   Current Medications: Current medications reviewed with Patient by evaluating therapist and potential effects on patient participation/outcome for therapy.Occupation and ActivitiesWork status: {MISC; WORK DUJULD:73242}Gna title/type of work: {Type of [employer] Stresses/physical demands of job: {Physical demands o fJob:26759}Sports/Hobbies: ***Living/Home Environment: {FFT OTS LIVING/HOME ENVIRONMENT:21013726}Stresses/physical demands of home: { physical demands of home:[phone]}Prior level of function: {PT/OT Prior Level of Function:23906}Assistive device:  {Assistive Devices DME:24716}Pt stated goals: ***Outcomes: FAAM: *** % perceived ability, *** points ObjectiveBlood pressure: *** mmHgHeart Rate: *** bpmSpO2: ***%Observation: {Observation:23744}Posture: {Lower Extremity Posture:24300}Gait: {Gait:23745}Palpation:  {LE palpation:27714} @ {NA:28095}ROM / STRENGTHKNEE LEFT RIGHT ROM (deg) AROM PROM AROM PROM Flexion      Extension      Strength (MMT) Quadriceps    Hamstrings   *denotes pain  ANKLE LEFT RIGHT ROM (deg) AROM PROM AROM PROM Dorsiflexion Knee Ext      Dorsiflexion Knee Flex     Plantarflexion      Inversion     Eversion      Hallux Extension  - - - -  Strength (MMT) Gastrocnemius (heel rises)   Peroneals - - Tibialis Posterior - - Tibialis Anterior   Girth (cm) Bi-Malleolar - - Figure 8 - - *denotes painLE Functional Screen: ***Sit to stand:Squat:Special Tests: Ankle {Ankle / foot:27661} Balance Assessment: SL balance:RLE:LLE:Assessment:Findings consistent with 33 y.o., female with ***.A history with No personal factors and/or comorbidities that impact the plan of care.An examination of body system(s) using standardized test and measures addressing 1-2 elements from any of the following: body structures and  functions, activity limitations and/or participation restrictions.A clinical presentation with stable and/or uncomplicated characteristics. A Low Complexity evaluation was performed today. Prognosis:  Good Contraindications/Precautions/Limitation:  Per protocol and Per diagnosisGoal Length Status Patient will initiate HEP. {goal length:23396} New Patient will improve pain 2 points on the NPRS scale to indicate progression towards overall pain reduction. {goal length:23396} New Patient will improve {LE joint movements:54434} strength to *** or better throughout in order to improve ability to complete ADLs. {goal length:23396} New Patient will improve {Right/left:16020} {LE joint movements:54434} ROM to *** or better to improve ability to move without limitation. {goal length:23396} New Patient will ambulate using the least restrictive device without significant deviation/compensation in order to increase community participation. {goal length:23396} New Patient will improve {LE outcome measures:54437} score to *** or better to demonstrate an improvement in overall function. {goal length:23396} New Patient will verbalize and demonstrate independence with a comprehensive HEP for continued independent progression towards any remaining physical therapy goals. {goal length:23396} New  {FFT LE goals:51462} {goal length:23396} New Treatment Plan: Anticipated plan of care reviewed with patient and/or family:  YesFreq 1-2 times per week for 12 visit(s)Treatment plan inclusive of:            Exercise: AROM, AAROM, PROM, Stretching, Strengthening, Progressive Resistive, Coordination, Aerobic exercise            Manual Techniques:  Myofascial Release, Joint mobilization, Soft tissue release/massage, Edema management            Modalities: Cold pack, Cryotherapy            Aquatic therapy: AROM, AAROM, PROM, Stretching, Strengthening, Progressive Resistive, Aerobic exercise            Neuro Re-ed: Balance, Coordination, Proprioception, and PNF            Functional: Proprioception/Dynamic stability, Functional rehab, Gait training, Balance             CPTs: Therapeutic Exercise (97110), Therapeutic Activity (97530), Neuromuscular Re-ed (02887), Manual Therapy (02859), Gait Training (02883), Massage (02875), and Aquatic Therapy (02886)          Treatment today consisted nq:Umzjufzwu: Details THERAPEUTIC EXERCISE Per referral on 05/20/24: Evaluate and Treat: 04/26/24 Left ankle Weber A fracture and moderate ankle sprain. Plan is to wean from walking boot over the next 1-2 weeks into an ASO lace up ankle brace. Please help patient wean from boot to brace and then back to normal shoe wear. Work on ROM and then slowly progress to strength and conditioning in about 2-3 weeks time HEP      MANUAL      NEUROMUSCULAR RE-ED      THERAPEUTIC ACTIVITY      GAIT    MODALITIES      OTHER    Thank you for referring this patient to [facility].Catarina Deitra Bare, PT,DPT   Time Reporting Minutes Service-Based Procedures/ Modalities  Evaluation (high, moderate, low)  Re-evaluation  Traction, mechanical  Electric stimulation (unattended)  Total service-based billable  procedures    Time-Based  Procedures / Modalities  Therapeutic ex  Neuromuscular Re-ed  Manual Therapy  Therapeutic Activities  Gait training, including stairs  Ultrasound  Electrical Stimulation  Iontophoresis  Physical Performance Test  Aquatic Therapy  Total Time-Based Billable Procedures    Total Skilled Treatment Time

## 2024-06-26 ENCOUNTER — Ambulatory Visit: Payer: MEDICAID | Attending: Orthopaedic Surgery

## 2024-06-26 ENCOUNTER — Ambulatory Visit
Admission: RE | Admit: 2024-06-26 | Discharge: 2024-06-26 | Disposition: A | Discharge status: Home / Self Care | Payer: MEDICAID | Source: Ambulatory Visit

## 2024-06-26 ENCOUNTER — Other Ambulatory Visit: Payer: Self-pay

## 2024-06-26 VITALS — BP 107/60 | HR 57 | Ht 64.0 in | Wt 230.0 lb

## 2024-06-26 DIAGNOSIS — M79672 Pain in left foot: Secondary | ICD-10-CM

## 2024-06-26 DIAGNOSIS — S82832A Other fracture of upper and lower end of left fibula, initial encounter for closed fracture: Secondary | ICD-10-CM

## 2024-06-26 DIAGNOSIS — Z043 Encounter for examination and observation following other accident: Secondary | ICD-10-CM

## 2024-06-26 DIAGNOSIS — S82832D Other fracture of upper and lower end of left fibula, subsequent encounter for closed fracture with routine healing: Secondary | ICD-10-CM

## 2024-06-27 NOTE — Progress Notes (Signed)
 Orthopaedics and Physical Performance Subjective Casey Hughes is a 33 y.o. female who presents for Follow-up of the Left Ankle (The patient reports a newly sustained ankle inversion injury on 06/25/2024 /)History of Present IllnessThe patient is a very pleasant 33 year old female who presents for follow-up of a left Weber A avulsion ankle fracture off the distal fibula, in addition to a moderate lateral ankle sprain.She was last seen at the end of 04/2024, transitioning from the boot into the ankle brace, and was going to start physical therapy. However, she has not yet started physical therapy. Unfortunately, she slipped on ice yesterday and had another inversion injury, twisting the ankle and reinjuring the outside of her ankle. She is also experiencing pain on the top of her foot near her big toe, but most of the pain is on the outside of the ankle. Since then, she has still been in normal shoes and has been able to walk on it. She rates the pain as an 8 out of 10. She reports experiencing shooting pain at night that radiates up to her knee. She has been managing the pain with ibuprofen  and Tylenol , which have provided some relief. Additionally, she has been applying ice and elevating the affected area to the best of her ability. Objective Blood pressure 107/60, pulse 57, height 1.626 m (5' 4), weight 104.3 kg (230 lb).Physical ExamIntegumentary: The skin of the left ankle and foot is clean, dry, and intact.Musculoskeletal:Lateral aspect of the left ankle: Mild generalized swelling, no significant ecchymosisDistal fibula: Moderate tenderness to palpationATFL ligament: Moderate tenderness to palpationCFL ligament: Mild to moderate tenderness to palpationPTFL ligament: Mild to moderate tenderness to palpationMedial malleolus: No tenderness to palpationDeltoid ligament complex: No tenderness to palpationCalf: Soft and nontender to palpationLeft ankle: Can perform gentle  active dorsiflexion and active plantar flexionBase of the 1st and 2nd metatarsal: Mild tenderness to palpationMTP joints of the great toe extending into the distal aspect of the 1st metatarsal and the middle proximal phalanx of the great toe: Mild tenderness to palpationOthers: Sensation intact on the medial, lateral, dorsal, and plantar aspect of the foot. No plantar ecchymosis. Radial DP pulse 2+.ResultsImaging - X-ray of the left ankle: 06/26/2024, Demonstrates a fracture from a previous fracture site off of the distal fibula of the left ankle. The fracture is minimally displaced but more displaced than it was previously. No other acute fractures or dislocations are observed. There is some soft tissue swelling on the lateral ankle.- X-rays of the left foot: 06/26/24- No acute fractures or dislocations. Evidence of surgical hardware from prior bunion   Assessment & Plan1. Left Weber A avulsion ankle fracture:Reinjury of the left ankle occurred after slipping on ice, causing a slight displacement of the previous Weber A ankle fracture. X-rays taken today show a minimally displaced fracture from a previous fracture site off the distal fibula of the left ankle, with no other acute fractures or dislocations. There is some soft tissue swelling on the lateral ankle. Advised to wear a boot for a month to rehabilitate the ankle and avoid further irritation. Weightbearing is allowed as tolerated, and gentle ankle range of motion exercises should be performed to prevent stiffness. A note will be provided to allow staying at the local hotel and avoid traveling via the [facility] system for DSS appointments due to the recent reinjury. This note will be effective from 06/26/2024 through 07/22/2024.2. Moderate lateral ankle sprain:Reaggravation of the moderate lateral ankle sprain has occurred, with less swelling than before. Pain is rated at 8/10 on the  outside of the ankle, with some pain on  the top of the foot near the big toe. Currently taking ibuprofen  and Tylenol  for pain management, which is reported as helpful. Icing and elevating the ankle to the best of ability. A weightbearing left foot x-ray will be ordered to ensure there are no additional injuries, which there are none. Foot xrays were benign.  Results communicated to patient via phone on 06/27/24.Follow-up: 07/22/2024. No repeat xrays Author: Denzil Bristol BUDYNAS, NP  Note signed: 06/27/2024

## 2024-07-02 ENCOUNTER — Telehealth: Payer: Self-pay

## 2024-07-02 NOTE — Telephone Encounter (Signed)
 Patient presented to the office with concerns of her boot falling apart. She states that the straps are ripping at the seams and the velcro is not sticking. Ok per KB to schedule nurse visit for new high tide walking boot. Ok per RW to schedule. Patient scheduled for Friday due to transportation.

## 2024-07-04 NOTE — Progress Notes (Unsigned)
 Patient arriving to office for a brace issue - product exchange. Patient to come in on December 12,2025 at [place]. OK per Katlin Budynas,NP. Follow up as scheduled. Vessie Molly, RN

## 2024-07-05 ENCOUNTER — Ambulatory Visit: Payer: Self-pay

## 2024-07-22 ENCOUNTER — Ambulatory Visit: Payer: MEDICAID

## 2024-07-29 ENCOUNTER — Ambulatory Visit: Payer: MEDICAID | Admitting: Rheumatology

## 2024-07-29 ENCOUNTER — Encounter: Payer: Self-pay | Admitting: Rheumatology
